# Patient Record
Sex: Male | Born: 1980 | Race: Black or African American | Hispanic: No | Smoking: Former smoker
Health system: Southern US, Community
[De-identification: ages and names within clinical notes are randomized; demographics above are authoritative.]

## PROBLEM LIST (undated history)

## (undated) DIAGNOSIS — I471 Supraventricular tachycardia, unspecified: Secondary | ICD-10-CM

## (undated) DIAGNOSIS — I4891 Unspecified atrial fibrillation: Secondary | ICD-10-CM

## (undated) DIAGNOSIS — D72819 Decreased white blood cell count, unspecified: Secondary | ICD-10-CM

## (undated) DIAGNOSIS — R519 Headache, unspecified: Secondary | ICD-10-CM

## (undated) DIAGNOSIS — Z7251 High risk heterosexual behavior: Secondary | ICD-10-CM

## (undated) DIAGNOSIS — F419 Anxiety disorder, unspecified: Secondary | ICD-10-CM

## (undated) DIAGNOSIS — R112 Nausea with vomiting, unspecified: Secondary | ICD-10-CM

## (undated) DIAGNOSIS — K449 Diaphragmatic hernia without obstruction or gangrene: Secondary | ICD-10-CM

## (undated) DIAGNOSIS — F329 Major depressive disorder, single episode, unspecified: Secondary | ICD-10-CM

## (undated) DIAGNOSIS — F410 Panic disorder [episodic paroxysmal anxiety] without agoraphobia: Secondary | ICD-10-CM

## (undated) DIAGNOSIS — K219 Gastro-esophageal reflux disease without esophagitis: Secondary | ICD-10-CM

## (undated) DIAGNOSIS — F32A Depression, unspecified: Secondary | ICD-10-CM

## (undated) DIAGNOSIS — D432 Neoplasm of uncertain behavior of brain, unspecified: Secondary | ICD-10-CM

## (undated) DIAGNOSIS — R5383 Other fatigue: Secondary | ICD-10-CM

## (undated) DIAGNOSIS — R5381 Other malaise: Secondary | ICD-10-CM

## (undated) DIAGNOSIS — R109 Unspecified abdominal pain: Secondary | ICD-10-CM

## (undated) DIAGNOSIS — Z9889 Other specified postprocedural states: Secondary | ICD-10-CM

## (undated) HISTORY — DX: Other fatigue: R53.83

## (undated) HISTORY — DX: Unspecified abdominal pain: R10.9

## (undated) HISTORY — DX: Other specified postprocedural states: R11.2

## (undated) HISTORY — DX: Nausea with vomiting, unspecified: Z98.890

## (undated) HISTORY — DX: Decreased white blood cell count, unspecified: D72.819

## (undated) HISTORY — DX: Other malaise: R53.81

## (undated) HISTORY — DX: High risk heterosexual behavior: Z72.51

## (undated) HISTORY — DX: Supraventricular tachycardia, unspecified: I47.10

## (undated) HISTORY — DX: Anxiety disorder, unspecified: F41.9

## (undated) HISTORY — DX: Neoplasm of uncertain behavior of brain, unspecified: D43.2

## (undated) HISTORY — DX: Depression, unspecified: F32.A

## (undated) HISTORY — DX: Major depressive disorder, single episode, unspecified: F32.9

## (undated) HISTORY — DX: Panic disorder (episodic paroxysmal anxiety): F41.0

## (undated) HISTORY — DX: Headache, unspecified: R51.9

## (undated) HISTORY — DX: Supraventricular tachycardia: I47.1

---

## 2003-07-23 ENCOUNTER — Emergency Department (HOSPITAL_COMMUNITY): Admission: EM | Admit: 2003-07-23 | Discharge: 2003-07-23 | Payer: Self-pay | Admitting: Emergency Medicine

## 2005-05-10 ENCOUNTER — Emergency Department (HOSPITAL_COMMUNITY): Admission: EM | Admit: 2005-05-10 | Discharge: 2005-05-10 | Payer: Self-pay | Admitting: Emergency Medicine

## 2005-05-10 ENCOUNTER — Ambulatory Visit: Payer: Self-pay | Admitting: Internal Medicine

## 2005-07-12 ENCOUNTER — Emergency Department (HOSPITAL_COMMUNITY): Admission: EM | Admit: 2005-07-12 | Discharge: 2005-07-12 | Payer: Self-pay | Admitting: Family Medicine

## 2006-01-19 ENCOUNTER — Ambulatory Visit: Payer: Self-pay | Admitting: Internal Medicine

## 2006-01-26 ENCOUNTER — Ambulatory Visit: Payer: Self-pay | Admitting: Gastroenterology

## 2006-07-16 ENCOUNTER — Emergency Department (HOSPITAL_COMMUNITY): Admission: EM | Admit: 2006-07-16 | Discharge: 2006-07-16 | Payer: Self-pay | Admitting: Emergency Medicine

## 2006-07-21 ENCOUNTER — Emergency Department (HOSPITAL_COMMUNITY): Admission: EM | Admit: 2006-07-21 | Discharge: 2006-07-21 | Payer: Self-pay | Admitting: Emergency Medicine

## 2006-08-07 ENCOUNTER — Emergency Department (HOSPITAL_COMMUNITY): Admission: EM | Admit: 2006-08-07 | Discharge: 2006-08-07 | Payer: Self-pay | Admitting: *Deleted

## 2008-03-11 ENCOUNTER — Ambulatory Visit (HOSPITAL_COMMUNITY): Admission: RE | Admit: 2008-03-11 | Discharge: 2008-03-11 | Payer: Self-pay | Admitting: Emergency Medicine

## 2008-03-11 ENCOUNTER — Emergency Department (HOSPITAL_COMMUNITY): Admission: EM | Admit: 2008-03-11 | Discharge: 2008-03-11 | Payer: Self-pay | Admitting: Emergency Medicine

## 2009-07-26 ENCOUNTER — Emergency Department (HOSPITAL_COMMUNITY): Admission: EM | Admit: 2009-07-26 | Discharge: 2009-07-26 | Payer: Self-pay | Admitting: Emergency Medicine

## 2009-08-07 ENCOUNTER — Emergency Department (HOSPITAL_COMMUNITY): Admission: EM | Admit: 2009-08-07 | Discharge: 2009-08-07 | Payer: Self-pay | Admitting: Emergency Medicine

## 2010-12-25 ENCOUNTER — Encounter: Payer: Self-pay | Admitting: Internal Medicine

## 2011-04-22 NOTE — Assessment & Plan Note (Signed)
[facility]                                   ON-CALL NOTE   Anthony Henderson, Anthony Henderson                       MRN:          [id number]  DATE:08/07/2006                            DOB:          1981-05-24    OUTPATIENT ON CALL NOTE:  Date of interaction August 07, 2006 at 8:23 a.m.  Phone number is [phone] and the caller was Anthony Henderson who is the mother,  although I actually spoke to Anthony Henderson as well.  Patient has nausea,  vomiting, diarrhea.  Has had that since late last night.  Now having green  fluid minimally from both vomiting and diarrhea.  Feels bad, has mild  fevers.  Ate what he thinks was an old pie last night.  Took Phenergan a  little while ago without affect.   ASSESSMENT:  Possible dehydration with probably gastroenteritis.   PLAN:  Feel he probably ought to be seen in the emergency room to be  evaluated and may need intravenous fluids and certainly will need at least  internal medicine if not intravenous medication for nausea.   PRIMARY CARE PHYSICIAN:  Dr.  Alwyn Henderson.  Home office is [place].                                   Arta Silence, MD   RNS/MedQ  DD:  08/07/2006  DT:  08/07/2006  Job #:  [id number]   cc:   Anthony Henderson. Anthony Ren, MD,FACP,FCCP

## 2011-05-10 ENCOUNTER — Inpatient Hospital Stay (INDEPENDENT_AMBULATORY_CARE_PROVIDER_SITE_OTHER)
Admission: RE | Admit: 2011-05-10 | Discharge: 2011-05-10 | Disposition: A | Discharge status: Home / Self Care | Payer: Self-pay | Source: Ambulatory Visit | Attending: Family Medicine | Admitting: Family Medicine

## 2011-05-10 DIAGNOSIS — J029 Acute pharyngitis, unspecified: Secondary | ICD-10-CM

## 2011-05-10 LAB — POCT RAPID STREP A: Streptococcus, Group A Screen (Direct): NEGATIVE

## 2012-01-09 ENCOUNTER — Emergency Department (HOSPITAL_COMMUNITY)
Admission: EM | Admit: 2012-01-09 | Discharge: 2012-01-09 | Disposition: A | Discharge status: Home / Self Care | Payer: Self-pay | Attending: Emergency Medicine | Admitting: Emergency Medicine

## 2012-01-09 ENCOUNTER — Encounter (HOSPITAL_COMMUNITY): Payer: Self-pay

## 2012-01-09 ENCOUNTER — Other Ambulatory Visit: Payer: Self-pay

## 2012-01-09 ENCOUNTER — Emergency Department (HOSPITAL_COMMUNITY): Payer: Self-pay

## 2012-01-09 DIAGNOSIS — R6883 Chills (without fever): Secondary | ICD-10-CM | POA: Insufficient documentation

## 2012-01-09 DIAGNOSIS — R002 Palpitations: Secondary | ICD-10-CM | POA: Insufficient documentation

## 2012-01-09 DIAGNOSIS — F17200 Nicotine dependence, unspecified, uncomplicated: Secondary | ICD-10-CM | POA: Insufficient documentation

## 2012-01-09 DIAGNOSIS — R0789 Other chest pain: Secondary | ICD-10-CM | POA: Insufficient documentation

## 2012-01-09 DIAGNOSIS — I471 Supraventricular tachycardia: Secondary | ICD-10-CM

## 2012-01-09 DIAGNOSIS — I498 Other specified cardiac arrhythmias: Secondary | ICD-10-CM | POA: Insufficient documentation

## 2012-01-09 LAB — CBC
MCH: 30.5 pg (ref 26.0–34.0)
MCV: 88.5 fL (ref 78.0–100.0)
Platelets: 232 10*3/uL (ref 150–400)
RBC: 4.79 MIL/uL (ref 4.22–5.81)
RDW: 11.7 % (ref 11.5–15.5)

## 2012-01-09 LAB — DIFFERENTIAL
Eosinophils Absolute: 0 10*3/uL (ref 0.0–0.7)
Eosinophils Relative: 1 % (ref 0–5)
Lymphs Abs: 1.1 10*3/uL (ref 0.7–4.0)

## 2012-01-09 LAB — POCT I-STAT, CHEM 8
Creatinine, Ser: 0.9 mg/dL (ref 0.50–1.35)
Hemoglobin: 15 g/dL (ref 13.0–17.0)
Sodium: 139 mEq/L (ref 135–145)
TCO2: 24 mmol/L (ref 0–100)

## 2012-01-09 MED ORDER — LORAZEPAM 1 MG PO TABS: 1.0000 mg | ORAL_TABLET | Freq: Three times a day (TID) | ORAL | Status: AC | PRN

## 2012-01-09 MED ORDER — FENTANYL CITRATE 0.05 MG/ML IJ SOLN
50.0000 ug | Freq: Once | INTRAMUSCULAR | Status: AC
  Administered 2012-01-09: 50 ug via INTRAVENOUS
  Filled 2012-01-09 (×2): qty 2

## 2012-01-09 MED ORDER — ONDANSETRON HCL 4 MG/2ML IJ SOLN
4.0000 mg | Freq: Once | INTRAMUSCULAR | Status: AC
  Administered 2012-01-09: 4 mg via INTRAVENOUS
  Filled 2012-01-09: qty 2

## 2012-01-09 NOTE — ED Notes (Signed)
Pt advised of plan at discharge. No complaints at present except feeling a bit anxious. meds given. Pt given snack and drink at discharge. Ambulatory out of dept.

## 2012-01-09 NOTE — ED Provider Notes (Signed)
History     CSN: [id number]  Arrival date & time 01/09/12  1330   First MD Initiated Contact with Patient 01/09/12 1348      3:11 PM HPI Patient reports he was working on his car when he suddenly became very anxious and his heart began to beat extremely fast. States he tried to smoke cigarettes to relief of his anxiety. Reports he was unable to control his heart rate with relaxation. States having intermittent episodes of palpitations in the past but they would usually resolve after trying to relax. States this  episode was persistent and began to cause chest discomfort. States his chest discomfort resolved and palpitations are decreasing. States he continues to have chills and shakes. Denies shortness of breath. Denies history of known heart disease, sudden death at an early age in family, hypertension, hyperlipidemia, diabetes. Patient is a 31 y.o. male presenting with palpitations. The history is provided by the patient.  Palpitations  This is a new problem. The current episode started 1 to 2 hours ago. The problem occurs constantly. The problem has been gradually improving. The problem is associated with an unknown factor. Associated symptoms include chest pain. Pertinent negatives include no diaphoresis, no fever, no numbness, no abdominal pain, no nausea, no vomiting, no headaches, no back pain, no dizziness, no weakness, no cough and no shortness of breath. He has tried nothing for the symptoms.    No past medical history on file.  No past surgical history on file.  No family history on file.  History  Substance Use Topics  . Smoking status: Current Everyday Smoker  . Smokeless tobacco: Not on file  . Alcohol Use:       Review of Systems  Constitutional: Positive for chills. Negative for fever, diaphoresis and fatigue.  HENT: Negative for sore throat.   Respiratory: Negative for cough and shortness of breath.   Cardiovascular: Positive for chest pain and palpitations.    Gastrointestinal: Negative for nausea, vomiting and abdominal pain.  Musculoskeletal: Negative for back pain.  Neurological: Negative for dizziness, weakness, numbness and headaches.  Psychiatric/Behavioral: Positive for agitation.  All other systems reviewed and are negative.    Allergies  Penicillins  Home Medications  No current outpatient prescriptions on file.  BP 121/89  Pulse 148  Temp(Src) 98.3 F (36.8 C) (Oral)  Resp 20  SpO2 99%  Physical Exam  Constitutional: He is oriented to person, place, and time. He appears well-developed and well-nourished.  HENT:  Head: Normocephalic and atraumatic.  Eyes: Conjunctivae are normal. Pupils are equal, round, and reactive to light.  Neck: Normal range of motion. Neck supple.  Cardiovascular: Regular rhythm and normal heart sounds.  Tachycardia present.  Exam reveals no gallop and no friction rub.   No murmur heard. Pulmonary/Chest: Effort normal and breath sounds normal. He has no wheezes. He has no rales. He exhibits no tenderness.  Abdominal: Soft. Bowel sounds are normal.  Neurological: He is alert and oriented to person, place, and time.  Skin: Skin is warm and dry. No rash noted. No erythema. No pallor.  Psychiatric: His behavior is normal. His mood appears anxious.    ED Course  Procedures  Results for orders placed during the hospital encounter of 01/09/12  CBC      Component Value Range   WBC 3.9 (*) 4.0 - 10.5 (K/uL)   RBC 4.79  4.22 - 5.81 (MIL/uL)   Hemoglobin 14.6  13.0 - 17.0 (g/dL)   HCT 08.6  57.8 -  52.0 (%)   MCV 88.5  78.0 - 100.0 (fL)   MCH 30.5  26.0 - 34.0 (pg)   MCHC 34.4  30.0 - 36.0 (g/dL)   RDW 40.9  81.1 - 91.4 (%)   Platelets 232  150 - 400 (K/uL)  DIFFERENTIAL      Component Value Range   Neutrophils Relative 64  43 - 77 (%)   Neutro Abs 2.5  1.7 - 7.7 (K/uL)   Lymphocytes Relative 29  12 - 46 (%)   Lymphs Abs 1.1  0.7 - 4.0 (K/uL)   Monocytes Relative 6  3 - 12 (%)   Monocytes  Absolute 0.3  0.1 - 1.0 (K/uL)   Eosinophils Relative 1  0 - 5 (%)   Eosinophils Absolute 0.0  0.0 - 0.7 (K/uL)   Basophils Relative 1  0 - 1 (%)   Basophils Absolute 0.0  0.0 - 0.1 (K/uL)  POCT I-STAT, CHEM 8      Component Value Range   Sodium 139  135 - 145 (mEq/L)   Potassium 3.8  3.5 - 5.1 (mEq/L)   Chloride 104  96 - 112 (mEq/L)   BUN 10  6 - 23 (mg/dL)   Creatinine, Ser 7.82  0.50 - 1.35 (mg/dL)   Glucose, Bld 956 (*) 70 - 99 (mg/dL)   Calcium, Ion 2.13  0.86 - 1.32 (mmol/L)   TCO2 24  0 - 100 (mmol/L)   Hemoglobin 15.0  13.0 - 17.0 (g/dL)   HCT 57.8  46.9 - 62.9 (%)   Dg Chest 2 View  01/09/2012  *RADIOLOGY REPORT*  Clinical Data: Chest pain.  Tachycardia.  CHEST - 2 VIEW  Comparison: 03/11/2008  Findings: Heart size is normal.  Mediastinal shadows are normal. Lungs are clear.  The vascularity is normal.  No effusions.  No significant bony finding. Minimal spinal curvature.  IMPRESSION: No active disease  Original Report Authenticated By: Thomasenia Sales, M.D.    ED ECG REPORT   Date: 01/09/2012  EKG Time: 4:19 PM  Rate: 145  Rhythm: supraventricular tachycardia (SVT)  Axis: NML  Intervals:none  ST&T Change: NMl  Narrative Interpretation: SVT    4:19 PM  rechecked rhythm on monitor indicates NML sinus. BP 116/89, PR 70, O2 sats 100%       MDM  Discussed with family and patient that we have evaluated for several abnormalities that could cause symptoms. Differential diagnosis included cardiac disease, pneumothorax, aortic dissection, pneumonia. Advised patient to family that these appear to be ruled out however did recommend close followup since SVTs needs further evaluation.  Labs within normal limits. Vital signs improved no longer tachycardic. no longer symptomatic. Discussed with patient and mother differential diagnoses and what we have ruled out a. Advised close followup with cardiologist this week for further evaluation of SVT. Advised immediate return to the  emergency department for worsening symptoms. Patient and family agree with this and are ready for discharge.   4:29 PM Spoke with Dr. Mayford Knife in passing who quickly reviewed EKG and case for me. Recommends f/u with [facility] and suggested Dr. Ladona Ridgel.  Thomasene Lot, PA-C 01/10/12 [phone]

## 2012-01-09 NOTE — ED Notes (Signed)
Pt complains of 30 min onset of chest pain and heart racing, chills, and shaking.

## 2012-01-12 NOTE — ED Provider Notes (Signed)
Medical screening examination/treatment/procedure(s) were performed by non-physician practitioner and as supervising physician I was immediately available for consultation/collaboration.  Geoffery Lyons, MD 01/12/12 [phone]

## 2012-02-06 ENCOUNTER — Ambulatory Visit: Payer: Self-pay | Admitting: Internal Medicine

## 2013-04-09 ENCOUNTER — Emergency Department (HOSPITAL_COMMUNITY)
Admission: EM | Admit: 2013-04-09 | Discharge: 2013-04-09 | Disposition: A | Discharge status: Home / Self Care | Payer: Medicaid - Out of State | Attending: Emergency Medicine | Admitting: Emergency Medicine

## 2013-04-09 ENCOUNTER — Encounter (HOSPITAL_COMMUNITY): Payer: Self-pay | Admitting: Emergency Medicine

## 2013-04-09 DIAGNOSIS — K029 Dental caries, unspecified: Secondary | ICD-10-CM | POA: Insufficient documentation

## 2013-04-09 DIAGNOSIS — F17200 Nicotine dependence, unspecified, uncomplicated: Secondary | ICD-10-CM | POA: Insufficient documentation

## 2013-04-09 DIAGNOSIS — K0889 Other specified disorders of teeth and supporting structures: Secondary | ICD-10-CM

## 2013-04-09 DIAGNOSIS — K089 Disorder of teeth and supporting structures, unspecified: Secondary | ICD-10-CM | POA: Insufficient documentation

## 2013-04-09 MED ORDER — CLINDAMYCIN HCL 150 MG PO CAPS: 150.0000 mg | ORAL_CAPSULE | Freq: Three times a day (TID) | ORAL | Status: DC

## 2013-04-09 MED ORDER — ONDANSETRON HCL 4 MG PO TABS: 4.0000 mg | ORAL_TABLET | Freq: Three times a day (TID) | ORAL | Status: DC | PRN

## 2013-04-09 MED ORDER — OXYCODONE-ACETAMINOPHEN 5-325 MG PO TABS: ORAL_TABLET | ORAL | Status: DC

## 2013-04-09 NOTE — ED Provider Notes (Signed)
Medical screening examination/treatment/procedure(s) were performed by non-physician practitioner and as supervising physician I was immediately available for consultation/collaboration.  Jones Skene, M.D.     Jones Skene, MD 04/09/13 [phone]

## 2013-04-09 NOTE — ED Notes (Signed)
PA at bedside.

## 2013-04-09 NOTE — ED Notes (Signed)
Pt complaining of pain on right side of mouth. Pt states he has taken pain medication, placed ice and warm packs on cheek, etc. Pt states nothing works. Pt stable. Pt states he is able to eat and drink. Pain 10/10. Pt states his tooth was pulled on the right side a few months ago. Pt states he did not take his antibiotics due to patient not liking taking meds.

## 2013-04-09 NOTE — ED Provider Notes (Signed)
History     CSN: [id number]  Arrival date & time 04/09/13  0557   None     Chief Complaint  Patient presents with  . Dental Pain    Top right side of mouth    (Consider location/radiation/quality/duration/timing/severity/associated sxs/prior treatment) HPI Comments: 32 y.o. Male with no significant PMHx presents today complaining of dental pain to the upper right where he had a tooth pulled about 2 months ago. Pt states it has been bothering him for about 2 weeks. Pain was controlled with ibuprofen, but has gradually worsened. He tried taking half a vicodin prescribed by the dentist when he had the tooth pulled, but that offered only minimal relief.  Pain rated at a 10/10, characterized as throbbing in nature. Patient denies fever, night sweats, chills, difficulty swallowing or opening mouth, SOB, nuchal rigidity or decreased ROM of neck.  Patient does not have a dentist and requests a resource guide at discharge.    Patient is a 32 y.o. male presenting with tooth pain.  Dental PainPrimary symptoms do not include headaches, fever, shortness of breath or sore throat.  Additional symptoms do not include: facial swelling, trouble swallowing and drooling.    History reviewed. No pertinent past medical history.  History reviewed. No pertinent past surgical history.  History reviewed. No pertinent family history.  History  Substance Use Topics  . Smoking status: Current Every Day Smoker  . Smokeless tobacco: Not on file  . Alcohol Use: No      Review of Systems  Constitutional: Negative for fever.  HENT: Positive for dental problem. Negative for sore throat, facial swelling, drooling, trouble swallowing, neck pain, neck stiffness and voice change.   Eyes: Negative for photophobia and visual disturbance.  Respiratory: Negative for shortness of breath.   Cardiovascular: Negative for chest pain.  Gastrointestinal: Negative for nausea and vomiting.  Musculoskeletal: Negative for  back pain.  Skin: Negative for rash.  Neurological: Negative for weakness, light-headedness, numbness and headaches.  Psychiatric/Behavioral: The patient is not nervous/anxious.     Allergies  Penicillins  Home Medications  No current outpatient prescriptions on file.  BP 121/81  Pulse 62  Temp(Src) 97.9 F (36.6 C) (Oral)  Resp 14  Ht 6' (1.829 m)  Wt 145 lb (65.772 kg)  BMI 19.66 kg/m2  SpO2 100%  Physical Exam  Nursing note and vitals reviewed. Constitutional: He is oriented to person, place, and time. He appears well-developed and well-nourished. No distress.  HENT:  Head: Normocephalic and atraumatic. No trismus in the jaw.  Mouth/Throat: Dental caries present. No dental abscesses. No tonsillar abscesses.    Generally poor dentition, dental caries, several missing teeth. Some redness and swelling at gumline at point of tenderness. No pus, no gross abscess, no bleeding.   Eyes: Conjunctivae and EOM are normal.  Neck: Normal range of motion. Neck supple.  No meningeal signs  Cardiovascular: Normal rate, regular rhythm and normal heart sounds.  Exam reveals no gallop and no friction rub.   No murmur heard. Pulmonary/Chest: Effort normal and breath sounds normal. No respiratory distress. He has no wheezes. He has no rales. He exhibits no tenderness.  Abdominal: Soft. Bowel sounds are normal. He exhibits no distension. There is no tenderness. There is no rebound and no guarding.  Musculoskeletal: Normal range of motion. He exhibits no edema and no tenderness.  Neurological: He is alert and oriented to person, place, and time. No cranial nerve deficit.  Skin: Skin is warm and dry. He is not  diaphoretic. No erythema.  Psychiatric: He has a normal mood and affect.    ED Course  Procedures (including critical care time)  Labs Reviewed - No data to display No results found.  New Prescriptions   CLINDAMYCIN (CLEOCIN) 150 MG CAPSULE    Take 1 capsule (150 mg total) by  mouth 3 (three) times daily. May dispense as 150mg  capsules   ONDANSETRON (ZOFRAN) 4 MG TABLET    Take 1 tablet (4 mg total) by mouth every 8 (eight) hours as needed for nausea.   OXYCODONE-ACETAMINOPHEN (PERCOCET) 5-325 MG PER TABLET    Take one or two tablets every 4-6 hours as needed for pain    1. Pain, dental       MDM  Patient with dental pain.  No gross abscess.  Exam unconcerning for Ludwig's angina or spread of infection.  Will treat with clindamycin (pcn allergy) and pain medicine.  Urged patient to follow-up with dentist.  Provided resource material. Discussed options for treatment with pt who understands and is in agreement with discharge plan.    Glade Nurse, [place] 04/09/13 [phone]

## 2013-04-09 NOTE — ED Notes (Signed)
Family at bedside. 

## 2013-04-12 ENCOUNTER — Encounter (HOSPITAL_COMMUNITY): Payer: Self-pay | Admitting: Adult Health

## 2013-04-12 ENCOUNTER — Emergency Department (HOSPITAL_COMMUNITY)
Admission: EM | Admit: 2013-04-12 | Discharge: 2013-04-12 | Disposition: A | Discharge status: Home / Self Care | Payer: Medicaid - Out of State | Attending: Emergency Medicine | Admitting: Emergency Medicine

## 2013-04-12 DIAGNOSIS — R110 Nausea: Secondary | ICD-10-CM | POA: Insufficient documentation

## 2013-04-12 DIAGNOSIS — F17200 Nicotine dependence, unspecified, uncomplicated: Secondary | ICD-10-CM | POA: Insufficient documentation

## 2013-04-12 DIAGNOSIS — K029 Dental caries, unspecified: Secondary | ICD-10-CM | POA: Insufficient documentation

## 2013-04-12 DIAGNOSIS — G8929 Other chronic pain: Secondary | ICD-10-CM | POA: Insufficient documentation

## 2013-04-12 DIAGNOSIS — Z880 Allergy status to penicillin: Secondary | ICD-10-CM | POA: Insufficient documentation

## 2013-04-12 DIAGNOSIS — K089 Disorder of teeth and supporting structures, unspecified: Secondary | ICD-10-CM | POA: Insufficient documentation

## 2013-04-12 DIAGNOSIS — K0889 Other specified disorders of teeth and supporting structures: Secondary | ICD-10-CM

## 2013-04-12 MED ORDER — BUPIVACAINE-EPINEPHRINE PF 0.5-1:200000 % IJ SOLN
1.8000 mL | Freq: Once | INTRAMUSCULAR | Status: AC
  Administered 2013-04-12: 0.75 mg

## 2013-04-12 NOTE — ED Notes (Signed)
Presents with 2-3 weeks of dental pain from a tooth extraction. Requesting numbing shot to get him through the eevning. Oral pain medication is not helping/

## 2013-04-12 NOTE — ED Provider Notes (Signed)
History    This chart was scribed for non-physician practitioner Anthony Henderson working with Anthony Human, MD by Quintella Reichert, ED Scribe. This patient was seen in room TR06C/TR06C and the patient's care was started at 10:52 PM .   CSN: [id number]  Arrival date & time 04/12/13  2022       Chief Complaint  Patient presents with  . Dental Pain     Patient is a 32 y.o. male presenting with tooth pain. The history is provided by the patient. No language interpreter was used.  Dental PainPrimary symptoms do not include fever.    HPI Comments: Anthony Henderson is a 32 y.o. male who presents to the Emergency Department complaining of constant, moderate, gradually-worsening upper right dental pain that began 2-3 weeks ago in the area of a tooth extraction.  Pt states he had a tooth extracted 2 months ago, and that subsequent pain was controlled with ibuprofen for over a month before gradually becoming more severe.  In the past 3 weeks he has tried Advil, Vicodin and Percocet, all of which failed to relieve pain but induced mild nausea.  Pt states pain is not relieved by anything he has tried.  He has an appointment with his oral surgeon on Monday, and is asking for a numbing shot to help him get through the next few days.  He does not want further pain medication.  Pt denies fevers, chills, emesis, or any other associated symptoms.  He is on clindamycin for possible dental infection.   History reviewed. No pertinent past medical history.  History reviewed. No pertinent past surgical history.  History reviewed. No pertinent family history.  History  Substance Use Topics  . Smoking status: Current Every Day Smoker  . Smokeless tobacco: Not on file  . Alcohol Use: No      Review of Systems  Constitutional: Negative for fever and chills.  HENT: Positive for dental problem.   Gastrointestinal: Positive for nausea. Negative for vomiting.  All other systems reviewed and are  negative.    Allergies  Penicillins  Home Medications   Current Outpatient Rx  Name  Route  Sig  Dispense  Refill  . clindamycin (CLEOCIN) 150 MG capsule   Oral   Take 150 mg by mouth 3 (three) times daily. Start date 04/09/13; Duration unknown         . HYDROcodone-acetaminophen (VICODIN) 5-500 MG per tablet   Oral   Take 1 tablet by mouth every 6 (six) hours as needed for pain.         Marland Kitchen ibuprofen (ADVIL,MOTRIN) 200 MG tablet   Oral   Take 200 mg by mouth every 6 (six) hours as needed for pain.         Marland Kitchen ondansetron (ZOFRAN) 4 MG tablet   Oral   Take 4 mg by mouth every 8 (eight) hours as needed for nausea.         Marland Kitchen oxyCODONE-acetaminophen (PERCOCET/ROXICET) 5-325 MG per tablet   Oral   Take 1-2 tablets by mouth every 4 (four) hours as needed for pain.           BP 114/77  Pulse 60  Temp(Src) 98.2 F (36.8 C) (Oral)  Resp 18  SpO2 99%  Physical Exam  Nursing note and vitals reviewed. Constitutional: He is oriented to person, place, and time. He appears well-developed and well-nourished.  HENT:  Head: Normocephalic.  Right Ear: Tympanic membrane, external ear and ear canal normal.  Left  Ear: Tympanic membrane, external ear and ear canal normal.  Nose: Nose normal. Right sinus exhibits no maxillary sinus tenderness and no frontal sinus tenderness. Left sinus exhibits no maxillary sinus tenderness and no frontal sinus tenderness.  Mouth/Throat: Uvula is midline, oropharynx is clear and moist and mucous membranes are normal. No oral lesions. Abnormal dentition. Dental caries present. No edematous or lacerations. No oropharyngeal exudate, posterior oropharyngeal edema, posterior oropharyngeal erythema or tonsillar abscesses.    Tooth # 3 has been extracted, no erythema, induration, or area of fluctuance  Eyes: Conjunctivae are normal. Pupils are equal, round, and reactive to light. Right eye exhibits no discharge. Left eye exhibits no discharge.  Neck:  Normal range of motion. Neck supple.  Cardiovascular: Normal rate, regular rhythm, normal heart sounds and intact distal pulses.   No murmur heard. Pulmonary/Chest: Effort normal and breath sounds normal. No respiratory distress. He has no wheezes.  Abdominal: Soft. Bowel sounds are normal. He exhibits no distension. There is no tenderness.  Lymphadenopathy:    He has no cervical adenopathy.  Neurological: He is alert and oriented to person, place, and time. No cranial nerve deficit.  Skin: Skin is warm and dry. No erythema.  Psychiatric: He has a normal mood and affect.    ED Course  Dental Date/Time: 04/12/2013 11:26 PM Performed by: Dierdre Forth Authorized by: Dierdre Forth Consent: Verbal consent obtained. Risks and benefits: risks, benefits and alternatives were discussed Consent given by: patient and parent Patient understanding: patient states understanding of the procedure being performed Patient consent: the patient's understanding of the procedure matches consent given Procedure consent: procedure consent matches procedure scheduled Relevant documents: relevant documents present and verified Site marked: the operative site was marked Required items: required blood products, implants, devices, and special equipment available Patient identity confirmed: verbally with patient and arm band Time out: Immediately prior to procedure a "time out" was called to verify the correct patient, procedure, equipment, support staff and site/side marked as required. Preparation: Patient was prepped and draped in the usual sterile fashion. Local anesthesia used: yes Anesthesia: local infiltration Local anesthetic: bupivacaine 0.5% with epinephrine Anesthetic total: 0.75 ml Patient sedated: no Patient tolerance: Patient tolerated the procedure well with no immediate complications. Comments: Dental block of Tooth #3 without complication   (including critical care  time)  DIAGNOSTIC STUDIES: Oxygen Saturation is 99% on room air, normal by my interpretation.    COORDINATION OF CARE: 10:56 PM-Discussed treatment plan which includes dental block with pt at bedside and pt agreed to plan.    11:01 PM: Administered dental block.  Pt states that pain is relieved slightly.  Labs Reviewed - No data to display No results found.   1. Pain, dental   2. Chronic dental pain       MDM  DEMARI GALES presents with chronic pain.  Patient with toothache.  No gross abscess.  Exam unconcerning for Ludwig's angina or spread of infection.  Pt is already taking clindamycin for possible infection.  Urged patient to keep his appointment with the oral surgeon. Pt pain relieved with dental block.  I have also discussed reasons to return immediately to the ER.  Patient expresses understanding and agrees with plan.   I personally performed the services described in this documentation, which was scribed in my presence. The recorded information has been reviewed and is accurate.   Anthony Client Kattia Selley, PA-C 04/12/13 2330

## 2013-04-13 NOTE — ED Provider Notes (Signed)
Medical screening examination/treatment/procedure(s) were performed by non-physician practitioner and as supervising physician I was immediately available for consultation/collaboration.   Shantay Sonn III, MD 04/13/13 1259 

## 2013-04-23 ENCOUNTER — Emergency Department (INDEPENDENT_AMBULATORY_CARE_PROVIDER_SITE_OTHER)
Admission: EM | Admit: 2013-04-23 | Discharge: 2013-04-23 | Disposition: A | Discharge status: Home / Self Care | Payer: Medicaid - Out of State | Source: Home / Self Care | Attending: Family Medicine | Admitting: Family Medicine

## 2013-04-23 ENCOUNTER — Encounter (HOSPITAL_COMMUNITY): Payer: Self-pay | Admitting: Emergency Medicine

## 2013-04-23 DIAGNOSIS — F430 Acute stress reaction: Secondary | ICD-10-CM

## 2013-04-23 DIAGNOSIS — R4589 Other symptoms and signs involving emotional state: Secondary | ICD-10-CM

## 2013-04-23 MED ORDER — CLONAZEPAM 1 MG PO TABS: 1.0000 mg | ORAL_TABLET | Freq: Two times a day (BID) | ORAL | Status: DC | PRN

## 2013-04-23 NOTE — ED Provider Notes (Signed)
History     CSN: [id number]  Arrival date & time 04/23/13  1632   First MD Initiated Contact with Patient 04/23/13 1734      No chief complaint on file.   (Consider location/radiation/quality/duration/timing/severity/associated sxs/prior treatment) Patient is a 32 y.o. male presenting with anxiety. The history is provided by the patient and a parent.  Anxiety This is a chronic problem. The current episode started yesterday. The problem has been gradually improving. Associated symptoms include shortness of breath. Associated symptoms comments: S/p dental extraction on fri , worried about infection in his body. Having palp and sob and sweats, h/o panic disorder..    No past medical history on file.  No past surgical history on file.  No family history on file.  History  Substance Use Topics  . Smoking status: Current Every Day Smoker  . Smokeless tobacco: Not on file  . Alcohol Use: No      Review of Systems  Constitutional: Negative.   HENT: Negative.   Respiratory: Positive for shortness of breath.   Cardiovascular: Positive for palpitations.  Gastrointestinal: Negative.   Genitourinary: Negative.     Allergies  Penicillins  Home Medications   Current Outpatient Rx  Name  Route  Sig  Dispense  Refill  . clindamycin (CLEOCIN) 150 MG capsule   Oral   Take 150 mg by mouth 3 (three) times daily. Start date 04/09/13; Duration unknown         . clonazePAM (KLONOPIN) 1 MG tablet   Oral   Take 1 tablet (1 mg total) by mouth 2 (two) times daily as needed for anxiety.   10 tablet   0   . HYDROcodone-acetaminophen (VICODIN) 5-500 MG per tablet   Oral   Take 1 tablet by mouth every 6 (six) hours as needed for pain.         Marland Kitchen ibuprofen (ADVIL,MOTRIN) 200 MG tablet   Oral   Take 200 mg by mouth every 6 (six) hours as needed for pain.         Marland Kitchen ondansetron (ZOFRAN) 4 MG tablet   Oral   Take 4 mg by mouth every 8 (eight) hours as needed for nausea.          Marland Kitchen oxyCODONE-acetaminophen (PERCOCET/ROXICET) 5-325 MG per tablet   Oral   Take 1-2 tablets by mouth every 4 (four) hours as needed for pain.           BP 106/80  Pulse 82  Temp(Src) 98.2 F (36.8 C) (Oral)  Resp 16  SpO2 100%  Physical Exam  Nursing note and vitals reviewed. Constitutional: He is oriented to person, place, and time. He appears well-developed and well-nourished.  HENT:  Head: Normocephalic.  Right Ear: External ear normal.  Left Ear: External ear normal.  Mouth/Throat: Oropharynx is clear and moist.  Neck: Normal range of motion. Neck supple.  Cardiovascular: Normal rate and normal heart sounds.   Pulmonary/Chest: Effort normal and breath sounds normal. No respiratory distress. He has no wheezes.  Neurological: He is alert and oriented to person, place, and time.  Skin: Skin is warm and dry.  Psychiatric: He has a normal mood and affect. His behavior is normal. Judgment and thought content normal.    ED Course  Procedures (including critical care time)  Labs Reviewed - No data to display No results found.   1. Panic attack as reaction to stress       MDM  Linna Hoff, MD 04/23/13 [phone]

## 2013-04-23 NOTE — ED Notes (Signed)
Patient states he had tooth extracted last Friday. Last night he felt anxious like his heart was racing. Today he feels generalized weakness. Also wants to be checked for infection. No symptoms of infection. Just feels weak and tired. Patient is alert and oriented.

## 2013-05-02 ENCOUNTER — Encounter: Payer: Self-pay | Admitting: Family Medicine

## 2013-05-02 ENCOUNTER — Telehealth: Payer: Self-pay | Admitting: Family Medicine

## 2013-05-02 ENCOUNTER — Ambulatory Visit: Payer: Medicaid - Out of State | Attending: Family Medicine | Admitting: Family Medicine

## 2013-05-02 VITALS — BP 115/81 | HR 101 | Temp 98.4°F | Resp 19 | Wt 131.6 lb

## 2013-05-02 DIAGNOSIS — Z7251 High risk heterosexual behavior: Secondary | ICD-10-CM | POA: Insufficient documentation

## 2013-05-02 DIAGNOSIS — Z0000 Encounter for general adult medical examination without abnormal findings: Secondary | ICD-10-CM | POA: Insufficient documentation

## 2013-05-02 DIAGNOSIS — R5381 Other malaise: Secondary | ICD-10-CM | POA: Insufficient documentation

## 2013-05-02 DIAGNOSIS — Z833 Family history of diabetes mellitus: Secondary | ICD-10-CM | POA: Insufficient documentation

## 2013-05-02 DIAGNOSIS — F411 Generalized anxiety disorder: Secondary | ICD-10-CM | POA: Insufficient documentation

## 2013-05-02 LAB — CBC WITH DIFFERENTIAL/PLATELET
Basophils Absolute: 0 10*3/uL (ref 0.0–0.1)
Basophils Relative: 1 % (ref 0–1)
Eosinophils Absolute: 0 10*3/uL (ref 0.0–0.7)
Lymphs Abs: 1.9 10*3/uL (ref 0.7–4.0)
MCH: 29.8 pg (ref 26.0–34.0)
MCHC: 34.8 g/dL (ref 30.0–36.0)
Neutrophils Relative %: 40 % — ABNORMAL LOW (ref 43–77)
Platelets: 284 10*3/uL (ref 150–400)
RBC: 5.1 MIL/uL (ref 4.22–5.81)
RDW: 12.2 % (ref 11.5–15.5)

## 2013-05-02 LAB — HEMOGLOBIN A1C: Mean Plasma Glucose: 123 mg/dL — ABNORMAL HIGH (ref <117)

## 2013-05-02 LAB — COMPREHENSIVE METABOLIC PANEL
ALT: 9 U/L (ref 0–53)
Alkaline Phosphatase: 46 U/L (ref 39–117)
Sodium: 137 mEq/L (ref 135–145)
Total Bilirubin: 0.8 mg/dL (ref 0.3–1.2)
Total Protein: 7.5 g/dL (ref 6.0–8.3)

## 2013-05-02 LAB — RPR

## 2013-05-02 MED ORDER — FLUOXETINE HCL 20 MG PO TABS: 20.0000 mg | ORAL_TABLET | Freq: Every day | ORAL | Status: DC

## 2013-05-02 NOTE — Progress Notes (Signed)
Patient here to establish care Suffers from panic attacks Was seen last week at the urgent care

## 2013-05-02 NOTE — Progress Notes (Signed)
Subjective:     Patient ID: Anthony Henderson, male   DOB: 1981/01/27, 32 y.o.   MRN: [id number]  HPI Pt here to establish care. He has not had a pcp in over 10 years. He got married last October and wants to be healthy for his new family. He had been seen several times in UC and the ED for dental pain but that is resolved.  He has a h/o anxiety and has been taking klonopin for 1 week (total of 4 pills) but read about it and is worried about addiction.   He has a family history of DM2 although he himself has never been checked. He denies polyuria/polydipsia.   He feels fatigue more than he feels he should. He says sometimes the anxiety keeps him up and he is not able to sleep well.   He has had no other pertners besides his wife since they married, but he had several prior to her and never had an std check after that and before her. He has no penile d/c, sore, or other physical cause for concern.    Review of Systems per hpi     Objective:   Physical Exam  Nursing note and vitals reviewed. Constitutional: He appears well-developed and well-nourished.  HENT:  Head: Normocephalic.  Mouth/Throat: Oropharynx is clear and moist.  Cardiovascular: Normal rate, regular rhythm and normal heart sounds.   Pulmonary/Chest: Effort normal and breath sounds normal.  Abdominal: Soft.  Skin: Skin is warm.  Psychiatric: He has a normal mood and affect.       Assessment:     High risk sexual behavior - Plan: RPR, HIV Antibody, Acute Hep Panel & Hep B Surface Ab, GC/chlamydia probe amp, urine  Other malaise and fatigue - Plan: CBC with Differential, Comprehensive metabolic panel, TSH, Vitamin B12, Testosterone  Generalized anxiety disorder - Plan: FLUoxetine (PROZAC) 20 MG tablet  Family history of diabetes mellitus - Plan: Comprehensive metabolic panel, Hemoglobin A1c       Plan:     1. High risk sex beh - std check today, will f/u results, treat as indicated. Urged him to encourage wife to  also get checked.   2 - fatigue - labs as above  3 - GAD - d/c klonopin, start a trial of prozac. rtc 1 week to f/u on this. Discussed possible side effects including SI - will go to ED if severe.   4 - FH DM - a1c today  Pt to rtc 1 week to f/u on GAD with prozac and discuss lab results.

## 2013-05-02 NOTE — Patient Instructions (Signed)
Fluoxetine capsules or tablets (Depression/Mood Disorders) What is this medicine? FLUOXETINE (floo OX e teen) belongs to a class of drugs known as selective serotonin reuptake inhibitors (SSRIs). It helps to treat mood problems such as depression, obsessive compulsive disorder, and panic attacks. It can also treat certain eating disorders. This medicine may be used for other purposes; ask your health care provider or pharmacist if you have questions. What should I tell my health care provider before I take this medicine? They need to know if you have any of these conditions: -bipolar disorder or mania -diabetes -glaucoma -liver disease -psychosis -seizures -suicidal thoughts or history of attempted suicide -an unusual or allergic reaction to fluoxetine, other medicines, foods, dyes, or preservatives -pregnant or trying to get pregnant -breast-feeding How should I use this medicine? Take this medicine by mouth with a glass of water. Follow the directions on the prescription label. You can take this medicine with or without food. Take your medicine at regular intervals. Do not take it more often than directed. Do not stop taking this medicine suddenly except upon the advice of your doctor. Stopping this medicine too quickly may cause serious side effects or your condition may worsen. A special MedGuide will be given to you by the pharmacist with each prescription and refill. Be sure to read this information carefully each time. Talk to your pediatrician regarding the use of this medicine in children. While this drug may be prescribed for children as young as 7 years for selected conditions, precautions do apply. Overdosage: If you think you have taken too much of this medicine contact a poison control center or emergency room at once. NOTE: This medicine is only for you. Do not share this medicine with others. What if I miss a dose? If you miss a dose, skip the missed dose and go back to your  regular dosing schedule. Do not take double or extra doses. What may interact with this medicine? Do not take fluoxetine with any of the following medications: -other medicines containing fluoxetine, like Sarafem or Symbyax -cisapride -linezolid -MAOIs like Carbex, Eldepryl, Marplan, Nardil, and Parnate -methylene blue (injected into a vein) -pimozide -thioridazine This medicine may also interact with the following medications: -alcohol -aspirin and aspirin-like medicines -carbamazepine -certain medicines for depression, anxiety, or psychotic disturbances -certain medicines for migraine headaches like almotriptan, eletriptan, frovatriptan, naratriptan, rizatriptan, sumatriptan, zolmitriptan -digoxin -diuretics -fentanyl -flecainide -furazolidone -isoniazid -lithium -medicines for sleep -medicines that treat or prevent blood clots like warfarin, enoxaparin, and dalteparin -NSAIDs, medicines for pain and inflammation, like ibuprofen or naproxen -phenytoin -procarbazine -propafenone -rasagiline -ritonavir -supplements like St. John's wort, kava kava, valerian -tramadol -tryptophan -vinblastine This list may not describe all possible interactions. Give your health care provider a list of all the medicines, herbs, non-prescription drugs, or dietary supplements you use. Also tell them if you smoke, drink alcohol, or use illegal drugs. Some items may interact with your medicine. What should I watch for while using this medicine? Tell your doctor if your symptoms do not get better or if they get worse. Visit your doctor or health care professional for regular checks on your progress. Because it may take several weeks to see the full effects of this medicine, it is important to continue your treatment as prescribed by your doctor. Patients and their families should watch out for new or worsening thoughts of suicide or depression. Also watch out for sudden changes in feelings such as  feeling anxious, agitated, panicky, irritable, hostile, aggressive, impulsive, severely restless,   overly excited and hyperactive, or not being able to sleep. If this happens, especially at the beginning of treatment or after a change in dose, call your health care professional. Anthony Henderson may get drowsy or dizzy. Do not drive, use machinery, or do anything that needs mental alertness until you know how this medicine affects you. Do not stand or sit up quickly, especially if you are an older patient. This reduces the risk of dizzy or fainting spells. Alcohol may interfere with the effect of this medicine. Avoid alcoholic drinks. Your mouth may get dry. Chewing sugarless gum or sucking hard candy, and drinking plenty of water may help. Contact your doctor if the problem does not go away or is severe. This medicine may affect blood sugar levels. If you have diabetes, check with your doctor or health care professional before you change your diet or the dose of your diabetic medicine. What side effects may I notice from receiving this medicine? Side effects that you should report to your doctor or health care professional as soon as possible: -allergic reactions like skin rash, itching or hives, swelling of the face, lips, or tongue -breathing problems -confusion -fast or irregular heart rate, palpitations -flu-like fever, chills, cough, muscle or joint aches and pains -seizures -suicidal thoughts or other mood changes -tremors -trouble sleeping -unusual bleeding or bruising -unusually tired or weak -vomiting Side effects that usually do not require medical attention (report to your doctor or health care professional if they continue or are bothersome): -blurred vision -change in sex drive or performance -diarrhea -dry mouth -flushing -headache -increased or decreased appetite -nausea -sweating This list may not describe all possible side effects. Call your doctor for medical advice about side  effects. You may report side effects to FDA at 1-800-FDA-1088. Where should I keep my medicine? Keep out of the reach of children. Store at room temperature between 15 and 30 degrees C (59 and 86 degrees F). Throw away any unused medicine after the expiration date. NOTE: This sheet is a summary. It may not cover all possible information. If you have questions about this medicine, talk to your doctor, pharmacist, or health care provider.  2013, [employer]. (04/06/2012 7:31:49 PM)

## 2013-05-02 NOTE — Telephone Encounter (Signed)
05/02/13 Instructed patient on medication concern he had regarding  Fluoxetine. P.Rosezetta Balderston,RN BSN [facility]

## 2013-05-02 NOTE — Telephone Encounter (Signed)
Pt wondering if he can take both anxiety meds.  Tried asking him which meds it was he said he would rather discuss it with clinician.

## 2013-05-03 ENCOUNTER — Telehealth: Payer: Self-pay | Admitting: *Deleted

## 2013-05-03 LAB — ACUTE HEP PANEL AND HEP B SURFACE AB
Hep B C IgM: NEGATIVE
Hepatitis B Surface Ag: NEGATIVE

## 2013-05-03 NOTE — Telephone Encounter (Signed)
05/03/13 Patient informed that lab results are not back. P.Destine Zirkle,RN

## 2013-05-03 NOTE — Telephone Encounter (Signed)
Pt calling about results for blood work.

## 2013-05-04 LAB — GC/CHLAMYDIA PROBE AMP, URINE: Chlamydia, Swab/Urine, PCR: NEGATIVE

## 2013-05-06 ENCOUNTER — Telehealth: Payer: Self-pay | Admitting: *Deleted

## 2013-05-06 NOTE — Progress Notes (Signed)
05/06/13 Patient made aware of labs all came back essentially fine per Dr. Lucretia Roers .STD work up all negative.  A1C indicates prediabetic and B12 is low end normal. Will discus  With MD on Friday which is patient scheduled appointment. P.Ameisha Mcclellan,RN

## 2013-05-06 NOTE — Telephone Encounter (Signed)
05/06/13 Patient informed of lab results all eseentially fine. STD workup all negative. A1C indicates prediabetic  And B12 is low end normal. Patient will discuss with MD on Friday on schedule appointment. P.Rahcel Shutes,RN

## 2013-05-10 ENCOUNTER — Telehealth: Payer: Self-pay | Admitting: *Deleted

## 2013-05-10 ENCOUNTER — Ambulatory Visit: Payer: Medicaid - Out of State | Attending: Family Medicine | Admitting: Internal Medicine

## 2013-05-10 VITALS — BP 102/76 | HR 87 | Temp 98.3°F | Resp 17 | Wt 128.6 lb

## 2013-05-10 DIAGNOSIS — F419 Anxiety disorder, unspecified: Secondary | ICD-10-CM | POA: Insufficient documentation

## 2013-05-10 DIAGNOSIS — F418 Other specified anxiety disorders: Secondary | ICD-10-CM | POA: Insufficient documentation

## 2013-05-10 DIAGNOSIS — F341 Dysthymic disorder: Secondary | ICD-10-CM | POA: Insufficient documentation

## 2013-05-10 DIAGNOSIS — F329 Major depressive disorder, single episode, unspecified: Secondary | ICD-10-CM | POA: Insufficient documentation

## 2013-05-10 MED ORDER — CLONAZEPAM 1 MG PO TABS: 1.0000 mg | ORAL_TABLET | Freq: Two times a day (BID) | ORAL | Status: DC | PRN

## 2013-05-10 NOTE — Progress Notes (Signed)
Patient ID: Anthony Henderson, male   DOB: [birth year], 32 y.o.   MRN: [id number]  CC: Followup visit for anxiety and depression  HPI: 32 year old male with past medical history of anxiety and depression who presented to clinic for followup. Patient reports symptoms are being controlled with clonazepam only and not with fluoxetine. Patient reports taking Clonopin every 2-3 days for shortness of breath or palpitations. At this time patient is feeling comfortable with no shortness of breath and no chest pain. No abdominal pain, nausea or vomiting.  Allergies  Allergen Reactions  . Penicillins Hives   History reviewed. No pertinent past medical history. Current Outpatient Prescriptions on File Prior to Visit  Medication Sig Dispense Refill  . clindamycin (CLEOCIN) 150 MG capsule Take 150 mg by mouth 3 (three) times daily. Start date 04/09/13; Duration unknown      . clonazePAM (KLONOPIN) 1 MG tablet Take 1 tablet (1 mg total) by mouth 2 (two) times daily as needed for anxiety.  10 tablet  0  . FLUoxetine (PROZAC) 20 MG tablet Take 1 tablet (20 mg total) by mouth daily.  10 tablet  0  . HYDROcodone-acetaminophen (VICODIN) 5-500 MG per tablet Take 1 tablet by mouth every 6 (six) hours as needed for pain.      Marland Kitchen ibuprofen (ADVIL,MOTRIN) 200 MG tablet Take 200 mg by mouth every 6 (six) hours as needed for pain.      Marland Kitchen ondansetron (ZOFRAN) 4 MG tablet Take 4 mg by mouth every 8 (eight) hours as needed for nausea.      Marland Kitchen oxyCODONE-acetaminophen (PERCOCET/ROXICET) 5-325 MG per tablet Take 1-2 tablets by mouth every 4 (four) hours as needed for pain.       No current facility-administered medications on file prior to visit.   No family history on file. History   Social History  . Marital Status: Single    Spouse Name: N/A    Number of Children: N/A  . Years of Education: N/A   Occupational History  . Not on file.   Social History Main Topics  . Smoking status: Current Every Day Smoker  .  Smokeless tobacco: Not on file  . Alcohol Use: No  . Drug Use: No  . Sexually Active: Not on file   Other Topics Concern  . Not on file   Social History Narrative  . No narrative on file   Family medical history significant for HTN, HLD  Review of Systems  Constitutional: Negative for fever, chills, diaphoresis, activity change, appetite change and fatigue.  HENT: Negative for ear pain, nosebleeds, congestion, facial swelling, rhinorrhea, neck pain, neck stiffness and ear discharge.   Eyes: Negative for pain, discharge, redness, itching and visual disturbance.  Respiratory: Negative for cough, choking, chest tightness, shortness of breath, wheezing and stridor.   Cardiovascular: Negative for chest pain, palpitations and leg swelling.  Gastrointestinal: Negative for abdominal distention.  Genitourinary: Negative for dysuria, urgency, frequency, hematuria, flank pain, decreased urine volume, difficulty urinating and dyspareunia.  Musculoskeletal: Negative for back pain, joint swelling, arthralgias and gait problem.  Neurological: Negative for dizziness, tremors, seizures, syncope, facial asymmetry, speech difficulty, weakness, light-headedness, numbness and headaches.  Hematological: Negative for adenopathy. Does not bruise/bleed easily.  Psychiatric/Behavioral: Negative for hallucinations, behavioral problems, confusion, dysphoric mood, decreased concentration and agitation.    Objective:   Filed Vitals:   05/10/13 1329  BP: 102/76  Pulse: 87  Temp: 98.3 F (36.8 C)  Resp: 17    Physical Exam  Constitutional: Appears  well-developed and well-nourished. No distress.  HENT: Normocephalic. External right and left ear normal. Oropharynx is clear and moist.  Eyes: Conjunctivae and EOM are normal. PERRLA, no scleral icterus.  Neck: Normal ROM. Neck supple. No JVD. No tracheal deviation. No thyromegaly.  CVS: RRR, S1/S2 +, no murmurs, no gallops, no carotid bruit.  Pulmonary:  Effort and breath sounds normal, no stridor, rhonchi, wheezes, rales.  Abdominal: Soft. BS +,  no distension, tenderness, rebound or guarding.  Musculoskeletal: Normal range of motion. No edema and no tenderness.  Lymphadenopathy: No lymphadenopathy noted, cervical, inguinal. Neuro: Alert. Normal reflexes, muscle tone coordination. No cranial nerve deficit. Skin: Skin is warm and dry. No rash noted. Not diaphoretic. No erythema. No pallor.  Psychiatric: Normal mood and affect. Behavior, judgment, thought content normal.   Lab Results  Component Value Date   WBC 3.9* 05/02/2013   HGB 15.2 05/02/2013   HCT 43.7 05/02/2013   MCV 85.7 05/02/2013   PLT 284 05/02/2013   Lab Results  Component Value Date   CREATININE 1.06 05/02/2013   BUN 9 05/02/2013   NA 137 05/02/2013   K 4.9 05/02/2013   CL 101 05/02/2013   CO2 29 05/02/2013    Lab Results  Component Value Date   HGBA1C 5.9* 05/02/2013   Lipid Panel  No results found for this basename: chol, trig, hdl, cholhdl, vldl, ldlcalc       Assessment and plan:   Patient Active Problem List   Diagnosis Date Noted  . Anxiety and depression 05/10/2013    Priority: Medium - Patient reports taking Klonopin which relieves the symptoms. We will stop fluoxetine as patient reports no symptom relief with this medication

## 2013-05-10 NOTE — Progress Notes (Signed)
Patient states since taking fluoxetine- he feels tired more often And makes him feel like he has a flutter in his chest area

## 2013-05-10 NOTE — Patient Instructions (Signed)
Preventive Care for Adults, Male  A healthy lifestyle and preventive care can promote health and wellness. Preventive health guidelines for men include the following key practices:  · A routine yearly physical is a good way to check with your caregiver about your health and preventative screening. It is a chance to share any concerns and updates on your health, and to receive a thorough exam.  · Visit your dentist for a routine exam and preventative care every 6 months. Brush your teeth twice a day and floss once a day. Good oral hygiene prevents tooth decay and gum disease.  · The frequency of eye exams is based on your age, health, family medical history, use of contact lenses, and other factors. Follow your caregiver's recommendations for frequency of eye exams.  · Eat a healthy diet. Foods like vegetables, fruits, whole grains, low-fat dairy products, and lean protein foods contain the nutrients you need without too many calories. Decrease your intake of foods high in solid fats, added sugars, and salt. Eat the right amount of calories for you. Get information about a proper diet from your caregiver, if necessary.  · Regular physical exercise is one of the most important things you can do for your health. Most adults should get at least 150 minutes of moderate-intensity exercise (any activity that increases your heart rate and causes you to sweat) each week. In addition, most adults need muscle-strengthening exercises on 2 or more days a week.  · Maintain a healthy weight. The body mass index (BMI) is a screening tool to identify possible weight problems. It provides an estimate of body fat based on height and weight. Your caregiver can help determine your BMI, and can help you achieve or maintain a healthy weight. For adults 20 years and older:  · A BMI below 18.5 is considered underweight.  · A BMI of 18.5 to 24.9 is normal.  · A BMI of 25 to 29.9 is considered overweight.  · A BMI of 30 and above is  considered obese.  · Maintain normal blood lipids and cholesterol levels by exercising and minimizing your intake of saturated fat. Eat a balanced diet with plenty of fruit and vegetables. Blood tests for lipids and cholesterol should begin at age 20 and be repeated every 5 years. If your lipid or cholesterol levels are high, you are over 50, or you are a high risk for heart disease, you may need your cholesterol levels checked more frequently. Ongoing high lipid and cholesterol levels should be treated with medicines if diet and exercise are not effective.  · If you smoke, find out from your caregiver how to quit. If you do not use tobacco, do not start.  · If you choose to drink alcohol, do not exceed 2 drinks per day. One drink is considered to be 12 ounces (355 mL) of beer, 5 ounces (148 mL) of wine, or 1.5 ounces (44 mL) of liquor.  · Avoid use of street drugs. Do not share needles with anyone. Ask for help if you need support or instructions about stopping the use of drugs.  · High blood pressure causes heart disease and increases the risk of stroke. Your blood pressure should be checked at least every 1 to 2 years. Ongoing high blood pressure should be treated with medicines, if weight loss and exercise are not effective.  · If you are 45 to 34 years old, ask your caregiver if you should take aspirin to prevent heart disease.  · Diabetes screening involves taking   a blood sample to check your fasting blood sugar level. This should be done once every 3 years, after age 45, if you are within normal weight and without risk factors for diabetes. Testing should be considered at a younger age or be carried out more frequently if you are overweight and have at least 1 risk factor for diabetes.  · Colorectal cancer can be detected and often prevented. Most routine colorectal cancer screening begins at the age of 50 and continues through age 75. However, your caregiver may recommend screening at an earlier age if you  have risk factors for colon cancer. On a yearly basis, your caregiver may provide home test kits to check for hidden blood in the stool. Use of a small camera at the end of a tube, to directly examine the colon (sigmoidoscopy or colonoscopy), can detect the earliest forms of colorectal cancer. Talk to your caregiver about this at age 50, when routine screening begins.  Direct examination of the colon should be repeated every 5 to 10 years through age 75, unless early forms of pre-cancerous polyps or small growths are found.  · Hepatitis C blood testing is recommended for all people born from 1945 through 1965 and any individual with known risks for hepatitis C.  · Practice safe sex. Use condoms and avoid high-risk sexual practices to reduce the spread of sexually transmitted infections (STIs). STIs include gonorrhea, chlamydia, syphilis, trichomonas, herpes, HPV, and human immunodeficiency virus (HIV). Herpes, HIV, and HPV are viral illnesses that have no cure. They can result in disability, cancer, and death.  · A one-time screening for abdominal aortic aneurysm (AAA) and surgical repair of large AAAs by sound wave imaging (ultrasonography) is recommended for ages 65 to 75 years who are current or former smokers.  · Healthy men should no longer receive prostate-specific antigen (PSA) blood tests as part of routine cancer screening. Consult with your caregiver about prostate cancer screening.  · Testicular cancer screening is not recommended for adult males who have no symptoms. Screening includes self-exam, caregiver exam, and other screening tests. Consult with your caregiver about any symptoms you have or any concerns you have about testicular cancer.  · Use sunscreen with skin protection factor (SPF) of 30 or more. Apply sunscreen liberally and repeatedly throughout the day. You should seek shade when your shadow is shorter than you. Protect yourself by wearing long sleeves, pants, a wide-brimmed hat, and  sunglasses year round, whenever you are outdoors.  · Once a month, do a whole body skin exam, using a mirror to look at the skin on your back. Notify your caregiver of new moles, moles that have irregular borders, moles that are larger than a pencil eraser, or moles that have changed in shape or color.  · Stay current with required immunizations.  · Influenza. You need a dose every fall (or winter). The composition of the flu vaccine changes each year, so being vaccinated once is not enough.  · Pneumococcal polysaccharide. You need 1 to 2 doses if you smoke cigarettes or if you have certain chronic medical conditions. You need 1 dose at age 65 (or older) if you have never been vaccinated.  · Tetanus, diphtheria, pertussis (Tdap, Td). Get 1 dose of Tdap vaccine if you are younger than age 65 years, are over 65 and have contact with an infant, are a healthcare worker, or simply want to be protected from whooping cough. After that, you need a Td booster dose every 10 years. Consult your   caregiver if you have not had at least 3 tetanus and diphtheria-containing shots sometime in your life or have a deep or dirty wound.  · HPV. This vaccine is recommended for males 13 through 34 years of age. This vaccine may be given to men 22 through 34 years of age who have not completed the 3 dose series. It is recommended for men through age 26 who have sex with men or whose immune system is weakened because of HIV infection, other illness, or medications. The vaccine is given in 3 doses over 6 months.  · Measles, mumps, rubella (MMR). You need at least 1 dose of MMR if you were born in 1957 or later. You may also need a 2nd dose.  · Meningococcal. If you are age 19 to 21 years and a first-year college student living in a residence hall, or have one of several medical conditions, you need to get vaccinated against meningococcal disease. You may also need additional booster doses.  · Zoster (shingles). If you are age 60 years or  older, you should get this vaccine.  · Varicella (chickenpox). If you have never had chickenpox or you were vaccinated but received only 1 dose, talk to your caregiver to find out if you need this vaccine.  · Hepatitis A. You need this vaccine if you have a specific risk factor for hepatitis A virus infection, or you simply wish to be protected from this disease. The vaccine is usually given as 2 doses, 6 to 18 months apart.  · Hepatitis B. You need this vaccine if you have a specific risk factor for hepatitis B virus infection or you simply wish to be protected from this disease. The vaccine is given in 3 doses, usually over 6 months.  Preventative Service / Frequency  Ages 19 to 39  · Blood pressure check.** / Every 1 to 2 years.  · Lipid and cholesterol check.** / Every 5 years beginning at age 20.  · Hepatitis C blood test.** / For any individual with known risks for hepatitis C.  · Skin self-exam. / Monthly.  · Influenza immunization.** / Every year.  · Pneumococcal polysaccharide immunization.** / 1 to 2 doses if you smoke cigarettes or if you have certain chronic medical conditions.  · Tetanus, diphtheria, pertussis (Tdap,Td) immunization. / A one-time dose of Tdap vaccine. After that, you need a Td booster dose every 10 years.  · HPV immunization. / 3 doses over 6 months, if 26 and younger.  · Measles, mumps, rubella (MMR) immunization. / You need at least 1 dose of MMR if you were born in 1957 or later. You may also need a 2nd dose.  · Meningococcal immunization. / 1 dose if you are age 19 to 21 years and a first-year college student living in a residence hall, or have one of several medical conditions, you need to get vaccinated against meningococcal disease. You may also need additional booster doses.  · Varicella immunization.** / Consult your caregiver.  · Hepatitis A immunization.** / Consult your caregiver. 2 doses, 6 to 18 months apart.  · Hepatitis B immunization.** / Consult your caregiver. 3 doses  usually over 6 months.  Ages 40 to 64  · Blood pressure check.** / Every 1 to 2 years.  · Lipid and cholesterol check.** / Every 5 years beginning at age 20.  · Fecal occult blood test (FOBT) of stool. / Every year beginning at age 50 and continuing until age 75. You may not have   to do this test if you get colonoscopy every 10 years.  · Flexible sigmoidoscopy** or colonoscopy.** / Every 5 years for a flexible sigmoidoscopy or every 10 years for a colonoscopy beginning at age 50 and continuing until age 75.  · Hepatitis C blood test.** / For all people born from 1945 through 1965 and any individual with known risks for hepatitis C.  · Skin self-exam. / Monthly.  · Influenza immunization.** / Every year.  · Pneumococcal polysaccharide immunization.** / 1 to 2 doses if you smoke cigarettes or if you have certain chronic medical conditions.  · Tetanus, diphtheria, pertussis (Tdap/Td) immunization.** / A one-time dose of Tdap vaccine. After that, you need a Td booster dose every 10 years.  · Measles, mumps, rubella (MMR) immunization.  / You need at least 1 dose of MMR if you were born in 1957 or later. You may also need a 2nd dose.  · Varicella immunization.**/ Consult your caregiver.  · Meningococcal immunization.** / Consult your caregiver.  · Hepatitis A immunization.** / Consult your caregiver. 2 doses, 6 to 18 months apart.  · Hepatitis B immunization.** / Consult your caregiver. 3 doses, usually over 6 months.  Ages 65 and over  · Blood pressure check.** / Every 1 to 2 years.  · Lipid and cholesterol check.**/ Every 5 years beginning at age 20.  · Fecal occult blood test (FOBT) of stool. / Every year beginning at age 50 and continuing until age 75. You may not have to do this test if you get colonoscopy every 10 years.  · Flexible sigmoidoscopy** or colonoscopy.** / Every 5 years for a flexible sigmoidoscopy or every 10 years for a colonoscopy beginning at age 50 and continuing until age 75.  · Hepatitis C blood  test.** / For all people born from 1945 through 1965 and any individual with known risks for hepatitis C.  · Abdominal aortic aneurysm (AAA) screening.** / A one-time screening for ages 65 to 75 years who are current or former smokers.  · Skin self-exam. / Monthly.  · Influenza immunization.** / Every year.  · Pneumococcal polysaccharide immunization.** / 1 dose at age 65 (or older) if you have never been vaccinated.  · Tetanus, diphtheria, pertussis (Tdap, Td) immunization. / A one-time dose of Tdap vaccine if you are over 65 and have contact with an infant, are a healthcare worker, or simply want to be protected from whooping cough. After that, you need a Td booster dose every 10 years.  · Varicella immunization. ** / Consult your caregiver.  · Meningococcal immunization.** / Consult your caregiver.  · Hepatitis A immunization. ** / Consult your caregiver. 2 doses, 6 to 18 months apart.  · Hepatitis B immunization.** / Check with your caregiver. 3 doses, usually over 6 months.  **Family history and personal history of risk and conditions may change your caregiver's recommendations.  Document Released: 01/17/2002 Document Revised: 02/13/2012 Document Reviewed: 04/18/2011  [employer]® Patient Information ©2014 [employer].

## 2013-06-10 ENCOUNTER — Ambulatory Visit: Payer: Medicaid - Out of State

## 2013-07-01 ENCOUNTER — Emergency Department (HOSPITAL_COMMUNITY)
Admission: EM | Admit: 2013-07-01 | Discharge: 2013-07-01 | Disposition: A | Discharge status: Home / Self Care | Payer: Medicaid - Out of State | Attending: Emergency Medicine | Admitting: Emergency Medicine

## 2013-07-01 ENCOUNTER — Encounter (HOSPITAL_COMMUNITY): Payer: Self-pay | Admitting: Emergency Medicine

## 2013-07-01 DIAGNOSIS — IMO0002 Reserved for concepts with insufficient information to code with codable children: Secondary | ICD-10-CM

## 2013-07-01 DIAGNOSIS — Y929 Unspecified place or not applicable: Secondary | ICD-10-CM | POA: Insufficient documentation

## 2013-07-01 DIAGNOSIS — Z880 Allergy status to penicillin: Secondary | ICD-10-CM | POA: Insufficient documentation

## 2013-07-01 DIAGNOSIS — F17200 Nicotine dependence, unspecified, uncomplicated: Secondary | ICD-10-CM | POA: Insufficient documentation

## 2013-07-01 DIAGNOSIS — Z79899 Other long term (current) drug therapy: Secondary | ICD-10-CM | POA: Insufficient documentation

## 2013-07-01 DIAGNOSIS — Z23 Encounter for immunization: Secondary | ICD-10-CM | POA: Insufficient documentation

## 2013-07-01 DIAGNOSIS — Y939 Activity, unspecified: Secondary | ICD-10-CM | POA: Insufficient documentation

## 2013-07-01 DIAGNOSIS — W269XXA Contact with unspecified sharp object(s), initial encounter: Secondary | ICD-10-CM | POA: Insufficient documentation

## 2013-07-01 DIAGNOSIS — S91009A Unspecified open wound, unspecified ankle, initial encounter: Secondary | ICD-10-CM | POA: Insufficient documentation

## 2013-07-01 DIAGNOSIS — S81009A Unspecified open wound, unspecified knee, initial encounter: Secondary | ICD-10-CM | POA: Insufficient documentation

## 2013-07-01 MED ORDER — TETANUS-DIPHTH-ACELL PERTUSSIS 5-2.5-18.5 LF-MCG/0.5 IM SUSP
0.5000 mL | Freq: Once | INTRAMUSCULAR | Status: AC
  Administered 2013-07-01: 0.5 mL via INTRAMUSCULAR
  Filled 2013-07-01: qty 0.5

## 2013-07-01 NOTE — ED Provider Notes (Signed)
CSN: [id number]     Arrival date & time 07/01/13  1711 History    This chart was scribed for Anthony Henderson, non-physician practitioner working with Laray Anger, DO by Leone Payor, ED Scribe. This patient was seen in room WTR6/WTR6 and the patient's care was started at 1711.   First MD Initiated Contact with Patient 07/01/13 1720     Chief Complaint  Patient presents with  . Extremity Laceration    left leg    The history is provided by the patient. No language interpreter was used.    HPI Comments: Anthony Henderson is a 32 y.o. male who presents to the Emergency Department complaining of a laceration to the left leg that occurred last night. Pt states he scraped his leg on an object around 8PM coming out of a crawl space. He is unsure if it was cut on wood, metal, plastic. Pt has applied alcohol, peroxide, and neosporin to the wound. Last tetanus is unknown.    History reviewed. No pertinent past medical history. History reviewed. No pertinent past surgical history. No family history on file. History  Substance Use Topics  . Smoking status: Current Every Day Smoker  . Smokeless tobacco: Not on file  . Alcohol Use: No    Review of Systems  Constitutional: Negative for fever.  Skin: Positive for wound.  All other systems reviewed and are negative.    Allergies  Penicillins  Home Medications   Current Outpatient Rx  Name  Route  Sig  Dispense  Refill  . clindamycin (CLEOCIN) 150 MG capsule   Oral   Take 150 mg by mouth 3 (three) times daily. Start date 04/09/13; Duration unknown         . clonazePAM (KLONOPIN) 1 MG tablet   Oral   Take 1 tablet (1 mg total) by mouth 2 (two) times daily as needed for anxiety.   45 tablet   0   . FLUoxetine (PROZAC) 20 MG tablet   Oral   Take 1 tablet (20 mg total) by mouth daily.   10 tablet   0   . HYDROcodone-acetaminophen (VICODIN) 5-500 MG per tablet   Oral   Take 1 tablet by mouth every 6 (six) hours as needed  for pain.         Marland Kitchen ibuprofen (ADVIL,MOTRIN) 200 MG tablet   Oral   Take 200 mg by mouth every 6 (six) hours as needed for pain.         Marland Kitchen ondansetron (ZOFRAN) 4 MG tablet   Oral   Take 4 mg by mouth every 8 (eight) hours as needed for nausea.         Marland Kitchen oxyCODONE-acetaminophen (PERCOCET/ROXICET) 5-325 MG per tablet   Oral   Take 1-2 tablets by mouth every 4 (four) hours as needed for pain.          BP 118/83  Pulse 95  Temp(Src) 98.5 F (36.9 C) (Oral)  Resp 18  SpO2 100% Physical Exam  Nursing note and vitals reviewed. Constitutional: He is oriented to person, place, and time. He appears well-developed and well-nourished.  HENT:  Head: Normocephalic and atraumatic.  Eyes: Conjunctivae and EOM are normal. Pupils are equal, round, and reactive to light.  Neck: Normal range of motion. Neck supple.  Cardiovascular: Normal rate, regular rhythm and normal heart sounds.   Pulmonary/Chest: Effort normal and breath sounds normal.  Abdominal: Soft. Bowel sounds are normal.  Musculoskeletal: Normal range of motion.  Neurological: He  is alert and oriented to person, place, and time.  Skin: Skin is warm and dry.     1cm laceration to the left lower extremity as diagrammed. Bleeding is controlled. No foreign bodies are seen.   Psychiatric: He has a normal mood and affect.    ED Course   Procedures (including critical care time)  DIAGNOSTIC STUDIES: Oxygen Saturation is 100% on RA, normal by my interpretation.    COORDINATION OF CARE: 5:31 PM Discussed treatment plan with pt at bedside and pt agreed to plan.   Labs Reviewed - No data to display No results found. 1. Laceration     MDM  Patient with small laceration to the shin, this occurred yesterday. We'll not repair based on the length of time since injury. Will update tetanus. No evidence of infection. Patient is stable and ready for discharge.  I personally performed the services described in this  documentation, which was scribed in my presence. The recorded information has been reviewed and is accurate.    Anthony Horseman, PA-C 07/02/13 0017

## 2013-07-01 NOTE — ED Notes (Signed)
Pt states his scrap leg on object last night around 2000. Small laceration to leg, no pain

## 2013-07-03 NOTE — ED Provider Notes (Signed)
Medical screening examination/treatment/procedure(s) were performed by non-physician practitioner and as supervising physician I was immediately available for consultation/collaboration.   Jhonatan Lomeli M Jelan Batterton, DO 07/03/13 1633 

## 2013-10-10 ENCOUNTER — Other Ambulatory Visit: Payer: Self-pay

## 2013-12-23 ENCOUNTER — Emergency Department (HOSPITAL_COMMUNITY)
Admission: EM | Admit: 2013-12-23 | Discharge: 2013-12-23 | Disposition: A | Discharge status: Home / Self Care | Payer: Medicaid - Out of State | Attending: Emergency Medicine | Admitting: Emergency Medicine

## 2013-12-23 ENCOUNTER — Encounter (HOSPITAL_COMMUNITY): Payer: Self-pay | Admitting: Emergency Medicine

## 2013-12-23 ENCOUNTER — Emergency Department (HOSPITAL_COMMUNITY): Payer: Medicaid - Out of State

## 2013-12-23 DIAGNOSIS — Y929 Unspecified place or not applicable: Secondary | ICD-10-CM | POA: Insufficient documentation

## 2013-12-23 DIAGNOSIS — Z880 Allergy status to penicillin: Secondary | ICD-10-CM | POA: Insufficient documentation

## 2013-12-23 DIAGNOSIS — Z79899 Other long term (current) drug therapy: Secondary | ICD-10-CM | POA: Insufficient documentation

## 2013-12-23 DIAGNOSIS — S29012A Strain of muscle and tendon of back wall of thorax, initial encounter: Secondary | ICD-10-CM

## 2013-12-23 DIAGNOSIS — M79609 Pain in unspecified limb: Secondary | ICD-10-CM | POA: Insufficient documentation

## 2013-12-23 DIAGNOSIS — S239XXA Sprain of unspecified parts of thorax, initial encounter: Secondary | ICD-10-CM | POA: Insufficient documentation

## 2013-12-23 DIAGNOSIS — M62838 Other muscle spasm: Secondary | ICD-10-CM | POA: Insufficient documentation

## 2013-12-23 DIAGNOSIS — Y990 Civilian activity done for income or pay: Secondary | ICD-10-CM | POA: Insufficient documentation

## 2013-12-23 DIAGNOSIS — X500XXA Overexertion from strenuous movement or load, initial encounter: Secondary | ICD-10-CM | POA: Insufficient documentation

## 2013-12-23 DIAGNOSIS — F17200 Nicotine dependence, unspecified, uncomplicated: Secondary | ICD-10-CM | POA: Insufficient documentation

## 2013-12-23 LAB — COMPREHENSIVE METABOLIC PANEL
ALT: 13 U/L (ref 0–53)
AST: 23 U/L (ref 0–37)
Albumin: 4 g/dL (ref 3.5–5.2)
Alkaline Phosphatase: 47 U/L (ref 39–117)
BILIRUBIN TOTAL: 0.4 mg/dL (ref 0.3–1.2)
BUN: 12 mg/dL (ref 6–23)
CHLORIDE: 100 meq/L (ref 96–112)
CO2: 27 meq/L (ref 19–32)
Calcium: 9.3 mg/dL (ref 8.4–10.5)
Creatinine, Ser: 0.9 mg/dL (ref 0.50–1.35)
GFR calc Af Amer: >90 mL/min (ref >90)
GLUCOSE: 99 mg/dL (ref 70–99)
Potassium: 4.6 mEq/L (ref 3.7–5.3)
SODIUM: 138 meq/L (ref 137–147)
Total Protein: 7.3 g/dL (ref 6.0–8.3)

## 2013-12-23 LAB — CBC
HCT: 42.8 % (ref 39.0–52.0)
HEMOGLOBIN: 14.7 g/dL (ref 13.0–17.0)
MCH: 31.1 pg (ref 26.0–34.0)
MCHC: 34.3 g/dL (ref 30.0–36.0)
MCV: 90.7 fL (ref 78.0–100.0)
Platelets: 214 10*3/uL (ref 150–400)
RBC: 4.72 MIL/uL (ref 4.22–5.81)
RDW: 12 % (ref 11.5–15.5)
WBC: 2.6 10*3/uL — ABNORMAL LOW (ref 4.0–10.5)

## 2013-12-23 LAB — POCT I-STAT TROPONIN I: Troponin i, poc: 0 ng/mL (ref 0.00–0.08)

## 2013-12-23 MED ORDER — NAPROXEN 375 MG PO TABS: 375.0000 mg | ORAL_TABLET | Freq: Two times a day (BID) | ORAL | Status: DC

## 2013-12-23 MED ORDER — CYCLOBENZAPRINE HCL 10 MG PO TABS: 10.0000 mg | ORAL_TABLET | Freq: Two times a day (BID) | ORAL | Status: DC | PRN

## 2013-12-23 MED ORDER — DIAZEPAM 5 MG PO TABS
5.0000 mg | ORAL_TABLET | Freq: Once | ORAL | Status: AC
  Administered 2013-12-23: 5 mg via ORAL
  Filled 2013-12-23: qty 1

## 2013-12-23 NOTE — Discharge Instructions (Signed)
RICE: Routine Care for Injuries The routine care of many injuries includes Rest, Ice, Compression, and Elevation (RICE). HOME CARE INSTRUCTIONS  Rest is needed to allow your body to heal. Routine activities can usually be resumed when comfortable. Injured tendons and bones can take up to 6 weeks to heal. Tendons are the cord-like structures that attach muscle to bone.  Ice following an injury helps keep the swelling down and reduces pain.  Put ice in a plastic bag.  Place a towel between your skin and the bag.  Leave the ice on for 15-20 minutes, 03-04 times a day. Do this while awake, for the first 24 to 48 hours. After that, continue as directed by your caregiver.  Compression helps keep swelling down. It also gives support and helps with discomfort. If an elastic bandage has been applied, it should be removed and reapplied every 3 to 4 hours. It should not be applied tightly, but firmly enough to keep swelling down. Watch fingers or toes for swelling, bluish discoloration, coldness, numbness, or excessive pain. If any of these problems occur, remove the bandage and reapply loosely. Contact your caregiver if these problems continue.  Elevation helps reduce swelling and decreases pain. With extremities, such as the arms, hands, legs, and feet, the injured area should be placed near or above the level of the heart, if possible. SEEK IMMEDIATE MEDICAL CARE IF:  You have persistent pain and swelling.  You develop redness, numbness, or unexpected weakness.  Your symptoms are getting worse rather than improving after several days. These symptoms may indicate that further evaluation or further X-rays are needed. Sometimes, X-rays may not show a small broken bone (fracture) until 1 week or 10 days later. Make a follow-up appointment with your caregiver. Ask when your X-ray results will be ready. Make sure you get your X-ray results. Document Released: 03/05/2001 Document Revised: 02/13/2012  Document Reviewed: 04/22/2011 [facility] Patient Information 2014 [place], [place]. Muscle Strain A muscle strain is an injury that occurs when a muscle is stretched beyond its normal length. Usually a small number of muscle fibers are torn when this happens. Muscle strain is rated in degrees. First-degree strains have the least amount of muscle fiber tearing and pain. Second-degree and third-degree strains have increasingly more tearing and pain.  Usually, recovery from muscle strain takes 1 2 weeks. Complete healing takes 5 6 weeks.  CAUSES  Muscle strain happens when a sudden, violent force placed on a muscle stretches it too far. This may occur with lifting, sports, or a fall.  RISK FACTORS Muscle strain is especially common in athletes.  SIGNS AND SYMPTOMS At the site of the muscle strain, there may be:  Pain.  Bruising.  Swelling.  Difficulty using the muscle due to pain or lack of normal function. DIAGNOSIS  Your health care provider will perform a physical exam and ask about your medical history. TREATMENT  Often, the best treatment for a muscle strain is resting, icing, and applying cold compresses to the injured area.  HOME CARE INSTRUCTIONS   Use the PRICE method of treatment to promote muscle healing during the first 2 3 days after your injury. The PRICE method involves:  Protecting the muscle from being injured again.  Restricting your activity and resting the injured body part.  Icing your injury. To do this, put ice in a plastic bag. Place a towel between your skin and the bag. Then, apply the ice and leave it on from 15 20 minutes each hour. After  the third day, switch to moist heat packs.  Apply compression to the injured area with a splint or elastic bandage. Be careful not to wrap it too tightly. This may interfere with blood circulation or increase swelling.  Elevate the injured body part above the level of your heart as often as you can.  Only take  over-the-counter or prescription medicines for pain, discomfort, or fever as directed by your health care provider.  Warming up prior to exercise helps to prevent future muscle strains. SEEK MEDICAL CARE IF:   You have increasing pain or swelling in the injured area.  You have numbness, tingling, or a significant loss of strength in the injured area. MAKE SURE YOU:   Understand these instructions.  Will watch your condition.  Will get help right away if you are not doing well or get worse. Document Released: 11/21/2005 Document Revised: 09/11/2013 Document Reviewed: 06/20/2013 [facility] Patient Information 2014 [place], [place].

## 2013-12-23 NOTE — ED Notes (Signed)
Front left chest pain hurts to take a deep breath and his back hurts  X 1 week  No n/v/sob

## 2013-12-23 NOTE — ED Provider Notes (Signed)
Medical screening examination/treatment/procedure(s) were performed by non-physician practitioner and as supervising physician I was immediately available for consultation/collaboration.  EKG Interpretation   None         Blanchie Dessert, MD 12/23/13 1645

## 2013-12-23 NOTE — ED Notes (Signed)
Pt reports substernal and left chest pain with inspiration and ROM that comes usually when patient is standing. Goes away on its own for about the last week. Pt reports nothing has relieved the pain including muscle relaxer his wife gave him. Denies recent injury.

## 2013-12-23 NOTE — ED Provider Notes (Signed)
CSN: [id number]     Arrival date & time 12/23/13  1258 History   First MD Initiated Contact with Patient 12/23/13 1416     Chief Complaint  Patient presents with  . Chest Pain   (Consider location/radiation/quality/duration/timing/severity/associated sxs/prior Treatment) HPI  Patient presents to the emergency department with complaints of left-sided back pain and chest pain that worsens with movement and a large breath for one week. He says that he works for [employer] and was carrying a very large ladder when he noticed that night he was having some discomfort. His wife is a Marine scientist and concerned that the pain may be his heart and encouraged him to come to the ER to get evaluated. The symptoms are intermittent and change with position and movement. He has not had any association of nausea, vomiting, diarrhea, diaphoresis, weakness, confusion. He is otherwise healthy.   History reviewed. No pertinent past medical history. History reviewed. No pertinent past surgical history. No family history on file. History  Substance Use Topics  . Smoking status: Current Every Day Smoker  . Smokeless tobacco: Not on file  . Alcohol Use: Yes    Review of Systems The patient denies anorexia, fever, weight loss,, vision loss, decreased hearing, hoarseness,  syncope, dyspnea on exertion, peripheral edema, balance deficits, hemoptysis, abdominal pain, melena, hematochezia, severe indigestion/heartburn, hematuria, incontinence, genital sores, muscle weakness, suspicious skin lesions, transient blindness, difficulty walking, depression, unusual weight change, abnormal bleeding, enlarged lymph nodes, angioedema, and breast masses.  Allergies  Penicillins  Home Medications   Current Outpatient Rx  Name  Route  Sig  Dispense  Refill  . cyclobenzaprine (FLEXERIL) 10 MG tablet   Oral   Take 10 mg by mouth once.         . clonazePAM (KLONOPIN) 1 MG tablet   Oral   Take 1 mg by mouth 2 (two) times  daily as needed for anxiety.         . cyclobenzaprine (FLEXERIL) 10 MG tablet   Oral   Take 1 tablet (10 mg total) by mouth 2 (two) times daily as needed for muscle spasms.   20 tablet   0   . naproxen (NAPROSYN) 375 MG tablet   Oral   Take 1 tablet (375 mg total) by mouth 2 (two) times daily.   20 tablet   0    BP 113/79  Pulse 65  Temp(Src) 99.1 F (37.3 C)  Resp 13  SpO2 100% Physical Exam  Nursing note and vitals reviewed. Constitutional: He appears well-developed and well-nourished. No distress.  HENT:  Head: Normocephalic and atraumatic.  Eyes: Pupils are equal, round, and reactive to light.  Neck: Normal range of motion. Neck supple.  Cardiovascular: Normal rate and regular rhythm.   Pulmonary/Chest: Effort normal.  Abdominal: Soft.  Musculoskeletal:       Left shoulder: He exhibits decreased range of motion (due to pain), tenderness, pain and spasm. He exhibits no bony tenderness, no swelling, no deformity, no laceration, normal pulse and normal strength.  Neurological: He is alert.  Skin: Skin is warm and dry.    ED Course  Procedures (including critical care time) Labs Review Labs Reviewed  CBC - Abnormal; Notable for the following:    WBC 2.6 (*)    All other components within normal limits  COMPREHENSIVE METABOLIC PANEL  POCT I-STAT TROPONIN I   Imaging Review Dg Chest 2 View  12/23/2013   CLINICAL DATA:  Chest pain radiating to left arm.  EXAM: CHEST  2 VIEW  COMPARISON:  01/09/2012  FINDINGS: The heart size and mediastinal contours are within normal limits. Both lungs are clear. The visualized skeletal structures are unremarkable.  IMPRESSION: No active cardiopulmonary disease.   Electronically Signed   By: Earle Gell M.D.   On: 12/23/2013 13:36    EKG Interpretation   None       MDM   1. Muscle strain of left upper back     Date: 12/23/2013  Rate: 86  Rhythm: normal sinus rhythm  QRS Axis: normal  Intervals: normal  ST/T Wave  abnormalities: normal  Conduction Disutrbances:none  Narrative Interpretation:   Old EKG Reviewed: unchanged   Laboratory work up, including EKG., troponin,  and chest xray are very reassuring. He was given a 5 mg tab of valium and his symptoms resolved significantly. Symptoms are not consistent with cardiac symptoms, I feel these symptoms are more muscular.  Will Rx Flexeril and Napryson - RICE protocol recommended.  33 y.o.Doristine Church Yepes's evaluation in the Emergency Department is complete. It has been determined that no acute conditions requiring further emergency intervention are present at this time. The patient/guardian have been advised of the diagnosis and plan. We have discussed signs and symptoms that warrant return to the ED, such as changes or worsening in symptoms.  Vital signs are stable at discharge. Filed Vitals:   12/23/13 1535  BP: 113/79  Pulse: 65  Temp:   Resp: 13    Patient/guardian has voiced understanding and agreed to follow-up with the PCP or specialist.    Linus Mako, PA-C 12/23/13 1625

## 2014-02-03 ENCOUNTER — Encounter (HOSPITAL_COMMUNITY): Payer: Self-pay | Admitting: Emergency Medicine

## 2014-02-03 ENCOUNTER — Emergency Department (HOSPITAL_COMMUNITY): Payer: Medicaid - Out of State

## 2014-02-03 ENCOUNTER — Emergency Department (HOSPITAL_COMMUNITY)
Admission: EM | Admit: 2014-02-03 | Discharge: 2014-02-03 | Disposition: A | Discharge status: Home / Self Care | Payer: Self-pay | Attending: Emergency Medicine | Admitting: Emergency Medicine

## 2014-02-03 ENCOUNTER — Ambulatory Visit: Payer: Self-pay | Attending: Internal Medicine | Admitting: Internal Medicine

## 2014-02-03 ENCOUNTER — Encounter: Payer: Self-pay | Admitting: Internal Medicine

## 2014-02-03 VITALS — BP 125/82 | HR 163 | Temp 98.6°F | Resp 16 | Ht 72.0 in | Wt 136.0 lb

## 2014-02-03 DIAGNOSIS — M25519 Pain in unspecified shoulder: Secondary | ICD-10-CM | POA: Insufficient documentation

## 2014-02-03 DIAGNOSIS — I471 Supraventricular tachycardia, unspecified: Secondary | ICD-10-CM | POA: Insufficient documentation

## 2014-02-03 DIAGNOSIS — Z880 Allergy status to penicillin: Secondary | ICD-10-CM | POA: Insufficient documentation

## 2014-02-03 DIAGNOSIS — F419 Anxiety disorder, unspecified: Secondary | ICD-10-CM

## 2014-02-03 DIAGNOSIS — F329 Major depressive disorder, single episode, unspecified: Secondary | ICD-10-CM

## 2014-02-03 DIAGNOSIS — F17200 Nicotine dependence, unspecified, uncomplicated: Secondary | ICD-10-CM | POA: Insufficient documentation

## 2014-02-03 DIAGNOSIS — I498 Other specified cardiac arrhythmias: Secondary | ICD-10-CM

## 2014-02-03 DIAGNOSIS — Z79899 Other long term (current) drug therapy: Secondary | ICD-10-CM | POA: Insufficient documentation

## 2014-02-03 DIAGNOSIS — Z09 Encounter for follow-up examination after completed treatment for conditions other than malignant neoplasm: Secondary | ICD-10-CM | POA: Insufficient documentation

## 2014-02-03 DIAGNOSIS — M549 Dorsalgia, unspecified: Secondary | ICD-10-CM | POA: Insufficient documentation

## 2014-02-03 DIAGNOSIS — F341 Dysthymic disorder: Secondary | ICD-10-CM

## 2014-02-03 LAB — BASIC METABOLIC PANEL
BUN: 14 mg/dL (ref 6–23)
CO2: 26 mEq/L (ref 19–32)
Calcium: 9.4 mg/dL (ref 8.4–10.5)
Chloride: 103 mEq/L (ref 96–112)
Creatinine, Ser: 0.9 mg/dL (ref 0.50–1.35)
GFR calc Af Amer: >90 mL/min (ref >90)
GFR calc non Af Amer: >90 mL/min (ref >90)
Glucose, Bld: 100 mg/dL — ABNORMAL HIGH (ref 70–99)
Potassium: 4.1 mEq/L (ref 3.7–5.3)
Sodium: 140 mEq/L (ref 137–147)

## 2014-02-03 LAB — CBC
HCT: 40 % (ref 39.0–52.0)
Hemoglobin: 14 g/dL (ref 13.0–17.0)
MCH: 31.2 pg (ref 26.0–34.0)
MCHC: 35 g/dL (ref 30.0–36.0)
MCV: 89.1 fL (ref 78.0–100.0)
Platelets: 235 10*3/uL (ref 150–400)
RBC: 4.49 MIL/uL (ref 4.22–5.81)
RDW: 12 % (ref 11.5–15.5)
WBC: 3.2 10*3/uL — ABNORMAL LOW (ref 4.0–10.5)

## 2014-02-03 MED ORDER — SODIUM CHLORIDE 0.9 % IV BOLUS (SEPSIS)
1000.0000 mL | Freq: Once | INTRAVENOUS | Status: AC
  Administered 2014-02-03: 1000 mL via INTRAVENOUS

## 2014-02-03 NOTE — Discharge Instructions (Signed)
The cardiologist will call you with an appointment.  Will call you tomorrow at that time.  For your appointment, return here as needed.  Your testing here today, was normal

## 2014-02-03 NOTE — Progress Notes (Signed)
Patient ID: Anthony Henderson, male   DOB: January 16, 1981, 33 y.o.   MRN: [id number]   Euclide Granito, is a 33 y.o. male  GXQ:[id number]  XKG:[id number]  DOB - 10-28-1981  Chief Complaint  Patient presents with  . Hospitalization Follow-up        Subjective:   Anthony Henderson is a 33 y.o. male here today for a follow up visit on his shoulder and back pain. However, his heart rate was noted to be rapid with his vital sign check. He states he has been experiencing episodes of rapid heart rate with some shortness of breath and no chest pain. This has been occuring over the past few months intermittently with no aggravating or relieving factors. At present he is not short of breath or experiencing chest pain. He was diagnosed at another clinic with anxiety and was given clonazepam. He denies any alcohol or drug abuse. He states he has not taken any medications today. His review of systems is otherwise negative. Patient has No headache, No chest pain, No abdominal pain - No Nausea, No new weakness tingling or numbness, No Cough - SOB.  No problems updated.  ALLERGIES: Allergies  Allergen Reactions  . Penicillins Hives    PAST MEDICAL HISTORY: History reviewed. No pertinent past medical history.  MEDICATIONS AT HOME: Prior to Admission medications   Medication Sig Start Date End Date Taking? Authorizing Provider  clonazePAM (KLONOPIN) 1 MG tablet Take 1 mg by mouth 2 (two) times daily as needed for anxiety. 05/10/13  Yes Robbie Lis, MD  cyclobenzaprine (FLEXERIL) 10 MG tablet Take 10 mg by mouth 2 (two) times daily as needed for muscle spasms.     Historical Provider, MD     Objective:  The patients heart rate was approximately 140 upon auscultation with a regular rhythm, no S3 or S4 noted. His blood pressure was 125/82. An ECG was obtained and SVT was noted.The patient is not currently diaphoretic and rates his pain as a zero on a scale of 0-10.  Filed Vitals:   02/03/14 1125  BP:  125/82  Pulse: 163  Temp: 98.6 F (37 C)  TempSrc: Oral  Resp: 16  Height: 6' (1.829 m)  Weight: 136 lb (61.689 kg)  SpO2: 100%    Exam General appearance : Awake, alert, not in any distress. Speech Clear. Not toxic looking HEENT: Atraumatic and Normocephalic, pupils equally reactive to light and accomodation Neck: supple, no JVD. No cervical lymphadenopathy.  Chest:Good air entry bilaterally, no added sounds  CVS: S1 S2 regular, no murmurs. Positive for Tachycardia.  Abdomen: Bowel sounds present, Non tender and not distended with no gaurding, rigidity or rebound. Extremities: B/L Lower Ext shows no edema, both legs are warm to touch Neurology: Awake alert, and oriented X 3, CN II-XII intact, Non focal Skin:No Rash Wounds:N/A  Data Review  Assessment & Plan   SVT   EMS was called and the patient was transported to the hospital for further evaluation.  Follow-up When necessary  The patient was given clear instructions to go to ER or return to medical center if symptoms don't improve, worsen or new problems develop. The patient verbalized understanding. The patient was told to call to get lab results if they haven't heard anything in the next week.    Angelica Chessman, MD, [place], [place], [place] [facility] [place], [place]   02/03/2014, 3:06 PM

## 2014-02-03 NOTE — Progress Notes (Signed)
[facility]U Following up on his shoulder and back pain. Pt came in with a bp of 125/82 and a pr of 163. EKG shows Supraventricular tachycardia. Symptoms of dizziness, shoulder and back pain Pt was taken to the ED by the EMT.

## 2014-02-03 NOTE — ED Notes (Addendum)
Pt origionally went to urgent care for his back. Pt was carrying a ladder for work and suffered a back strain/musles injury. Pt was at urgent care when his heart started beating quickly. Pt states that hsi has happened to him before. Pt states that he is also herein the E.D. for his back. Pt wants a note for work and also a referral for PT or chiropractor.

## 2014-02-03 NOTE — ED Provider Notes (Signed)
CSN: [id number]     Arrival date & time 02/03/14  1214 History   First MD Initiated Contact with Patient 02/03/14 1235     Chief Complaint  Patient presents with  . Irregular Heart Beat     (Consider location/radiation/quality/duration/timing/severity/associated sxs/prior Treatment) HPI Patient presents to the emergency department with tachycardia, that occurred just prior to arrival.  The patient, states, that he was being seen at the [facility], that his heart started racing.  The patient, states he's had similar episodes in the past.  Patient denies nausea, vomiting, weakness, numbness, dizziness, fever cough abdominal pain, shortness of breath, or chest pain.  Patient, states, that nothing seems to make his condition, better or worse History reviewed. No pertinent past medical history. History reviewed. No pertinent past surgical history. History reviewed. No pertinent family history. History  Substance Use Topics  . Smoking status: Current Every Day Smoker  . Smokeless tobacco: Not on file  . Alcohol Use: Yes    Review of Systems   All other systems negative except as documented in the HPI. All pertinent positives and negatives as reviewed in the HPI. Allergies  Penicillins  Home Medications   Current Outpatient Rx  Name  Route  Sig  Dispense  Refill  . clonazePAM (KLONOPIN) 1 MG tablet   Oral   Take 1 mg by mouth 2 (two) times daily as needed for anxiety.         . cyclobenzaprine (FLEXERIL) 10 MG tablet   Oral   Take 10 mg by mouth 2 (two) times daily as needed for muscle spasms.           BP 111/77  Pulse 74  Temp(Src) 98.7 F (37.1 C) (Oral)  Resp 19  Ht 6' (1.829 m)  Wt 136 lb (61.689 kg)  BMI 18.44 kg/m2  SpO2 100% Physical Exam  Nursing note and vitals reviewed. Constitutional: He is oriented to person, place, and time. He appears well-developed and well-nourished. No distress.  HENT:  Head: Normocephalic and atraumatic.  Mouth/Throat:  Oropharynx is clear and moist.  Eyes: Pupils are equal, round, and reactive to light.  Neck: Normal range of motion. Neck supple.  Cardiovascular: Normal rate, regular rhythm and normal heart sounds.  Exam reveals no gallop and no friction rub.   No murmur heard. Pulmonary/Chest: Effort normal and breath sounds normal. No respiratory distress.  Abdominal: Soft. Bowel sounds are normal. He exhibits no distension. There is no tenderness.  Neurological: He is alert and oriented to person, place, and time.  Skin: Skin is warm and dry.    ED Course  Procedures (including critical care time) Labs Review Labs Reviewed  CBC - Abnormal; Notable for the following:    WBC 3.2 (*)    All other components within normal limits  BASIC METABOLIC PANEL - Abnormal; Notable for the following:    Glucose, Bld 100 (*)    All other components within normal limits   Imaging Review Dg Chest 2 View  02/03/2014   CLINICAL DATA:  Tachycardia  EXAM: CHEST  2 VIEW  COMPARISON:  December 23, 2013  FINDINGS: There is no edema or consolidation. Heart size and pulmonary vascularity are normal. No adenopathy. No pneumothorax. There is upper thoracic levoscoliosis.  IMPRESSION: No edema or consolidation.   Electronically Signed   By: Lowella Grip M.D.   On: 02/03/2014 14:45     EKG Interpretation   Date/Time:  Monday February 03 2014 12:21:01 EST Ventricular Rate:  90  PR Interval:  136 QRS Duration: 86 QT Interval:  350 QTC Calculation: 428 R Axis:   84 Text Interpretation:  Sinus rhythm Confirmed by BEATON  MD, ROBERT ([id number])  on 02/03/2014 12:39:44 PM      Patient will need follow up cardiology due to paroxysmal SVT.  Patient is advised to return here as needed.  His heart rate has been normal.  Here in the emergency department [facility] shows [facility]T.  He has a normal EKG in the emergency department I spoke with cardiology and they will call him with an appointment.  Brent General,  PA-C 02/04/14 1554

## 2014-02-03 NOTE — ED Notes (Signed)
Per EMS: Pt went to urgent care for palpations pt states that he was feeling his heart beat fast, not the first time, pt was found to be in SVT (rate of 150-160 at urgent care) pt states in the past he has tried carotid massage to decrease his heart rate but this time pt's heart rate decreased on it's own. Pt was given clonazepam 1mg  at urgent care (per home dose of twice a day as needed for anxiety) and he is feeling rested. Pt HR with EMS was 90's, pt BP 124/80. Urgent care also attempted 3 times to insert IV.

## 2014-02-06 ENCOUNTER — Ambulatory Visit: Payer: Self-pay | Attending: Internal Medicine | Admitting: Internal Medicine

## 2014-02-06 ENCOUNTER — Encounter: Payer: Self-pay | Admitting: Internal Medicine

## 2014-02-06 VITALS — BP 124/83 | HR 73 | Temp 98.1°F | Resp 16 | Wt 140.0 lb

## 2014-02-06 DIAGNOSIS — I471 Supraventricular tachycardia, unspecified: Secondary | ICD-10-CM

## 2014-02-06 DIAGNOSIS — Z09 Encounter for follow-up examination after completed treatment for conditions other than malignant neoplasm: Secondary | ICD-10-CM | POA: Insufficient documentation

## 2014-02-06 DIAGNOSIS — M5380 Other specified dorsopathies, site unspecified: Secondary | ICD-10-CM

## 2014-02-06 DIAGNOSIS — F17200 Nicotine dependence, unspecified, uncomplicated: Secondary | ICD-10-CM | POA: Insufficient documentation

## 2014-02-06 DIAGNOSIS — M62830 Muscle spasm of back: Secondary | ICD-10-CM

## 2014-02-06 DIAGNOSIS — Z79899 Other long term (current) drug therapy: Secondary | ICD-10-CM | POA: Insufficient documentation

## 2014-02-06 MED ORDER — CYCLOBENZAPRINE HCL 10 MG PO TABS: 10.0000 mg | ORAL_TABLET | Freq: Two times a day (BID) | ORAL | Status: DC | PRN

## 2014-02-06 NOTE — Progress Notes (Signed)
MRN: [id number] Name: Anthony Henderson  Sex: male Age: 33 y.o. DOB: [birth year]  Allergies: Penicillins  Chief Complaint  Patient presents with  . Follow-up    HPI: Patient is 33 y.o. male who comes today for followup, recently was sent to the ER because of tachycardia, EMR reviewed patient remained in sinus rhythm, and was diagnosed with paroxysmal SVT, patient reported to have symptoms of palpitation for almost 2 years which last for a few minutes, he was told that he will see a cardiologist, has not made any appointment yet, besides that he reported to have pulled muscle in the back was taking Flexeril, he then out of that medication. Patient denies any chest pain or shortness of breath, he does smoke cigarettes everyday, he will try to quit smoking.  History reviewed. No pertinent past medical history.  History reviewed. No pertinent past surgical history.    Medication List       This list is accurate as of: 02/06/14 12:19 PM.  Always use your most recent med list.               clonazePAM 1 MG tablet  Commonly known as:  KLONOPIN  Take 1 mg by mouth 2 (two) times daily as needed for anxiety.     cyclobenzaprine 10 MG tablet  Commonly known as:  FLEXERIL  Take 1 tablet (10 mg total) by mouth 2 (two) times daily as needed for muscle spasms.        Meds ordered this encounter  Medications  . cyclobenzaprine (FLEXERIL) 10 MG tablet    Sig: Take 1 tablet (10 mg total) by mouth 2 (two) times daily as needed for muscle spasms.    Dispense:  30 tablet    Refill:  1    Immunization History  Administered Date(s) Administered  . Tdap 07/01/2013    History reviewed. No pertinent family history.  History  Substance Use Topics  . Smoking status: Current Every Day Smoker  . Smokeless tobacco: Not on file  . Alcohol Use: Yes    Review of Systems   As noted in HPI  Filed Vitals:   02/06/14 1150  BP: 124/83  Pulse: 73  Temp: 98.1 F (36.7 C)  Resp: 16     Physical Exam  Physical Exam  Constitutional: No distress.  Eyes: EOM are normal. Pupils are equal, round, and reactive to light.  Cardiovascular: Normal rate and regular rhythm.   Pulmonary/Chest: Breath sounds normal. No respiratory distress. He has no wheezes. He has no rales.  Musculoskeletal:  Upper back paraspinal tenderness     CBC    Component Value Date/Time   WBC 3.2* 02/03/2014 1352   RBC 4.49 02/03/2014 1352   HGB 14.0 02/03/2014 1352   HCT 40.0 02/03/2014 1352   PLT 235 02/03/2014 1352   MCV 89.1 02/03/2014 1352   LYMPHSABS 1.9 05/02/2013 1219   MONOABS 0.4 05/02/2013 1219   EOSABS 0.0 05/02/2013 1219   BASOSABS 0.0 05/02/2013 1219    CMP     Component Value Date/Time   NA 140 02/03/2014 1352   K 4.1 02/03/2014 1352   CL 103 02/03/2014 1352   CO2 26 02/03/2014 1352   GLUCOSE 100* 02/03/2014 1352   BUN 14 02/03/2014 1352   CREATININE 0.90 02/03/2014 1352   CREATININE 1.06 05/02/2013 1219   CALCIUM 9.4 02/03/2014 1352   PROT 7.3 12/23/2013 1304   ALBUMIN 4.0 12/23/2013 1304   AST 23 12/23/2013 1304  ALT 13 12/23/2013 1304   ALKPHOS 47 12/23/2013 1304   BILITOT 0.4 12/23/2013 1304   GFRNONAA >90 02/03/2014 1352   GFRAA >90 02/03/2014 1352    No results found for this basename: chol, tri, ldl    No components found with this basename: hga1c    Lab Results  Component Value Date/Time   AST 23 12/23/2013  1:04 PM    Assessment and Plan  PSVT (paroxysmal supraventricular tachycardia) - Plan: Ambulatory referral to Cardiology for further evaluation and management. Today his heart rate is 73.  Back muscle spasm - Plan: cyclobenzaprine (FLEXERIL) 10 MG tablet    Return in about 3 months (around 05/09/2014), or if symptoms worsen or fail to improve.  Lorayne Marek, MD

## 2014-02-06 NOTE — Progress Notes (Signed)
Patient is here for follow up from ED Was seen here this past Monday and  Was taken  To the ER for tachycardia

## 2014-02-07 DIAGNOSIS — I471 Supraventricular tachycardia, unspecified: Secondary | ICD-10-CM | POA: Insufficient documentation

## 2014-02-07 NOTE — ED Provider Notes (Signed)
Medical screening examination/treatment/procedure(s) were performed by non-physician practitioner and as supervising physician I was immediately available for consultation/collaboration.   Rosia Syme L Meagan Spease, MD 02/07/14 1453 

## 2014-02-12 ENCOUNTER — Encounter: Payer: Self-pay | Admitting: Cardiology

## 2014-02-12 ENCOUNTER — Ambulatory Visit: Payer: Medicaid - Out of State | Attending: Cardiology | Admitting: Cardiology

## 2014-02-12 ENCOUNTER — Telehealth: Payer: Self-pay | Admitting: Internal Medicine

## 2014-02-12 VITALS — BP 135/87 | HR 73 | Temp 98.9°F | Resp 16 | Ht 72.0 in | Wt 136.0 lb

## 2014-02-12 DIAGNOSIS — I498 Other specified cardiac arrhythmias: Secondary | ICD-10-CM | POA: Insufficient documentation

## 2014-02-12 DIAGNOSIS — I471 Supraventricular tachycardia: Secondary | ICD-10-CM

## 2014-02-12 NOTE — Progress Notes (Signed)
Pt here to f/u with Dr. Verl Blalock asymptomatic SVT's Pt was transferred to ER 3/2 h/r 160's converted while in room  Denies CP or SOB

## 2014-02-12 NOTE — Patient Instructions (Signed)
Pt instructed to keep appt for scheduled Echo 02/21/14 and he will be notified by EP for Ablasion

## 2014-02-12 NOTE — Telephone Encounter (Signed)
New message  Dr Verl Blalock said you could just call patient and get him scheduled . Please call him on his cell phone [phone]

## 2014-02-12 NOTE — Assessment & Plan Note (Signed)
To my review, this is clearly SVT. Historically this is happening about once to twice a week. I will get a TSH, 2-D echocardiogram to rule out any structural heart disease and referred him to electrophysiology at [facility]. They will try to see him by early next week.

## 2014-02-12 NOTE — Progress Notes (Signed)
HPI [name] is a 33 year old black male who comes in today because of SVT. He was here at the clinic on March 2 or getting medication for his history of anxiety attacks. During his clinic visit, he went into SVT at a rate of 146 beats per minute documented by EKG. He broke spontaneously before going to the emergency room. Blood work there was normal as was chest x-ray and his EKG is also normal. He did not have an echocardiogram nor TSH.  He says this is been going on for about 2 years. It happens spontaneously and usually breaks on its own. He is very concerned a day and has quit playing basketball, is worried about working, has even quit smoking. He uses alcohol but not binge drinking. He does not use illicit drugs.  Past Medical History  Diagnosis Date  . SVT (supraventricular tachycardia)     Current Outpatient Prescriptions  Medication Sig Dispense Refill  . clonazePAM (KLONOPIN) 1 MG tablet Take 1 mg by mouth 2 (two) times daily as needed for anxiety.      . cyclobenzaprine (FLEXERIL) 10 MG tablet Take 1 tablet (10 mg total) by mouth 2 (two) times daily as needed for muscle spasms.  30 tablet  1   No current facility-administered medications for this visit.    Allergies  Allergen Reactions  . Penicillins Hives    History reviewed. No pertinent family history.  History   Social History  . Marital Status: Single    Spouse Name: N/A    Number of Children: N/A  . Years of Education: N/A   Occupational History  . Not on file.   Social History Main Topics  . Smoking status: Current Every Day Smoker  . Smokeless tobacco: Not on file  . Alcohol Use: Yes  . Drug Use: No  . Sexual Activity: Not on file   Other Topics Concern  . Not on file   Social History Narrative  . No narrative on file    ROS ALL NEGATIVE EXCEPT THOSE NOTED IN HPI  PE  General Appearance: well developed, well nourished in no acute distress HEENT: symmetrical face, PERRLA, good dentition   Neck: no JVD, thyromegaly, or adenopathy, trachea midline Chest: symmetric without deformity Cardiac: PMI non-displaced, RRR, normal S1, S2, no gallop or murmur Lung: clear to ausculation and percussion Vascular: all pulses full without bruits  Abdominal: nondistended, nontender, good bowel sounds, no HSM, no bruits Extremities: no cyanosis, clubbing or edema, no sign of DVT, no varicosities  Skin: normal color, no rashes Neuro: alert and oriented x 3, non-focal Pysch: normal affect  EKG Not repeated  BMET    Component Value Date/Time   NA 140 02/03/2014 1352   K 4.1 02/03/2014 1352   CL 103 02/03/2014 1352   CO2 26 02/03/2014 1352   GLUCOSE 100* 02/03/2014 1352   BUN 14 02/03/2014 1352   CREATININE 0.90 02/03/2014 1352   CREATININE 1.06 05/02/2013 1219   CALCIUM 9.4 02/03/2014 1352   GFRNONAA >90 02/03/2014 1352   GFRAA >90 02/03/2014 1352    Lipid Panel  No results found for this basename: chol, trig, hdl, cholhdl, vldl, ldlcalc    CBC    Component Value Date/Time   WBC 3.2* 02/03/2014 1352   RBC 4.49 02/03/2014 1352   HGB 14.0 02/03/2014 1352   HCT 40.0 02/03/2014 1352   PLT 235 02/03/2014 1352   MCV 89.1 02/03/2014 1352   MCH 31.2 02/03/2014 1352   MCHC 35.0 02/03/2014  1352   RDW 12.0 02/03/2014 1352   LYMPHSABS 1.9 05/02/2013 1219   MONOABS 0.4 05/02/2013 1219   EOSABS 0.0 05/02/2013 1219   BASOSABS 0.0 05/02/2013 1219

## 2014-02-13 LAB — TSH: TSH: 1.525 u[IU]/mL (ref 0.350–4.500)

## 2014-02-13 NOTE — Telephone Encounter (Signed)
Will see Dr Lovena Le on 02/26/14

## 2014-02-21 ENCOUNTER — Ambulatory Visit (HOSPITAL_COMMUNITY)
Admission: RE | Admit: 2014-02-21 | Discharge: 2014-02-21 | Disposition: A | Discharge status: Home / Self Care | Payer: Medicaid - Out of State | Source: Ambulatory Visit | Attending: Cardiology | Admitting: Cardiology

## 2014-02-21 ENCOUNTER — Encounter: Payer: Self-pay | Admitting: Cardiology

## 2014-02-21 DIAGNOSIS — I498 Other specified cardiac arrhythmias: Secondary | ICD-10-CM | POA: Insufficient documentation

## 2014-02-21 DIAGNOSIS — I471 Supraventricular tachycardia, unspecified: Secondary | ICD-10-CM

## 2014-02-21 NOTE — Progress Notes (Signed)
  Echocardiogram 2D Echocardiogram has been performed.  Basilia Jumbo 02/21/2014, 3:43 PM

## 2014-02-26 ENCOUNTER — Encounter: Payer: Self-pay | Admitting: Internal Medicine

## 2014-02-26 ENCOUNTER — Ambulatory Visit (INDEPENDENT_AMBULATORY_CARE_PROVIDER_SITE_OTHER): Payer: Medicaid - Out of State | Admitting: Internal Medicine

## 2014-02-26 ENCOUNTER — Encounter: Payer: Self-pay | Admitting: *Deleted

## 2014-02-26 VITALS — BP 132/81 | HR 63 | Ht 72.0 in | Wt 141.0 lb

## 2014-02-26 DIAGNOSIS — I498 Other specified cardiac arrhythmias: Secondary | ICD-10-CM

## 2014-02-26 DIAGNOSIS — I471 Supraventricular tachycardia: Secondary | ICD-10-CM

## 2014-02-26 LAB — BASIC METABOLIC PANEL
BUN: 10 mg/dL (ref 6–23)
CALCIUM: 9.7 mg/dL (ref 8.4–10.5)
CO2: 32 mEq/L (ref 19–32)
CREATININE: 0.9 mg/dL (ref 0.4–1.5)
Chloride: 102 mEq/L (ref 96–112)
GFR: 122.46 mL/min (ref >60.00)
Glucose, Bld: 79 mg/dL (ref 70–99)
Potassium: 3.4 mEq/L — ABNORMAL LOW (ref 3.5–5.1)
Sodium: 138 mEq/L (ref 135–145)

## 2014-02-26 LAB — CBC WITH DIFFERENTIAL/PLATELET
BASOS PCT: 0.9 % (ref 0.0–3.0)
Basophils Absolute: 0 10*3/uL (ref 0.0–0.1)
EOS PCT: 0.6 % (ref 0.0–5.0)
Eosinophils Absolute: 0 10*3/uL (ref 0.0–0.7)
HCT: 43.3 % (ref 39.0–52.0)
Hemoglobin: 14.4 g/dL (ref 13.0–17.0)
LYMPHS PCT: 36.4 % (ref 12.0–46.0)
Lymphs Abs: 1.6 10*3/uL (ref 0.7–4.0)
MCHC: 33.1 g/dL (ref 30.0–36.0)
MCV: 92 fl (ref 78.0–100.0)
MONO ABS: 0.4 10*3/uL (ref 0.1–1.0)
Monocytes Relative: 9 % (ref 3.0–12.0)
Neutro Abs: 2.3 10*3/uL (ref 1.4–7.7)
Neutrophils Relative %: 53.1 % (ref 43.0–77.0)
PLATELETS: 248 10*3/uL (ref 150.0–400.0)
RBC: 4.71 Mil/uL (ref 4.22–5.81)
RDW: 12.7 % (ref 11.5–14.6)
WBC: 4.3 10*3/uL — AB (ref 4.5–10.5)

## 2014-02-26 NOTE — Progress Notes (Signed)
      HPI Mr. Villavicencio is referred today by Dr. Verl Blalock for evaluation of SVT. He is a very pleasant 33 yo man with an over 2 year history of recurrent tachypalpitations and documented SVT. He has learned to treat his episodes with vagal maneuvers and valsalva. He has not had syncope or chest pain or sob. He feels palpitations. These episodes start and stop suddenly. The longest episode has been approx. 1 hour.  Allergies  Allergen Reactions  . Penicillins Hives     Current Outpatient Prescriptions  Medication Sig Dispense Refill  . clonazePAM (KLONOPIN) 1 MG tablet Take 1 mg by mouth 2 (two) times daily as needed for anxiety.      . cyclobenzaprine (FLEXERIL) 10 MG tablet Take 1 tablet (10 mg total) by mouth 2 (two) times daily as needed for muscle spasms.  30 tablet  1   No current facility-administered medications for this visit.     Past Medical History  Diagnosis Date  . SVT (supraventricular tachycardia)   . PSVT (paroxysmal supraventricular tachycardia)   . Depression   . High risk sexual behavior   . Anxiety   . Panic attack   . Other malaise and fatigue     ROS:   All systems reviewed and negative except as noted in the HPI.   No past surgical history on file.   Family History  Problem Relation Age of Onset  . Diabetes      DM     History   Social History  . Marital Status: Married    Spouse Name: N/A    Number of Children: N/A  . Years of Education: N/A   Occupational History  . Not on file.   Social History Main Topics  . Smoking status: Current Every Day Smoker  . Smokeless tobacco: Not on file  . Alcohol Use: Yes  . Drug Use: No  . Sexual Activity: Not on file   Other Topics Concern  . Not on file   Social History Narrative  . No narrative on file     BP 132/81  Pulse 63  Ht 6' (1.829 m)  Wt 141 lb (63.957 kg)  BMI 19.12 kg/m2  Physical Exam:  Well appearing young man, NAD HEENT: Unremarkable Neck:  No JVD, no  thyromegally Back:  No CVA tenderness Lungs:  Clear with no wheezes HEART:  Regular rate rhythm, no murmurs, no rubs, no clicks Abd:  soft, positive bowel sounds, no organomegally, no rebound, no guarding Ext:  2 plus pulses, no edema, no cyanosis, no clubbing Skin:  No rashes no nodules Neuro:  CN II through XII intact, motor grossly intact  EKG NSR with no ventricular pre-excitation.  Assess/Plan:

## 2014-02-26 NOTE — Patient Instructions (Signed)

## 2014-02-26 NOTE — Assessment & Plan Note (Signed)
I have discussed the treatment options with the patient and the risk/benefits/goals/expectations of catheter ablation of SVT and he wishes to proceed.

## 2014-03-03 ENCOUNTER — Encounter (HOSPITAL_COMMUNITY): Payer: Self-pay | Admitting: Pharmacy Technician

## 2014-03-06 ENCOUNTER — Telehealth: Payer: Self-pay | Admitting: Internal Medicine

## 2014-03-06 NOTE — Telephone Encounter (Signed)
Patient no longer has insurance and he wants to know if he can still have his procedure done on Monday? Please call and advise.

## 2014-03-06 NOTE — Telephone Encounter (Signed)
I have called patient and he is going to call tomorrow and inquire more about the orange Card he was told he qualifies for.  He is going to proceed with ablation at this time

## 2014-03-10 ENCOUNTER — Encounter (HOSPITAL_COMMUNITY): Payer: Self-pay | Admitting: General Practice

## 2014-03-10 ENCOUNTER — Ambulatory Visit (HOSPITAL_COMMUNITY)
Admission: RE | Admit: 2014-03-10 | Discharge: 2014-03-11 | Disposition: A | Discharge status: Home / Self Care | Payer: Medicaid - Out of State | Source: Ambulatory Visit | Attending: Internal Medicine | Admitting: Internal Medicine

## 2014-03-10 ENCOUNTER — Encounter (HOSPITAL_COMMUNITY)
Admission: RE | Disposition: A | Discharge status: Home / Self Care | Payer: Medicaid - Out of State | Source: Ambulatory Visit | Attending: Internal Medicine

## 2014-03-10 DIAGNOSIS — I498 Other specified cardiac arrhythmias: Secondary | ICD-10-CM | POA: Insufficient documentation

## 2014-03-10 DIAGNOSIS — I471 Supraventricular tachycardia, unspecified: Secondary | ICD-10-CM | POA: Diagnosis present

## 2014-03-10 DIAGNOSIS — I4891 Unspecified atrial fibrillation: Secondary | ICD-10-CM

## 2014-03-10 DIAGNOSIS — F3289 Other specified depressive episodes: Secondary | ICD-10-CM | POA: Insufficient documentation

## 2014-03-10 DIAGNOSIS — Z7251 High risk heterosexual behavior: Secondary | ICD-10-CM | POA: Insufficient documentation

## 2014-03-10 DIAGNOSIS — F410 Panic disorder [episodic paroxysmal anxiety] without agoraphobia: Secondary | ICD-10-CM | POA: Insufficient documentation

## 2014-03-10 DIAGNOSIS — F411 Generalized anxiety disorder: Secondary | ICD-10-CM | POA: Insufficient documentation

## 2014-03-10 DIAGNOSIS — F329 Major depressive disorder, single episode, unspecified: Secondary | ICD-10-CM | POA: Insufficient documentation

## 2014-03-10 HISTORY — PX: SUPRAVENTRICULAR TACHYCARDIA ABLATION: SHX5492

## 2014-03-10 HISTORY — PX: ABLATION: SHX5711

## 2014-03-10 HISTORY — DX: Unspecified atrial fibrillation: I48.91

## 2014-03-10 LAB — POCT ACTIVATED CLOTTING TIME
ACTIVATED CLOTTING TIME: 171 s
ACTIVATED CLOTTING TIME: 193 s
Activated Clotting Time: 171 seconds

## 2014-03-10 LAB — BASIC METABOLIC PANEL
BUN: 12 mg/dL (ref 6–23)
CO2: 27 mEq/L (ref 19–32)
CREATININE: 0.96 mg/dL (ref 0.50–1.35)
Calcium: 9.1 mg/dL (ref 8.4–10.5)
Chloride: 100 mEq/L (ref 96–112)
GFR calc Af Amer: >90 mL/min (ref >90)
Glucose, Bld: 113 mg/dL — ABNORMAL HIGH (ref 70–99)
Potassium: 3.8 mEq/L (ref 3.7–5.3)
Sodium: 138 mEq/L (ref 137–147)

## 2014-03-10 SURGERY — SUPRAVENTRICULAR TACHYCARDIA ABLATION
Anesthesia: LOCAL

## 2014-03-10 MED ORDER — BUPIVACAINE HCL (PF) 0.25 % IJ SOLN
INTRAMUSCULAR | Status: AC
  Filled 2014-03-10: qty 60

## 2014-03-10 MED ORDER — HEPARIN SODIUM (PORCINE) 1000 UNIT/ML IJ SOLN
INTRAMUSCULAR | Status: AC
  Filled 2014-03-10: qty 1

## 2014-03-10 MED ORDER — FENTANYL CITRATE 0.05 MG/ML IJ SOLN
INTRAMUSCULAR | Status: AC
  Filled 2014-03-10: qty 2

## 2014-03-10 MED ORDER — DILTIAZEM HCL 100 MG IV SOLR
10.0000 mg/h | INTRAVENOUS | Status: DC
  Administered 2014-03-10: 10 mg/h via INTRAVENOUS
  Filled 2014-03-10: qty 100

## 2014-03-10 MED ORDER — SODIUM CHLORIDE 0.9 % IJ SOLN: 3.0000 mL | INTRAMUSCULAR | Status: DC | PRN

## 2014-03-10 MED ORDER — MIDAZOLAM HCL 5 MG/5ML IJ SOLN
INTRAMUSCULAR | Status: AC
  Filled 2014-03-10: qty 5

## 2014-03-10 MED ORDER — ONDANSETRON HCL 4 MG/2ML IJ SOLN
4.0000 mg | Freq: Four times a day (QID) | INTRAMUSCULAR | Status: DC | PRN
  Administered 2014-03-10: 4 mg via INTRAVENOUS
  Filled 2014-03-10 (×2): qty 2

## 2014-03-10 MED ORDER — ISOPROTERENOL HCL 0.2 MG/ML IJ SOLN
INTRAMUSCULAR | Status: AC
  Filled 2014-03-10: qty 5

## 2014-03-10 MED ORDER — CLONAZEPAM 1 MG PO TABS
1.0000 mg | ORAL_TABLET | Freq: Every day | ORAL | Status: DC
  Administered 2014-03-10: 1 mg via ORAL
  Filled 2014-03-10 (×2): qty 1

## 2014-03-10 MED ORDER — SODIUM CHLORIDE 0.9 % IV SOLN: 250.0000 mL | INTRAVENOUS | Status: DC | PRN

## 2014-03-10 MED ORDER — SODIUM CHLORIDE 0.9 % IJ SOLN
3.0000 mL | Freq: Two times a day (BID) | INTRAMUSCULAR | Status: DC
  Administered 2014-03-10: 3 mL via INTRAVENOUS

## 2014-03-10 MED ORDER — FLECAINIDE ACETATE 100 MG PO TABS
200.0000 mg | ORAL_TABLET | Freq: Once | ORAL | Status: AC
  Administered 2014-03-10: 200 mg via ORAL
  Filled 2014-03-10: qty 2

## 2014-03-10 NOTE — H&P (View-Only) (Signed)
      HPI Mr. Sitts is referred today by Dr. Wall for evaluation of SVT. He is a very pleasant 33 yo man with an over 2 year history of recurrent tachypalpitations and documented SVT. He has learned to treat his episodes with vagal maneuvers and valsalva. He has not had syncope or chest pain or sob. He feels palpitations. These episodes start and stop suddenly. The longest episode has been approx. 1 hour.  Allergies  Allergen Reactions  . Penicillins Hives     Current Outpatient Prescriptions  Medication Sig Dispense Refill  . clonazePAM (KLONOPIN) 1 MG tablet Take 1 mg by mouth 2 (two) times daily as needed for anxiety.      . cyclobenzaprine (FLEXERIL) 10 MG tablet Take 1 tablet (10 mg total) by mouth 2 (two) times daily as needed for muscle spasms.  30 tablet  1   No current facility-administered medications for this visit.     Past Medical History  Diagnosis Date  . SVT (supraventricular tachycardia)   . PSVT (paroxysmal supraventricular tachycardia)   . Depression   . High risk sexual behavior   . Anxiety   . Panic attack   . Other malaise and fatigue     ROS:   All systems reviewed and negative except as noted in the HPI.   No past surgical history on file.   Family History  Problem Relation Age of Onset  . Diabetes      DM     History   Social History  . Marital Status: Married    Spouse Name: N/A    Number of Children: N/A  . Years of Education: N/A   Occupational History  . Not on file.   Social History Main Topics  . Smoking status: Current Every Day Smoker  . Smokeless tobacco: Not on file  . Alcohol Use: Yes  . Drug Use: No  . Sexual Activity: Not on file   Other Topics Concern  . Not on file   Social History Narrative  . No narrative on file     BP 132/81  Pulse 63  Ht 6' (1.829 m)  Wt 141 lb (63.957 kg)  BMI 19.12 kg/m2  Physical Exam:  Well appearing young man, NAD HEENT: Unremarkable Neck:  No JVD, no  thyromegally Back:  No CVA tenderness Lungs:  Clear with no wheezes HEART:  Regular rate rhythm, no murmurs, no rubs, no clicks Abd:  soft, positive bowel sounds, no organomegally, no rebound, no guarding Ext:  2 plus pulses, no edema, no cyanosis, no clubbing Skin:  No rashes no nodules Neuro:  CN II through XII intact, motor grossly intact  EKG NSR with no ventricular pre-excitation.  Assess/Plan: 

## 2014-03-10 NOTE — Progress Notes (Signed)
Pt had non sustained HR in 180s afib. Pt dry heaving at the time. Pt states that his heart "feels like it is racing" and that he feels "nauseous like I could throw up". VSS. Dr Lovena Le paged. Dorna Bloom, RN

## 2014-03-10 NOTE — CV Procedure (Signed)
EPS/RFA of AVRT (left posteroseptal concealed, decrementally conducting) without immediate complication. [id number].

## 2014-03-10 NOTE — Interval H&P Note (Signed)
History and Physical Interval Note: since prior clinic visit,there has been no change in the history, physical exam, assessment and plan.  03/10/2014 7:19 AM  Anthony Henderson  has presented today for surgery, with the diagnosis of SVT  The various methods of treatment have been discussed with the patient and family. After consideration of risks, benefits and other options for treatment, the patient has consented to  Procedure(s): SUPRAVENTRICULAR TACHYCARDIA ABLATION (N/A) as a surgical intervention .  The patient's history has been reviewed, patient examined, no change in status, stable for surgery.  I have reviewed the patient's chart and labs.  Questions were answered to the patient's satisfaction.     Mikle Bosworth.D.

## 2014-03-11 ENCOUNTER — Other Ambulatory Visit: Payer: Self-pay | Admitting: Physician Assistant

## 2014-03-11 DIAGNOSIS — I471 Supraventricular tachycardia, unspecified: Secondary | ICD-10-CM

## 2014-03-11 MED ORDER — FLECAINIDE ACETATE 100 MG PO TABS: 100.0000 mg | ORAL_TABLET | Freq: Two times a day (BID) | ORAL | Status: DC

## 2014-03-11 NOTE — Discharge Summary (Signed)
CARDIOLOGY DISCHARGE SUMMARY   Patient ID: Anthony Henderson MRN: [id number] DOB/AGE: 03-01-1981 33 y.o.  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 male with a history of SVT. He was referred to Dr. Lovena Le by Dr. Verl Blalock for consideration of ablation. He was scheduled for the procedure and came to the hospital on 03/10/2014.  Procedure results are below. He had successful ablation of AVNRT, no complications. He developed atrial fibrillation as a consequence of the procedure, and was cardioverted, initially without success. He was admitted overnight.  On 04/07, he was seen by Dr. Lovena Le. His sites had no signs of infection and he was having no arrhythmias. He had spontaneously gone back into sinus rhythm. He was evaluated and considered stable for discharge, to follow up as an outpatient.  Labs:   Lab Results  Component Value Date   WBC 4.3* 02/26/2014   HGB 14.4 02/26/2014   HCT 43.3 02/26/2014   MCV 92.0 02/26/2014   PLT 248.0 02/26/2014     Recent Labs Lab 03/10/14 0611  NA 138  K 3.8  CL 100  CO2 27  BUN 12  CREATININE 0.96  CALCIUM 9.1  GLUCOSE 113*   EPS: 03/10/2014 RESULTS: A. Baseline ECG: Baseline ECG demonstrates normal sinus  rhythm with normal axis and intervals.  B. Baseline intervals: The sinus node cycle length was 900  milliseconds. The AH interval was 64 milliseconds, the HV interval was  46 milliseconds, and the QRS duration was 86 milliseconds.  C. Rapid ventricular pacing: Rapid ventricular pacing was carried out  from the right ventricle demonstrating VA dissociation at 600  milliseconds. During isoproterenol  infusion, rapid ventricular pacing  was carried out demonstrating a VA Wenckebach cycle length of 340  milliseconds.  D. Programmed ventricular stimulation: Programmed ventricular  stimulation was carried out from the right ventricle demonstrating VA  dissociation at 600 milliseconds. On isoproterenol, the retrograde AV  node ERP was 400/290. During rapid ventricular pacing, on  isoproterenol, there was what appeared to be midline activation of the  atrium, but prior to induction of the tachycardia with ventricular  pacing, there was block in AV node and conduction through a  posteroseptal accessory pathway. The close proximity of the patient's  accessory pathway to the AV node made mapping of the activation sequence  very difficult.  E. Programmed atrial stimulation: Programmed atrial stimulation was  carried out from the atrium at a base drive cycle length of 600  milliseconds. The S1-S2 interval was stepwise decreased to 340  milliseconds where the AV node ERP was observed. During programmed  atrial stimulation, there were multiple AH jumps and echo beats, but no  sustained tachycardia. Following initiation of isoproterenol, there was  inducible SVT at 500/330.  F. Rapid atrial pacing: Rapid atrial pacing was carried out from the  atrium and stepwise decreased down to 380 milliseconds where AV  Wenckebach was observed. During rapid atrial pacing, there was no  inducible SVT.  G. Arrhythmias observed:  1. AV reentrant tachycardia initiation was with rapid ventricular  pacing and programed ventricular stimulation. The duration was  sustained, termination was spontaneous.  2. Atrial fibrillation initiation was spontaneous and the duration was  sustained. Termination was with  DC cardioversion.  a. Mapping: Mapping of the patient's accessory pathway  demonstrated the left posteroseptal accessory pathway. We  actually mapped early atrial activation during tachycardia inside  the  coronary sinus ostium as well as on the left posteroseptal  space accessed by way of the aortic valve onto the mitral valve  annulus. Extensive RF energy application was applied to both  areas. Because the patient subsequently developed atrial  fibrillation during RF energy application inside the coronary  sinus, we could not assess whether or not that the ablation  procedure was successful at the end of the case because of his  incessant atrial fibrillation, which was not preexcited.  b. RF energy application: A total of 15 RF energy applications  were delivered. Two RF energy applications were delivered to the  right posteroseptal space, 10 RF energy applications were  delivered to the left posteroseptal space applied both onto the  atrial insertion as well as the ventricular insertion of the  mitral valve annulus. Finally, 3 RF energy applications were  delivered inside the coronary sinus ostium.  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 The patient developed atrial fibrillation at  the end of the procedure and the final procedural success could not be  verified. It should be noted that the patient was cardioverted during  the procedure and had immediate return of atrial fibrillation.  EKG: 03/11/2014 SR Vent. rate 75 BPM PR interval 146 ms QRS duration 100 ms QT/QTc 378/422 ms P-R-T axes 69 65 60   FOLLOW UP PLANS AND APPOINTMENTS Allergies  Allergen Reactions  . Penicillins Hives     Medication List         clonazePAM 1 MG tablet  Commonly known as:  KLONOPIN  Take 1 mg by mouth daily as needed for anxiety.     flecainide 100 MG tablet  Commonly known as:  TAMBOCOR  Take 1 tablet (100 mg total) by mouth 2 (two) times daily.        Discharge Orders   Future Appointments Provider Department Dept Phone   03/25/2014 2:00 PM Liliane Shi,  PA-C [place] Office [phone]   [facility] [place] Office [phone]   03/25/2014 3:00 PM Evans Lance, MD [facility] [phone]   Future Orders Complete By Expires   Diet - low sodium heart healthy  As directed    Increase activity slowly  As directed      Follow-up Information   Follow up with [place] On 03/25/2014. (Come at 1:45 pm for a 2:00 treadmill. Wear comfortable clothes and shoes, OK to have a light lunch.)    Contact information:   [place] [phone]       Follow up with Cristopher Peru, MD On 03/25/2014. (at 3:00 pm)    Specialty:  Cardiology   Contact information:   [address] [address] [phone]       BRING ALL MEDICATIONS WITH YOU TO FOLLOW UP APPOINTMENTS  Time spent with patient to include physician time: 36 min Signed: Rosaria Ferries, PA-C 03/11/2014, 12:16 PM Co-Sign MD

## 2014-03-11 NOTE — Progress Notes (Signed)
Patient ID: NISHANT SCHRECENGOST, male   DOB: [birth year], 33 y.o.   MRN: [id number]   Patient Name: Anthony Henderson Date of Encounter: 03/11/2014     Active Problems:   Paroxysmal supraventricular tachycardia    SUBJECTIVE S/p EPS/RFA of a concealed left postero septal AP, complicated by atrial fib. No chest pain or sob  CURRENT MEDS . clonazePAM  1 mg Oral Daily  . sodium chloride  3 mL Intravenous Q12H    OBJECTIVE  Filed Vitals:   03/10/14 1657 03/10/14 1714 03/10/14 2043 03/11/14 0436  BP: 99/75 111/76 112/81 102/70  Pulse: 116 103 80 77  Temp:   98.8 F (37.1 C) 98.3 F (36.8 C)  TempSrc:   Oral Oral  Resp:   20 20  Height:      Weight:    139 lb 15.5 oz (63.488 kg)  SpO2:   100% 100%   No intake or output data in the 24 hours ending 03/11/14 1035 Filed Weights   03/10/14 0555 03/11/14 0436  Weight: 140 lb (63.504 kg) 139 lb 15.5 oz (63.488 kg)    PHYSICAL EXAM  General: Pleasant, NAD. Neuro: Alert and oriented X 3. Moves all extremities spontaneously. Psych: Normal affect. HEENT:  Normal  Neck: Supple without bruits or JVD. Lungs:  Resp regular and unlabored, CTA. Heart: RRR no s3, s4, or murmurs. Abdomen: Soft, non-tender, non-distended, BS + x 4.  Extremities: No clubbing, cyanosis or edema. DP/PT/Radials 2+ and equal bilaterally. NO hematoma.  Accessory Clinical Findings  CBC No results found for this basename: WBC, NEUTROABS, HGB, HCT, MCV, PLT,  in the last 72 hours Basic Metabolic Panel  Recent Labs  03/10/14 0611  NA 138  K 3.8  CL 100  CO2 27  GLUCOSE 113*  BUN 12  CREATININE 0.96  CALCIUM 9.1   Liver Function Tests No results found for this basename: AST, ALT, ALKPHOS, BILITOT, PROT, ALBUMIN,  in the last 72 hours No results found for this basename: LIPASE, AMYLASE,  in the last 72 hours Cardiac Enzymes No results found for this basename: CKTOTAL, CKMB, CKMBINDEX, TROPONINI,  in the last 72 hours BNP No components found with this  basename: POCBNP,  D-Dimer No results found for this basename: DDIMER,  in the last 72 hours Hemoglobin A1C No results found for this basename: HGBA1C,  in the last 72 hours Fasting Lipid Panel No results found for this basename: CHOL, HDL, LDLCALC, TRIG, CHOLHDL, LDLDIRECT,  in the last 72 hours Thyroid Function Tests No results found for this basename: TSH, T4TOTAL, FREET3, T3FREE, THYROIDAB,  in the last 72 hours  TELE nsr   Radiology/Studies  No results found.  ASSESSMENT AND PLAN 1. SVT, s/p ablation 2. Atrial fib occuring as a consequence of ablation Rec: ok for discharge home. I would like him to go home on Flecainide 100 mg twice daily. Stress test with me in 2-3 weeks.  Melaina Howerton,M.D.  03/11/2014 10:35 AM

## 2014-03-11 NOTE — Progress Notes (Signed)
Pt provided with dc instructions and education. Pt verbalized undersatnding. Pt has no questions or needs at this time. IV removed with tip intact. Heart monitor cleaned and returned to front. Meriam Sprague, RN

## 2014-03-11 NOTE — Op Note (Signed)
NAMEKEANU, LESNIAK NO.:  [id number]  MEDICAL RECORD NO.:  [id number]  LOCATION:  [id number]                        FACILITY:  [facility]  PHYSICIAN:  Champ Mungo. Lovena Le, MD    DATE OF BIRTH:  1981/06/27  DATE OF PROCEDURE:  03/10/2014 DATE OF DISCHARGE:                              OPERATIVE REPORT   PROCEDURE PERFORMED:  Electrophysiologic study and RF catheter ablation of a concealed, decrementally conducting posteroseptal accessory pathway.  INTRODUCTION:  The patient is a 33 year old male with a history of longstanding tachypalpitations, which have increased in frequency and severity.  He has failed medical therapy with beta-blockers.  He has had documented SVT terminated with adenosine on multiple occasions at rates of up to 180 beats per minute.  He is now referred for catheter ablation.  DESCRIPTION OF PROCEDURE:  After informed consent was obtained, the patient was taken to the Diagnostic EP Lab in the fasting state.  After usual preparation and draping, intravenous fentanyl and Versed were given for sedation.  A 6-French hexapolar catheter was inserted percutaneously into the right jugular vein and advanced to the coronary sinus.  A 6-French quadripolar catheter was inserted percutaneously into the right femoral vein and advanced to the His bundle.  A 6-French quadripolar catheter was inserted percutaneously into the right femoral vein and advanced to the right ventricle.  After measurement of the basic intervals, rapid ventricular pacing was carried out from the right ventricle at a pacing cycle length of 600 milliseconds demonstrating VA dissociation.  During rapid ventricular pacing, the atrial activation appeared to be earliest in the His bundle area.  Programmed ventricular stimulation was then carried out demonstrating VA dissociation.  Rapid atrial pacing was carried out demonstrating an AV Wenckebach cycle length of 480 milliseconds.  Programmed atrial  stimulation was carried out at a base drive cycle length of 600 milliseconds and the S1-S2 interval stepwise decreased down to 340 milliseconds where the AV node ERP was observed.  During programmed atrial stimulation, there were multiple AH jumps and echo beats, but no inducible SVT.  At this point, isoproterenol was infused at rates from 1-4 mcg per minute.  Rapid ventricular pacing was carried out demonstrating initiation of SVT. Mapping of the SVT demonstrated that the earliest atrial activation was in the coronary sinus 5-6 bipolar electrode, which corresponded to a location approximately 7 o'clock on the mitral valve annulus.  Mapping of the PVCs was placed at the time of His bundle refractoriness, but this clearly did not pre-excite the atrium nor that they post-excite the atrium.  Rapid ventricular pacing during tachycardia did demonstrate a V- A-V conduction sequence.  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  A 7-French quadripolar ablation catheter was then maneuvered into the region of Koch's triangle.  Mapping was carried out.  It was interesting, however, that the [place] time was fairly long for AV node reentrant tachycardia.  This raised a suspicion of an unusually conducting posterior accessory pathway.  This pathway would have had decremental conducting properties.  Two RF energy applications were subsequently delivered to sites 8 through 10  in Koch's triangle.  This demonstrated accelerated junctional rhythm, but then had no affect on the inducibility of the tachycardia.  Additional mapping, however, during tachycardia demonstrated that the earliest atrial activation was clearly in the CS56 before the His A indicative of a posteroseptal pathway involving the left posteroseptal space.  At this point, a 7- [place] quadripolar ablation catheter was inserted percutaneously  through the right femoral artery and advanced under fluoroscopic guidance retrograde across the aortic valve into the left ventricle.  5000 units of heparin was given.  Invasive arterial monitoring was carried out.  At this point, we struggled to map the tachycardia because it was fairly difficult to induce despite isoproterenol, and the activation sequence was very similar to the activation sequence through the AV node.  During tachycardia, however, it was very clear that the earliest atrial activation was in the proximal coronary sinus at a location between 6 and 7 o'clock on the mitral valve annulus.  The ablation catheter was then maneuvered into this area and 10 RF energy applications were applied to both atrial and ventricular insertion of the accessory pathway.  Doing so, did not have any effect on the patient's inducibility, but did slow the patient's tachycardia down with heart rates in tachycardia decreasing down to around 140 or 50 beats per minute from 170 to 80 beats per minute.  At this point, the ablation catheter was removed from the femoral artery and placed back into the right femoral vein and advanced into the coronary sinus where 3 additional RF energy applications were delivered.  During RF energy application and the coronary sinus, the patient developed atrial fibrillation.  We converted the patient once with a DC shock at 200 joules after giving him additional Versed, but after a time, he right back into atrial fibrillation.  There was no antegrade conduction of this patient's pathway and we were stuck with the inability to map the patient's atrial activation in atrial fibrillation and no ability to map ventricle activation as there was no antegrade pathway conduction.  At this point with no way to assess success of the procedure and with the patient having incessant atrial fibrillation, it was deemed most appropriate to discontinue the case and returned the patient  to the holding area for removal of sheaths.  Prior to this, an ACT was obtained.  COMPLICATIONS:  There were no immediate procedure complications.  RESULTS:  A.  Baseline ECG:  Baseline ECG demonstrates normal sinus rhythm with normal axis and intervals. B.  Baseline intervals:  The sinus node cycle length was 900 milliseconds.  The AH interval was 64 milliseconds, the HV interval was 46 milliseconds, and the QRS duration was 86 milliseconds. C.  Rapid ventricular pacing:  Rapid ventricular pacing was carried out from the right ventricle demonstrating VA dissociation at 600 milliseconds.  During isoproterenol infusion, rapid ventricular pacing was carried out demonstrating a VA Wenckebach cycle length of 340 milliseconds. D.  Programmed ventricular stimulation:  Programmed ventricular stimulation was carried out from the right ventricle demonstrating VA dissociation at 600 milliseconds.  On isoproterenol, the retrograde AV node ERP was 400/290.  During rapid ventricular pacing, on isoproterenol, there was what appeared to be midline activation of the atrium, but prior to induction of the tachycardia with ventricular pacing, there was block in AV node and conduction through a posteroseptal accessory pathway.  The close proximity of the patient's accessory pathway to the AV node made mapping of the activation sequence very difficult. E.  Programmed atrial  stimulation:  Programmed atrial stimulation was carried out from the atrium at a base drive cycle length of 600 milliseconds.  The S1-S2 interval was stepwise decreased to 340 milliseconds where the AV node ERP was observed.  During programmed atrial stimulation, there were multiple AH jumps and echo beats, but no sustained tachycardia.  Following initiation of isoproterenol, there was inducible SVT at 500/330. F.  Rapid atrial pacing:  Rapid atrial pacing was carried out from the atrium and stepwise decreased down to 380  milliseconds where AV Wenckebach was observed.  During rapid atrial pacing, there was no inducible SVT. G.  Arrhythmias observed: 1. AV reentrant tachycardia initiation was with rapid ventricular     pacing and programed ventricular stimulation.  The duration was     sustained, termination was spontaneous. 2. Atrial fibrillation initiation was spontaneous and the duration was     sustained.  Termination was with DC cardioversion.     a.     Mapping:  Mapping of the patient's accessory pathway      demonstrated the left posteroseptal accessory pathway.  We      actually mapped early atrial activation during tachycardia inside      the coronary sinus ostium as well as on the left posteroseptal      space accessed by way of the aortic valve onto the mitral valve      annulus.  Extensive RF energy application was applied to both      areas.  Because the patient subsequently developed atrial      fibrillation during RF energy application inside the coronary      sinus, we could not assess whether or not that the ablation      procedure was successful at the end of the case because of his      incessant atrial fibrillation, which was not preexcited.     b.     RF energy application:  A total of 15 RF energy applications      were delivered.  Two RF energy applications were delivered to the      right posteroseptal space, 10 RF energy applications were      delivered to the left posteroseptal space applied both onto the      atrial insertion as well as the ventricular insertion of the      mitral valve annulus.  Finally, 3 RF energy applications were      delivered inside the coronary sinus ostium.  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  The patient developed atrial fibrillation at the end of the procedure and the final procedural success could not be verified.  It  should be noted that the patient was cardioverted during the procedure and had immediate return of atrial fibrillation.     Champ Mungo. Lovena Le, MD     GWT/MEDQ  D:  03/10/2014  T:  03/11/2014  Job:  [id number]  cc:   Thomas C. Wall, MD, [facility]

## 2014-03-17 ENCOUNTER — Telehealth: Payer: Self-pay | Admitting: Internal Medicine

## 2014-03-17 NOTE — Telephone Encounter (Signed)
Fluttering at first when he went home but nothing since Wed. Feels the medication is making you dizzy.  Wants to know if he can take 50mg  twice daily and if he needs to increase.  He just wants to be normal

## 2014-03-17 NOTE — Telephone Encounter (Signed)
New message     Pt want to take flecainide once a day instead of twice of day.  Twice a day makes him feel dizzy, weak and tired.  He has to return to work tomorrow and cannot work like this.  He started taking the medication once a day Saturday and does not feel weak ,dizzy or tired.  Will this be ok?

## 2014-03-18 ENCOUNTER — Encounter (HOSPITAL_COMMUNITY): Payer: Self-pay | Admitting: *Deleted

## 2014-03-18 NOTE — Telephone Encounter (Signed)
Discussed with Dr Vergia Alberts for him to try 50mg  bid and I will follow up with him to see how he is feeling

## 2014-03-21 ENCOUNTER — Telehealth: Payer: Self-pay | Admitting: Internal Medicine

## 2014-03-21 NOTE — Telephone Encounter (Signed)
Spoke with patient and he feels better  Dizziness is better but will keep apt for next week for stress test

## 2014-03-21 NOTE — Telephone Encounter (Signed)
New message     Returning a nurses call from late yesterday

## 2014-03-25 ENCOUNTER — Ambulatory Visit (INDEPENDENT_AMBULATORY_CARE_PROVIDER_SITE_OTHER): Payer: Self-pay | Admitting: Physician Assistant

## 2014-03-25 ENCOUNTER — Encounter: Payer: Self-pay | Admitting: Internal Medicine

## 2014-03-25 DIAGNOSIS — I498 Other specified cardiac arrhythmias: Secondary | ICD-10-CM

## 2014-03-25 DIAGNOSIS — I471 Supraventricular tachycardia: Secondary | ICD-10-CM

## 2014-03-25 NOTE — Progress Notes (Signed)
Exercise Treadmill Test  Pre-Exercise Testing Evaluation Rhythm: normal sinus  Rate: 70 bpm     Test  Exercise Tolerance Test Ordering MD: Cristopher Peru, MD  Interpreting MD: Richardson Dopp, PA-C  Unique Test No: 1  Treadmill:  1  Indication for ETT: SVT/Flecainide  Contraindication to ETT: No   Stress Modality: exercise - treadmill  Cardiac Imaging Performed: non   Protocol: standard Bruce - maximal  Max BP:  163/78  Max MPHR (bpm):  186 85% MPR (bpm):  158  MPHR obtained (bpm):  176 % MPHR obtained:  94  Reached 85% MPHR (min:sec):  12:15 Total Exercise Time (min-sec):  13:00  Workload in METS:  15.2 Borg Scale: 19  Reason ETT Terminated:  patient's desire to stop    ST Segment Analysis At Rest: normal ST segments - no evidence of significant ST depression With Exercise: no evidence of significant ST depression  Other Information Arrhythmia:  2 ventricular couplets at peak exercise Angina during ETT:  absent (0) Quality of ETT:  diagnostic  ETT Interpretation:  normal - no evidence of ischemia by ST analysis  Comments: Excellent exercise capacity. No chest pain. Normal BP response to exercise. No ST-T changes to suggest ischemia.  There were 2 ventricular couplets at peak exercise but no sustained ventricular arrhythmias.  Recommendations: Of note, patient did not take Flecainide today.  Last dose was 10 pm last night.   Signed,  Richardson Dopp, PA-C   03/25/2014 3:00 PM

## 2014-05-02 ENCOUNTER — Telehealth: Payer: Self-pay | Admitting: Internal Medicine

## 2014-05-02 NOTE — Telephone Encounter (Signed)
New problem   Pt has questions about does he need to stay on his flecainid. Please call pt.

## 2014-05-02 NOTE — Telephone Encounter (Addendum)
Tried to call the home number and got a fast busy the cell number is off

## 2014-05-05 NOTE — Telephone Encounter (Addendum)
Spoke with patient. Had to use 336 area code to get thru on the phone. States he feels well without any complaints. Was supposed to take Flecainide for 6 weeks post procedure and states that it has been 9 weeks. Just finished last tab. Wants to know if he can stay off the medication. Advised will ask Dr.Taylor tomorrow and call him back. Will forward message to Dr.Taylor and Janan Halter RN.

## 2014-05-05 NOTE — Telephone Encounter (Signed)
New message     Calling to see if he needs to stay of flecainide.  Pt said no one returned his call from friday

## 2014-05-06 NOTE — Telephone Encounter (Signed)
Discussed with Dr Lovena Le.  Okay to stop Flecainide   I have called the patient.  He is aware, feels great

## 2014-08-27 ENCOUNTER — Other Ambulatory Visit: Payer: Self-pay | Admitting: Emergency Medicine

## 2014-08-27 ENCOUNTER — Encounter: Payer: Self-pay | Admitting: Internal Medicine

## 2014-08-27 ENCOUNTER — Telehealth: Payer: Self-pay | Admitting: Emergency Medicine

## 2014-08-27 ENCOUNTER — Ambulatory Visit: Payer: Self-pay | Attending: Internal Medicine | Admitting: Internal Medicine

## 2014-08-27 ENCOUNTER — Telehealth: Payer: Self-pay | Admitting: Internal Medicine

## 2014-08-27 VITALS — BP 123/89 | HR 70 | Temp 98.0°F | Resp 16 | Wt 137.6 lb

## 2014-08-27 DIAGNOSIS — I498 Other specified cardiac arrhythmias: Secondary | ICD-10-CM | POA: Insufficient documentation

## 2014-08-27 DIAGNOSIS — Z87891 Personal history of nicotine dependence: Secondary | ICD-10-CM | POA: Insufficient documentation

## 2014-08-27 DIAGNOSIS — I4891 Unspecified atrial fibrillation: Secondary | ICD-10-CM | POA: Insufficient documentation

## 2014-08-27 DIAGNOSIS — K219 Gastro-esophageal reflux disease without esophagitis: Secondary | ICD-10-CM | POA: Insufficient documentation

## 2014-08-27 DIAGNOSIS — Z79899 Other long term (current) drug therapy: Secondary | ICD-10-CM | POA: Insufficient documentation

## 2014-08-27 DIAGNOSIS — F411 Generalized anxiety disorder: Secondary | ICD-10-CM | POA: Insufficient documentation

## 2014-08-27 MED ORDER — PANTOPRAZOLE SODIUM 20 MG PO TBEC: 20.0000 mg | DELAYED_RELEASE_TABLET | Freq: Every day | ORAL | Status: DC

## 2014-08-27 MED ORDER — OMEPRAZOLE 40 MG PO CPDR: 40.0000 mg | DELAYED_RELEASE_CAPSULE | Freq: Every day | ORAL | Status: DC

## 2014-08-27 NOTE — Telephone Encounter (Signed)
Pt prescribed Protonix 20 mg tablet due to cost of prescribed Prilosec Medication e-scribed to [facility]

## 2014-08-27 NOTE — Telephone Encounter (Signed)
Pt states Prilosec script is too expensive to purchase and would like to know if there are alternative options for script other than OTC.  For now pt will use OTC Prilosec.

## 2014-08-27 NOTE — Progress Notes (Signed)
MRN: [id number] Name: Anthony Henderson  Sex: male Age: 33 y.o. DOB: 1981-10-28  Allergies: Penicillins  Chief Complaint  Patient presents with  . Gastrophageal Reflux    HPI: Patient is 33 y.o. male who comes today reported to have burning sensation which starts from the epigastric area goes all the way up in his chest which has been going on for the last few weeks as per patient he has tried over-the-counter medication with some improvement in the symptoms, as per patient in the past her he was prescribed some heartburn medication which used to help him, currently denies smoking cigarettes denies drinking alcohol has occasional nausea and vomiting, denies any change in bowel habits as per patient her and he tried Pepto-Bismol his stool color was back but denies any bleeding.  Patient denies any fever chills chest and shortness of breath.  Past Medical History  Diagnosis Date  . SVT (supraventricular tachycardia)     s/p ablation 03-10-14 by Dr Lovena Le  . Depression   . High risk sexual behavior   . Other malaise and fatigue   . Anxiety     "related to SVT"  . Panic attack   . Atrial fibrillation     following RFCA of SVT 03/2014 - on Flecainide    Past Surgical History  Procedure Laterality Date  . Ablation  03/10/2014    RFCA of left posterior septal accessory pathyway by Dr Lovena Le with resultant atrial fibrillation      Medication List       This list is accurate as of: 08/27/14 11:36 AM.  Always use your most recent med list.               clonazePAM 1 MG tablet  Commonly known as:  KLONOPIN  Take 1 mg by mouth daily as needed for anxiety.     flecainide 100 MG tablet  Commonly known as:  TAMBOCOR  Take 1 tablet (100 mg total) by mouth 2 (two) times daily.     omeprazole 40 MG capsule  Commonly known as:  PRILOSEC  Take 1 capsule (40 mg total) by mouth daily.        Meds ordered this encounter  Medications  . omeprazole (PRILOSEC) 40 MG capsule    Sig:  Take 1 capsule (40 mg total) by mouth daily.    Dispense:  30 capsule    Refill:  3    Immunization History  Administered Date(s) Administered  . Tdap 07/01/2013    Family History  Problem Relation Age of Onset  . Diabetes      DM    History  Substance Use Topics  . Smoking status: Former Smoker -- 0.25 packs/day for 10 years    Types: Cigarettes    Quit date: 02/03/2014  . Smokeless tobacco: Never Used  . Alcohol Use: Yes     Comment: 03/10/2014 "might have a few drinks 1-2 times/yr, if that"    Review of Systems   As noted in HPI  Filed Vitals:   08/27/14 1113  BP: 123/89  Pulse: 70  Temp: 98 F (36.7 C)  Resp: 16    Physical Exam  Physical Exam  Constitutional: No distress.  Eyes: EOM are normal. Pupils are equal, round, and reactive to light.  Cardiovascular: Normal rate and regular rhythm.   Pulmonary/Chest: Breath sounds normal. No respiratory distress. He has no wheezes. He has no rales.  Abdominal: Soft. Bowel sounds are normal. There is no tenderness. There  is no rebound and no guarding.  Musculoskeletal: He exhibits no edema.    CBC    Component Value Date/Time   WBC 4.3* 02/26/2014 1544   RBC 4.71 02/26/2014 1544   HGB 14.4 02/26/2014 1544   HCT 43.3 02/26/2014 1544   PLT 248.0 02/26/2014 1544   MCV 92.0 02/26/2014 1544   LYMPHSABS 1.6 02/26/2014 1544   MONOABS 0.4 02/26/2014 1544   EOSABS 0.0 02/26/2014 1544   BASOSABS 0.0 02/26/2014 1544    CMP     Component Value Date/Time   NA 138 03/10/2014 0611   K 3.8 03/10/2014 0611   CL 100 03/10/2014 0611   CO2 27 03/10/2014 0611   GLUCOSE 113* 03/10/2014 0611   BUN 12 03/10/2014 0611   CREATININE 0.96 03/10/2014 0611   CREATININE 1.06 05/02/2013 1219   CALCIUM 9.1 03/10/2014 0611   PROT 7.3 12/23/2013 1304   ALBUMIN 4.0 12/23/2013 1304   AST 23 12/23/2013 1304   ALT 13 12/23/2013 1304   ALKPHOS 47 12/23/2013 1304   BILITOT 0.4 12/23/2013 1304   GFRNONAA >90 03/10/2014 0611   GFRAA >90 03/10/2014 0611    No  results found for this basename: chol, tri, ldl    No components found with this basename: hga1c    Lab Results  Component Value Date/Time   AST 23 12/23/2013  1:04 PM    Assessment and Plan  Gastroesophageal reflux disease, esophagitis presence not specified - Plan: Advised patient for lifestyle modification, started him a omeprazole (PRILOSEC) 40 MG capsule.   Return in about 3 months (around 11/26/2014).  Lorayne Marek, MD

## 2014-08-27 NOTE — Patient Instructions (Signed)

## 2014-08-27 NOTE — Progress Notes (Signed)
Patient complains of having a burning feeling in his upper abd Whenever he eats lately.  Has had some vomiting as well

## 2014-09-17 ENCOUNTER — Encounter (HOSPITAL_COMMUNITY): Payer: Self-pay | Admitting: Emergency Medicine

## 2014-09-17 ENCOUNTER — Emergency Department (HOSPITAL_COMMUNITY)
Admission: EM | Admit: 2014-09-17 | Discharge: 2014-09-17 | Disposition: A | Discharge status: Home / Self Care | Payer: Worker's Compensation | Attending: Emergency Medicine | Admitting: Emergency Medicine

## 2014-09-17 DIAGNOSIS — F410 Panic disorder [episodic paroxysmal anxiety] without agoraphobia: Secondary | ICD-10-CM | POA: Insufficient documentation

## 2014-09-17 DIAGNOSIS — Y9389 Activity, other specified: Secondary | ICD-10-CM | POA: Insufficient documentation

## 2014-09-17 DIAGNOSIS — Z7251 High risk heterosexual behavior: Secondary | ICD-10-CM | POA: Insufficient documentation

## 2014-09-17 DIAGNOSIS — Z79899 Other long term (current) drug therapy: Secondary | ICD-10-CM | POA: Diagnosis not present

## 2014-09-17 DIAGNOSIS — S71152A Open bite, left thigh, initial encounter: Secondary | ICD-10-CM

## 2014-09-17 DIAGNOSIS — Z880 Allergy status to penicillin: Secondary | ICD-10-CM | POA: Diagnosis not present

## 2014-09-17 DIAGNOSIS — S71112A Laceration without foreign body, left thigh, initial encounter: Secondary | ICD-10-CM | POA: Insufficient documentation

## 2014-09-17 DIAGNOSIS — S71102A Unspecified open wound, left thigh, initial encounter: Secondary | ICD-10-CM | POA: Insufficient documentation

## 2014-09-17 DIAGNOSIS — Y9289 Other specified places as the place of occurrence of the external cause: Secondary | ICD-10-CM | POA: Insufficient documentation

## 2014-09-17 DIAGNOSIS — Z87891 Personal history of nicotine dependence: Secondary | ICD-10-CM | POA: Insufficient documentation

## 2014-09-17 DIAGNOSIS — W540XXA Bitten by dog, initial encounter: Secondary | ICD-10-CM | POA: Insufficient documentation

## 2014-09-17 DIAGNOSIS — I4891 Unspecified atrial fibrillation: Secondary | ICD-10-CM | POA: Insufficient documentation

## 2014-09-17 DIAGNOSIS — I471 Supraventricular tachycardia: Secondary | ICD-10-CM | POA: Diagnosis not present

## 2014-09-17 DIAGNOSIS — F329 Major depressive disorder, single episode, unspecified: Secondary | ICD-10-CM | POA: Diagnosis not present

## 2014-09-17 MED ORDER — METRONIDAZOLE 500 MG PO TABS: 500.0000 mg | ORAL_TABLET | Freq: Two times a day (BID) | ORAL | Status: DC

## 2014-09-17 MED ORDER — SULFAMETHOXAZOLE-TRIMETHOPRIM 800-160 MG PO TABS: 1.0000 | ORAL_TABLET | Freq: Two times a day (BID) | ORAL | Status: AC

## 2014-09-17 NOTE — ED Provider Notes (Signed)
CSN: [id number]     Arrival date & time 09/17/14  1241 History  This chart was scribed for a non-physician practitioner, Noland Fordyce, PA-C, working with Dot Lanes, MD by Cathie Hoops, ED Scribe. The patient was seen in WTR5/WTR5. The patient's care was started at 12:52 PM.    Chief Complaint  Patient presents with  . Animal Bite    The history is provided by the patient. No language interpreter was used.   HPI Comments: Anthony Henderson is a 33 y.o. male who presents to the Emergency Department complaining of acute, moderate dog bite to the upper left leg onset immediately prior to arrival. Pt has associated pain at the wound site. Pt's tetanus is UTD. Pt works for [employer] and was out on a call when a customers' dog bit him. The dog's owner stated the dog was vaccinated but was unable to show the pt proof of vaccination. Pt's employer states the dog has the rabies vaccine. Bleeding is well-controlled at this time. Pt denies any other symptoms at this time.   Pt is allergic to Penicillin and it produces hives.   Past Medical History  Diagnosis Date  . SVT (supraventricular tachycardia)     s/p ablation 03-10-14 by Dr Lovena Le  . Depression   . High risk sexual behavior   . Other malaise and fatigue   . Anxiety     "related to SVT"  . Panic attack   . Atrial fibrillation     following RFCA of SVT 03/2014 - on Flecainide   Past Surgical History  Procedure Laterality Date  . Ablation  03/10/2014    RFCA of left posterior septal accessory pathyway by Dr Lovena Le with resultant atrial fibrillation   Family History  Problem Relation Age of Onset  . Diabetes      DM   History  Substance Use Topics  . Smoking status: Former Smoker -- 0.25 packs/day for 10 years    Types: Cigarettes    Quit date: 02/03/2014  . Smokeless tobacco: Never Used  . Alcohol Use: Yes     Comment: 03/10/2014 "might have a few drinks 1-2 times/yr, if that"    Review of Systems  Constitutional:  Negative for fever and chills.  Musculoskeletal: Positive for arthralgias.  Skin: Positive for color change and wound.   Allergies  Penicillins  Home Medications   Prior to Admission medications   Medication Sig Start Date End Date Taking? Authorizing Provider  clonazePAM (KLONOPIN) 1 MG tablet Take 1 mg by mouth daily as needed for anxiety.    Historical Provider, MD  flecainide (TAMBOCOR) 100 MG tablet Take 1 tablet (100 mg total) by mouth 2 (two) times daily. 03/11/14   Rhonda G Barrett, PA-C  metroNIDAZOLE (FLAGYL) 500 MG tablet Take 1 tablet (500 mg total) by mouth 2 (two) times daily. One po bid x 7 days 09/17/14   Noland Fordyce, PA-C  pantoprazole (PROTONIX) 20 MG tablet Take 1 tablet (20 mg total) by mouth daily. 08/27/14   Lorayne Marek, MD  sulfamethoxazole-trimethoprim (BACTRIM DS,SEPTRA DS) 800-160 MG per tablet Take 1 tablet by mouth 2 (two) times daily. 09/17/14 09/24/14  Noland Fordyce, PA-C   Wt 141 lb (63.957 kg) Physical Exam  Nursing note and vitals reviewed. Constitutional: He is oriented to person, place, and time. He appears well-developed and well-nourished.  HENT:  Head: Normocephalic and atraumatic.  Eyes: EOM are normal.  Neck: Normal range of motion.  Cardiovascular: Normal rate.  Pulmonary/Chest: Effort normal.  Musculoskeletal: Normal range of motion.  FROM left knee and left hip.  Neurological: He is alert and oriented to person, place, and time.  Skin: Skin is warm and dry. Laceration noted. There is erythema.  Left thigh anterior aspect 2 cm crescent shaped laceration and 1 cm jagged laceration. No active bleeding. Mild erythema and tenderness. No foreign bodies.   Psychiatric: He has a normal mood and affect. His behavior is normal.    ED Course  Procedures   The wound is cleansed, debrided of foreign material as much as possible, and dressed. The patient is alerted to watch for any signs of infection (redness, pus, pain, increased swelling or  fever) and call if such occurs. Home wound care instructions are provided. Tetanus vaccination status reviewed: UTD, given in 2014.   DIAGNOSTIC STUDIES: Oxygen Saturation is 100% on RA, normal, by my interpretation.    COORDINATION OF CARE: 1:00 PM- Patient informed of current plan for treatment and evaluation and agrees with plan at this time.  MDM   Final diagnoses:  Dog bite of thigh, left, initial encounter   Pt presenting to ED with dog bit of left anterior thigh.  Bleeding controlled. Wound cleaned and bandaged. TDap UTD.  Dog has been vaccinated for rabies. Animal control contacted to evaluate dog. Home care instructions provided. Rx: bactrim and flagyl as pt is allergic to PCN and cannot have Augmentin.  Advised to f/u with PCP for wound recheck in 3-4 days. Return precautions provided. Pt verbalized understanding and agreement with tx plan.       I personally performed the services described in this documentation, which was scribed in my presence. The recorded information has been reviewed and is accurate.    Noland Fordyce, PA-C 09/17/14 [phone]

## 2014-09-17 NOTE — ED Notes (Signed)
Animal control contacted and information about case faxed. Pt was advised of this and explained to him to returnb to ED to get rabies shot if animal control concludes that dog was not vaccinated. Pt voiced understanding.

## 2014-09-17 NOTE — ED Notes (Signed)
Pt presents to ed with c/o dog bite to left upper leg. Pt sts he was at work ([employer]) and costumers dog attacked him. Per Pt dog's owner stated dog has been vaccinated.

## 2014-09-25 NOTE — ED Provider Notes (Signed)
Medical screening examination/treatment/procedure(s) were performed by non-physician practitioner and as supervising physician I was immediately available for consultation/collaboration.   Dot Lanes, MD 09/25/14 2118

## 2014-10-09 ENCOUNTER — Emergency Department (HOSPITAL_COMMUNITY): Payer: Self-pay

## 2014-10-09 ENCOUNTER — Other Ambulatory Visit: Payer: Self-pay

## 2014-10-09 ENCOUNTER — Telehealth: Payer: Self-pay | Admitting: Internal Medicine

## 2014-10-09 ENCOUNTER — Encounter (HOSPITAL_COMMUNITY): Payer: Self-pay

## 2014-10-09 ENCOUNTER — Emergency Department (HOSPITAL_COMMUNITY)
Admission: EM | Admit: 2014-10-09 | Discharge: 2014-10-09 | Disposition: A | Discharge status: Home / Self Care | Payer: Self-pay | Attending: Emergency Medicine | Admitting: Emergency Medicine

## 2014-10-09 DIAGNOSIS — F419 Anxiety disorder, unspecified: Secondary | ICD-10-CM | POA: Insufficient documentation

## 2014-10-09 DIAGNOSIS — R079 Chest pain, unspecified: Secondary | ICD-10-CM | POA: Insufficient documentation

## 2014-10-09 DIAGNOSIS — I4891 Unspecified atrial fibrillation: Secondary | ICD-10-CM | POA: Insufficient documentation

## 2014-10-09 DIAGNOSIS — Z792 Long term (current) use of antibiotics: Secondary | ICD-10-CM | POA: Insufficient documentation

## 2014-10-09 DIAGNOSIS — Z79899 Other long term (current) drug therapy: Secondary | ICD-10-CM | POA: Insufficient documentation

## 2014-10-09 DIAGNOSIS — Z87891 Personal history of nicotine dependence: Secondary | ICD-10-CM | POA: Insufficient documentation

## 2014-10-09 DIAGNOSIS — Z880 Allergy status to penicillin: Secondary | ICD-10-CM | POA: Insufficient documentation

## 2014-10-09 LAB — BASIC METABOLIC PANEL
Anion gap: 13 (ref 5–15)
BUN: 10 mg/dL (ref 6–23)
CO2: 27 mEq/L (ref 19–32)
Calcium: 9.5 mg/dL (ref 8.4–10.5)
Chloride: 98 mEq/L (ref 96–112)
Creatinine, Ser: 0.92 mg/dL (ref 0.50–1.35)
GFR calc Af Amer: >90 mL/min (ref >90)
GFR calc non Af Amer: >90 mL/min (ref >90)
GLUCOSE: 121 mg/dL — AB (ref 70–99)
POTASSIUM: 4.3 meq/L (ref 3.7–5.3)
SODIUM: 138 meq/L (ref 137–147)

## 2014-10-09 LAB — CBC WITH DIFFERENTIAL/PLATELET
Basophils Absolute: 0 10*3/uL (ref 0.0–0.1)
Basophils Relative: 0 % (ref 0–1)
Eosinophils Absolute: 0 10*3/uL (ref 0.0–0.7)
Eosinophils Relative: 0 % (ref 0–5)
HCT: 42 % (ref 39.0–52.0)
Hemoglobin: 14.2 g/dL (ref 13.0–17.0)
LYMPHS ABS: 1.6 10*3/uL (ref 0.7–4.0)
Lymphocytes Relative: 41 % (ref 12–46)
MCH: 29.6 pg (ref 26.0–34.0)
MCHC: 33.8 g/dL (ref 30.0–36.0)
MCV: 87.5 fL (ref 78.0–100.0)
MONOS PCT: 9 % (ref 3–12)
Monocytes Absolute: 0.4 10*3/uL (ref 0.1–1.0)
NEUTROS PCT: 50 % (ref 43–77)
Neutro Abs: 1.9 10*3/uL (ref 1.7–7.7)
PLATELETS: 266 10*3/uL (ref 150–400)
RBC: 4.8 MIL/uL (ref 4.22–5.81)
RDW: 11.9 % (ref 11.5–15.5)
WBC: 3.8 10*3/uL — ABNORMAL LOW (ref 4.0–10.5)

## 2014-10-09 LAB — I-STAT TROPONIN, ED: Troponin i, poc: 0 ng/mL (ref 0.00–0.08)

## 2014-10-09 LAB — D-DIMER, QUANTITATIVE: D-Dimer, Quant: <0.27 ug/mL-FEU (ref 0.00–0.48)

## 2014-10-09 MED ORDER — ACETAMINOPHEN 325 MG PO TABS
650.0000 mg | ORAL_TABLET | Freq: Once | ORAL | Status: AC
  Administered 2014-10-09: 650 mg via ORAL
  Filled 2014-10-09: qty 2

## 2014-10-09 NOTE — ED Provider Notes (Signed)
CSN: [id number]     Arrival date & time 10/09/14  1454 History   First MD Initiated Contact with Patient 10/09/14 1521     No chief complaint on file.    (Consider location/radiation/quality/duration/timing/severity/associated sxs/prior Treatment) HPI  Anthony Henderson is a 33 y.o. male Who states that he is having intermittent right-sided chest pain, sharp in nature, lasting just a few minutes, occurring without known cause, and resolving spontaneously for several days.no similar problem, in the past.  He has had a history of cardiac ablation, for SVT, this year.  He subsequently had a chronic stress test, which was normal.  He denies palpitations at this time.  There's been no dizziness, headache, back pain, weakness or dizziness.  He denies shortness of breath or cough.  He has had nausea without vomiting and denies diarrhea.  There are no other known modifying factors.   Past Medical History  Diagnosis Date  . SVT (supraventricular tachycardia)     s/p ablation 03-10-14 by Dr Lovena Le  . Depression   . High risk sexual behavior   . Other malaise and fatigue   . Anxiety     "related to SVT"  . Panic attack   . Atrial fibrillation     following RFCA of SVT 03/2014 - on Flecainide   Past Surgical History  Procedure Laterality Date  . Ablation  03/10/2014    RFCA of left posterior septal accessory pathyway by Dr Lovena Le with resultant atrial fibrillation   Family History  Problem Relation Age of Onset  . Diabetes      DM   History  Substance Use Topics  . Smoking status: Former Smoker -- 0.25 packs/day for 10 years    Types: Cigarettes    Quit date: 02/03/2014  . Smokeless tobacco: Never Used  . Alcohol Use: Yes     Comment: 03/10/2014 "might have a few drinks 1-2 times/yr, if that"    Review of Systems  All other systems reviewed and are negative.     Allergies  Penicillins  Home Medications   Prior to Admission medications   Medication Sig Start Date End Date Taking?  Authorizing Provider  pantoprazole (PROTONIX) 20 MG tablet Take 1 tablet (20 mg total) by mouth daily. 08/27/14  Yes Deepak Advani, MD  Pseudoeph-Doxylamine-DM-APAP (NYQUIL PO) Take 30 mLs by mouth daily as needed (for cough and congestion).   Yes Historical Provider, MD  clonazePAM (KLONOPIN) 1 MG tablet Take 1 mg by mouth daily as needed for anxiety.    Historical Provider, MD  flecainide (TAMBOCOR) 100 MG tablet Take 1 tablet (100 mg total) by mouth 2 (two) times daily. 03/11/14   Rhonda G Barrett, PA-C  metroNIDAZOLE (FLAGYL) 500 MG tablet Take 1 tablet (500 mg total) by mouth 2 (two) times daily. One po bid x 7 days 09/17/14   Noland Fordyce, PA-C   BP 103/61 mmHg  Pulse 65  Temp(Src) 98.2 F (36.8 C) (Oral)  Resp 16  Ht 6' (1.829 m)  Wt 140 lb (63.504 kg)  BMI 18.98 kg/m2  SpO2 100% Physical Exam  Constitutional: He is oriented to person, place, and time. He appears well-developed and well-nourished.  HENT:  Head: Normocephalic and atraumatic.  Right Ear: External ear normal.  Left Ear: External ear normal.  Eyes: Conjunctivae and EOM are normal. Pupils are equal, round, and reactive to light.  Neck: Normal range of motion and phonation normal. Neck supple.  Cardiovascular: Normal rate, regular rhythm and normal heart sounds.  Pulmonary/Chest: Effort normal and breath sounds normal. No respiratory distress. He has no wheezes. He exhibits no tenderness and no bony tenderness.  Abdominal: Soft. There is no tenderness.  Musculoskeletal: Normal range of motion.  Neurological: He is alert and oriented to person, place, and time. No cranial nerve deficit or sensory deficit. He exhibits normal muscle tone. Coordination normal.  Skin: Skin is warm, dry and intact.  Psychiatric: He has a normal mood and affect. His behavior is normal. Judgment and thought content normal.  Nursing note and vitals reviewed.   ED Course  Procedures (including critical care time)  Medications   acetaminophen (TYLENOL) tablet 650 mg (650 mg Oral Given 10/09/14 1748)    Patient Vitals for the past 24 hrs:  BP Temp Temp src Pulse Resp SpO2 Height Weight  10/09/14 1751 - 98.2 F (36.8 C) Oral - - - - -  10/09/14 1730 103/61 mmHg - - 65 16 100 % - -  10/09/14 1715 115/73 mmHg - - 73 14 100 % - -  10/09/14 1700 120/77 mmHg - - 64 14 100 % - -  10/09/14 1615 113/75 mmHg - - 69 14 100 % - -  10/09/14 1545 118/81 mmHg - - 68 16 100 % - -  10/09/14 1544 - - - - - 100 % - -  10/09/14 1534 135/92 mmHg - - 74 - 100 % - -  10/09/14 1500 123/83 mmHg 98.1 F (36.7 C) Oral 85 18 100 % 6' (1.829 m) 140 lb (63.504 kg)    At D/C Reevaluation with update and discussion. After initial assessment and treatment, an updated evaluation reveals he is comfortable. [place] Review Labs Reviewed  CBC WITH DIFFERENTIAL - Abnormal; Notable for the following:    WBC 3.8 (*)    All other components within normal limits  BASIC METABOLIC PANEL - Abnormal; Notable for the following:    Glucose, Bld 121 (*)    All other components within normal limits  D-DIMER, QUANTITATIVE  I-STAT TROPOININ, ED    Imaging Review Dg Chest 2 View  10/09/2014   CLINICAL DATA:  Right-sided chest pain. Dizziness. Nausea. Vomiting. Cough.  EXAM: CHEST  2 VIEW  COMPARISON:  02/03/2014  FINDINGS: The heart size and mediastinal contours are within normal limits. Both lungs are clear. The visualized skeletal structures are unremarkable.  IMPRESSION: Normal exam, unchanged.   Electronically Signed   By: Rozetta Nunnery M.D.   On: 10/09/2014 15:29     EKG Interpretation None         Date: 11/5/115- MUSE Hyperlink is inactive  Rate: 84  Rhythm: normal sinus rhythm  QRS Axis: normal  PR and QT Intervals: normal  ST/T Wave abnormalities: normal  PR and QRS Conduction Disutrbances:none  Narrative Interpretation:   Old EKG Reviewed: unchanged   MDM   Final diagnoses:  Nonspecific chest pain     Nonspecific chest pain, recent evaluation with stress test, and no coronary disease.  The pain is nonspecific and unlikely to represent an acute cardiac or pulmonary abnormality.   Nursing Notes Reviewed/ Care Coordinated Applicable Imaging Reviewed Interpretation of Laboratory Data incorporated into ED treatment  The patient appears reasonably screened and/or stabilized for discharge and I doubt any other medical condition or other [facility] requiring further screening, evaluation, or treatment in the ED at this time prior to discharge.  Plan: Home Medications- OTC analgesia; Home Treatments- rest; return here if the recommended treatment, does not improve  the symptoms; Recommended follow up- PCP prn    Richarda Blade, MD 10/09/14 2330

## 2014-10-09 NOTE — Telephone Encounter (Signed)
Operator states call from Wallace Going, I thought she said "[place]" and looked in epic and couldn't find him listed as a patient in [facility]. i asked him if he'd ever been here, and he said no. I then advised him to call his PCP.was seen in the hospital in April by Dr. Lovena Le.  States he is having intermittant, sharp chest pain. Then spoke with Clarene Critchley and found out he actually had a ETT in 03/2014 with Richardson Dopp. i tried calling him back, but had to leave voicemail message.

## 2014-10-09 NOTE — Telephone Encounter (Signed)
Routed to Dr Rayann Heman and Claiborne Billings.

## 2014-10-09 NOTE — ED Notes (Signed)
Pt presents with 2-3 day h/o R sided chest pain.  Pt reports pain is intermittent, resolves completely but returned today and has been constant.  +nausea and dizziness; denies any shortness of breath; reports ablation for SVT in April with stress test x 3 months later.

## 2014-10-09 NOTE — Discharge Instructions (Signed)
Use Tylenol, every 4 hours as needed for pain. Try using a heating pad on the sore area 2 or 3 times a day.    Chest Pain (Nonspecific) It is often hard to give a specific diagnosis for the cause of chest pain. There is always a chance that your pain could be related to something serious, such as a heart attack or a blood clot in the lungs. You need to follow up with your health care provider for further evaluation. CAUSES   Heartburn.  Pneumonia or bronchitis.  Anxiety or stress.  Inflammation around your heart (pericarditis) or lung (pleuritis or pleurisy).  A blood clot in the lung.  A collapsed lung (pneumothorax). It can develop suddenly on its own (spontaneous pneumothorax) or from trauma to the chest.  Shingles infection (herpes zoster virus). The chest wall is composed of bones, muscles, and cartilage. Any of these can be the source of the pain.  The bones can be bruised by injury.  The muscles or cartilage can be strained by coughing or overwork.  The cartilage can be affected by inflammation and become sore (costochondritis). DIAGNOSIS  Lab tests or other studies may be needed to find the cause of your pain. Your health care provider may have you take a test called an ambulatory electrocardiogram (ECG). An ECG records your heartbeat patterns over a 24-hour period. You may also have other tests, such as:  Transthoracic echocardiogram (TTE). During echocardiography, sound waves are used to evaluate how blood flows through your heart.  Transesophageal echocardiogram (TEE).  Cardiac monitoring. This allows your health care provider to monitor your heart rate and rhythm in real time.  Holter monitor. This is a portable device that records your heartbeat and can help diagnose heart arrhythmias. It allows your health care provider to track your heart activity for several days, if needed.  Stress tests by exercise or by giving medicine that makes the heart beat  faster. TREATMENT   Treatment depends on what may be causing your chest pain. Treatment may include:  Acid blockers for heartburn.  Anti-inflammatory medicine.  Pain medicine for inflammatory conditions.  Antibiotics if an infection is present.  You may be advised to change lifestyle habits. This includes stopping smoking and avoiding alcohol, caffeine, and chocolate.  You may be advised to keep your head raised (elevated) when sleeping. This reduces the chance of acid going backward from your stomach into your esophagus. Most of the time, nonspecific chest pain will improve within 2-3 days with rest and mild pain medicine.  HOME CARE INSTRUCTIONS   If antibiotics were prescribed, take them as directed. Finish them even if you start to feel better.  For the next few days, avoid physical activities that bring on chest pain. Continue physical activities as directed.  Do not use any tobacco products, including cigarettes, chewing tobacco, or electronic cigarettes.  Avoid drinking alcohol.  Only take medicine as directed by your health care provider.  Follow your health care provider's suggestions for further testing if your chest pain does not go away.  Keep any follow-up appointments you made. If you do not go to an appointment, you could develop lasting (chronic) problems with pain. If there is any problem keeping an appointment, call to reschedule. SEEK MEDICAL CARE IF:   Your chest pain does not go away, even after treatment.  You have a rash with blisters on your chest.  You have a fever. SEEK IMMEDIATE MEDICAL CARE IF:   You have increased chest pain  or pain that spreads to your arm, neck, jaw, back, or abdomen.  You have shortness of breath.  You have an increasing cough, or you cough up blood.  You have severe back or abdominal pain.  You feel nauseous or vomit.  You have severe weakness.  You faint.  You have chills. This is an emergency. Do not wait to  see if the pain will go away. Get medical help at once. Call your local emergency services (911 in [place] Do not drive yourself to the hospital. MAKE SURE YOU:   Understand these instructions.  Will watch your condition.  Will get help right away if you are not doing well or get worse. Document Released: 08/31/2005 Document Revised: 11/26/2013 Document Reviewed: 06/26/2008 [facility] Patient Information 2015 [place], [place]. This information is not intended to replace advice given to you by your health care provider. Make sure you discuss any questions you have with your health care provider.

## 2014-10-09 NOTE — Telephone Encounter (Signed)
New Message        Pt calling stating he is having sharp pains in chest.

## 2014-10-09 NOTE — Telephone Encounter (Signed)
Left him a message that I do not know what the sensation is that he is describing but that I would follow up with his PCP first and if they feel he needs to be seen by [facility] to call the office and will have him seen

## 2014-10-09 NOTE — Telephone Encounter (Signed)
Spoke with patient again. He describes feeling an odd sensation, like a twitching on right side of chest. Denies chest pain. No sob or dyspnea, but is concerned with this feeling. Will route to Dr. Lovena Le and Claiborne Billings, [place]

## 2014-10-09 NOTE — Telephone Encounter (Signed)
F/u   Pt returning a call from triage nurse Vaughan Basta.

## 2014-10-14 ENCOUNTER — Encounter: Payer: Self-pay | Admitting: Internal Medicine

## 2014-10-14 ENCOUNTER — Ambulatory Visit: Payer: Self-pay | Attending: Internal Medicine | Admitting: Internal Medicine

## 2014-10-14 VITALS — BP 118/82 | HR 91 | Temp 98.0°F | Resp 16 | Wt 137.4 lb

## 2014-10-14 DIAGNOSIS — I4891 Unspecified atrial fibrillation: Secondary | ICD-10-CM | POA: Insufficient documentation

## 2014-10-14 DIAGNOSIS — Z79899 Other long term (current) drug therapy: Secondary | ICD-10-CM | POA: Insufficient documentation

## 2014-10-14 DIAGNOSIS — R1013 Epigastric pain: Secondary | ICD-10-CM | POA: Insufficient documentation

## 2014-10-14 DIAGNOSIS — Z87891 Personal history of nicotine dependence: Secondary | ICD-10-CM | POA: Insufficient documentation

## 2014-10-14 DIAGNOSIS — F410 Panic disorder [episodic paroxysmal anxiety] without agoraphobia: Secondary | ICD-10-CM | POA: Insufficient documentation

## 2014-10-14 DIAGNOSIS — I471 Supraventricular tachycardia: Secondary | ICD-10-CM | POA: Insufficient documentation

## 2014-10-14 DIAGNOSIS — Z7251 High risk heterosexual behavior: Secondary | ICD-10-CM | POA: Insufficient documentation

## 2014-10-14 DIAGNOSIS — K219 Gastro-esophageal reflux disease without esophagitis: Secondary | ICD-10-CM | POA: Insufficient documentation

## 2014-10-14 DIAGNOSIS — R110 Nausea: Secondary | ICD-10-CM | POA: Insufficient documentation

## 2014-10-14 DIAGNOSIS — F064 Anxiety disorder due to known physiological condition: Secondary | ICD-10-CM | POA: Insufficient documentation

## 2014-10-14 MED ORDER — PANTOPRAZOLE SODIUM 40 MG PO TBEC: 40.0000 mg | DELAYED_RELEASE_TABLET | Freq: Every day | ORAL | Status: DC

## 2014-10-14 MED ORDER — ALUMINUM-MAGNESIUM-SIMETHICONE 200-200-20 MG/5ML PO SUSP: 30.0000 mL | Freq: Three times a day (TID) | ORAL | Status: DC

## 2014-10-14 MED ORDER — METOCLOPRAMIDE HCL 10 MG PO TABS: 10.0000 mg | ORAL_TABLET | Freq: Three times a day (TID) | ORAL | Status: DC | PRN

## 2014-10-14 NOTE — Progress Notes (Signed)
MRN: [id number] Name: DICKSON KOSTELNIK  Sex: male Age: 33 y.o. DOB: [birth year]  Allergies: Penicillins  Chief Complaint  Patient presents with  . upset stomach    HPI: Patient is 33 y.o. male who complaints of reflux symptoms dyspepsia bloating and nausea, patient has been taking Protonix 20 mg daily, denies any change in bowel habits, also history of chest pain recently went to the emergency room, denies any chest pain currently. Patient denies any headache dizziness.patient denies any fever chills denies smoking cigarettes.patient is requesting referral to see a gastroenterologist.  Past Medical History  Diagnosis Date  . SVT (supraventricular tachycardia)     s/p ablation 03-10-14 by Dr Lovena Le  . Depression   . High risk sexual behavior   . Other malaise and fatigue   . Anxiety     "related to SVT"  . Panic attack   . Atrial fibrillation     following RFCA of SVT 03/2014 - on Flecainide    Past Surgical History  Procedure Laterality Date  . Ablation  03/10/2014    RFCA of left posterior septal accessory pathyway by Dr Lovena Le with resultant atrial fibrillation      Medication List       This list is accurate as of: 10/14/14  5:17 PM.  Always use your most recent med list.               aluminum-magnesium hydroxide-simethicone 200-200-20 MG/5ML Susp  Commonly known as:  MAALOX  Take 30 mLs by mouth 4 (four) times daily -  before meals and at bedtime.     clonazePAM 1 MG tablet  Commonly known as:  KLONOPIN  Take 1 mg by mouth daily as needed for anxiety.     flecainide 100 MG tablet  Commonly known as:  TAMBOCOR  Take 1 tablet (100 mg total) by mouth 2 (two) times daily.     metoCLOPramide 10 MG tablet  Commonly known as:  REGLAN  Take 1 tablet (10 mg total) by mouth every 8 (eight) hours as needed for nausea or vomiting.     metroNIDAZOLE 500 MG tablet  Commonly known as:  FLAGYL  Take 1 tablet (500 mg total) by mouth 2 (two) times daily. One po bid x  7 days     NYQUIL PO  Take 30 mLs by mouth daily as needed (for cough and congestion).     pantoprazole 40 MG tablet  Commonly known as:  PROTONIX  Take 1 tablet (40 mg total) by mouth daily.        Meds ordered this encounter  Medications  . pantoprazole (PROTONIX) 40 MG tablet    Sig: Take 1 tablet (40 mg total) by mouth daily.    Dispense:  30 tablet    Refill:  2  . metoCLOPramide (REGLAN) 10 MG tablet    Sig: Take 1 tablet (10 mg total) by mouth every 8 (eight) hours as needed for nausea or vomiting.    Dispense:  60 tablet    Refill:  1  . aluminum-magnesium hydroxide-simethicone (MAALOX) 195-093-26 MG/5ML SUSP    Sig: Take 30 mLs by mouth 4 (four) times daily -  before meals and at bedtime.    Dispense:  1120 mL    Refill:  0    Immunization History  Administered Date(s) Administered  . Tdap 07/01/2013    Family History  Problem Relation Age of Onset  . Diabetes      DM  History  Substance Use Topics  . Smoking status: Former Smoker -- 0.25 packs/day for 10 years    Types: Cigarettes    Quit date: 02/03/2014  . Smokeless tobacco: Never Used  . Alcohol Use: Yes     Comment: 03/10/2014 "might have a few drinks 1-2 times/yr, if that"    Review of Systems   As noted in HPI  Filed Vitals:   10/14/14 1704  BP: 118/82  Pulse: 91  Temp: 98 F (36.7 C)  Resp: 16    Physical Exam  Physical Exam  Constitutional: No distress.  Eyes: EOM are normal. Pupils are equal, round, and reactive to light.  Cardiovascular: Normal rate and regular rhythm.   Pulmonary/Chest: Breath sounds normal. No respiratory distress. He has no wheezes. He has no rales.  Abdominal: Soft. He exhibits no distension. There is no tenderness. There is no rebound.    CBC    Component Value Date/Time   WBC 3.8* 10/09/2014 1504   RBC 4.80 10/09/2014 1504   HGB 14.2 10/09/2014 1504   HCT 42.0 10/09/2014 1504   PLT 266 10/09/2014 1504   MCV 87.5 10/09/2014 1504   LYMPHSABS 1.6  10/09/2014 1504   MONOABS 0.4 10/09/2014 1504   EOSABS 0.0 10/09/2014 1504   BASOSABS 0.0 10/09/2014 1504    CMP     Component Value Date/Time   NA 138 10/09/2014 1504   K 4.3 10/09/2014 1504   CL 98 10/09/2014 1504   CO2 27 10/09/2014 1504   GLUCOSE 121* 10/09/2014 1504   BUN 10 10/09/2014 1504   CREATININE 0.92 10/09/2014 1504   CREATININE 1.06 05/02/2013 1219   CALCIUM 9.5 10/09/2014 1504   PROT 7.3 12/23/2013 1304   ALBUMIN 4.0 12/23/2013 1304   AST 23 12/23/2013 1304   ALT 13 12/23/2013 1304   ALKPHOS 47 12/23/2013 1304   BILITOT 0.4 12/23/2013 1304   GFRNONAA >90 10/09/2014 1504   GFRAA >90 10/09/2014 1504    No results found for: CHOL  No components found for: HGA1C  Lab Results  Component Value Date/Time   AST 23 12/23/2013 01:04 PM    Assessment and Plan  Gastroesophageal reflux disease, esophagitis presence not specified - Plan:I have advised patient for lifestyle modification, increased the dose of Protonix pantoprazole (PROTONIX) 40 MG tablet, Ambulatory referral to Gastroenterology  Nausea - Plan: metoCLOPramide (REGLAN) 10 MG tablet when necessary for nausea  Dyspepsia - Plan: aluminum-magnesium hydroxide-simethicone (MAALOX) 500-938-18 MG/5ML SUSP, Ambulatory referral to Gastroenterology    Return in about 3 months (around 01/14/2015), or if symptoms worsen or fail to improve, for gerd.  Lorayne Marek, MD

## 2014-10-14 NOTE — Progress Notes (Signed)
Patient states was seen in the ED last week for chest pains Today complains of upset stomach not able to hold all his meals down States the protonix is not working and would like a referral to the GI doctor

## 2014-10-20 ENCOUNTER — Other Ambulatory Visit (INDEPENDENT_AMBULATORY_CARE_PROVIDER_SITE_OTHER): Payer: Worker's Compensation

## 2014-10-20 ENCOUNTER — Ambulatory Visit (INDEPENDENT_AMBULATORY_CARE_PROVIDER_SITE_OTHER): Payer: Self-pay | Admitting: Gastroenterology

## 2014-10-20 ENCOUNTER — Encounter: Payer: Self-pay | Admitting: Gastroenterology

## 2014-10-20 VITALS — BP 104/78 | HR 84 | Ht 71.5 in | Wt 134.5 lb

## 2014-10-20 DIAGNOSIS — R109 Unspecified abdominal pain: Secondary | ICD-10-CM

## 2014-10-20 DIAGNOSIS — R112 Nausea with vomiting, unspecified: Secondary | ICD-10-CM

## 2014-10-20 LAB — LIPASE: Lipase: 24 U/L (ref 11.0–59.0)

## 2014-10-20 LAB — HEPATIC FUNCTION PANEL
ALT: 13 U/L (ref 0–53)
AST: 21 U/L (ref 0–37)
Albumin: 4.7 g/dL (ref 3.5–5.2)
Alkaline Phosphatase: 47 U/L (ref 39–117)
BILIRUBIN TOTAL: 0.8 mg/dL (ref 0.2–1.2)
Bilirubin, Direct: 0.1 mg/dL (ref 0.0–0.3)
Total Protein: 7.9 g/dL (ref 6.0–8.3)

## 2014-10-20 MED ORDER — ONDANSETRON 4 MG PO TBDP: 4.0000 mg | ORAL_TABLET | Freq: Three times a day (TID) | ORAL | Status: DC | PRN

## 2014-10-20 NOTE — Patient Instructions (Signed)
Your physician has requested that you go to the basement for the following lab work before leaving today: Hepatic Function Panel  Lipase  We have sent the following medications to your pharmacy for you to pick up at your convenience: Zofran 4 mg ____________________________________________________________________________________________________________________________________________________________________________________________________ Anthony Henderson have been scheduled for an abdominal ultrasound at [facility]iology (1st floor of hospital) on 11-03-2014 at 9 am. Please arrive 15 minutes prior to your appointment for registration. Make certain not to have anything to eat or drink after midnight. Should you need to reschedule your appointment, please contact radiology at [phone]. This test typically takes about 30 minutes to perform.  You have been scheduled for a HIDA scan at [facility]iology (1st floor) on 11-03-2014. Please arrive 15 minutes prior to your scheduled appointment at  6:38 am. Make certain not to have anything to eat or drink after midnight . Should this appointment date or time not work well for you, please call radiology scheduling at [phone].  _____________________________________________________________________ hepatobiliary (HIDA) scan is an imaging procedure used to diagnose problems in the liver, gallbladder and bile ducts. In the HIDA scan, a radioactive chemical or tracer is injected into a vein in your arm. The tracer is handled by the liver like bile. Bile is a fluid produced and excreted by your liver that helps your digestive system break down fats in the foods you eat. Bile is stored in your gallbladder and the gallbladder releases the bile when you eat a meal. A special nuclear medicine scanner (gamma camera) tracks the flow of the tracer from your liver into your gallbladder and small intestine.  During your HIDA scan  You'll be asked to change into a hospital  gown before your HIDA scan begins. Your health care team will position you on a table, usually on your back. The radioactive tracer is then injected into a vein in your arm.The tracer travels through your bloodstream to your liver, where it's taken up by the bile-producing cells. The radioactive tracer travels with the bile from your liver into your gallbladder and through your bile ducts to your small intestine.You may feel some pressure while the radioactive tracer is injected into your vein. As you lie on the table, a special gamma camera is positioned over your abdomen taking pictures of the tracer as it moves through your body. The gamma camera takes pictures continually for about an hour. You'll need to keep still during the HIDA scan. This can become uncomfortable, but you may find that you can lessen the discomfort by taking deep breaths and thinking about other things. Tell your health care team if you're uncomfortable. The radiologist will watch on a computer the progress of the radioactive tracer through your body. The HIDA scan may be stopped when the radioactive tracer is seen in the gallbladder and enters your small intestine. This typically takes about an hour. In some cases extra imaging will be performed if original images aren't satisfactory, if morphine is given to help visualize the gallbladder or if the medication CCK is given to look at the contraction of the gallbladder. This test typically takes 2 hours to complete. ________________________________________________________________________

## 2014-10-21 ENCOUNTER — Encounter: Payer: Self-pay | Admitting: Gastroenterology

## 2014-10-21 DIAGNOSIS — R110 Nausea: Secondary | ICD-10-CM | POA: Insufficient documentation

## 2014-10-21 DIAGNOSIS — R109 Unspecified abdominal pain: Secondary | ICD-10-CM | POA: Insufficient documentation

## 2014-10-21 NOTE — Progress Notes (Addendum)
10/21/2014 Anthony Henderson [id number] 12-20-1980   HISTORY OF PRESENT ILLNESS:  This is a 33 year old male who has PMH of SVT s/p ablation in 03/2014.  He presents to our office today as a new patient with complaints of upper abdominal pain and vomiting after eating.  These symptoms have been present for about 6 months.  Is sometimes able to eat big meals without problems but other times he vomits after eating only smaller amounts.  Vomits almost after every other meal about 30-60 minutes after eating.  Says that he gets nauseated "out of the blue" throughout the day.  Denies heartburn/reflux/indigestion.  Pain is in upper abdomen and goes to the right side at times.  Has been on pantoprazole 40 mg daily for about one month without improvement in symptoms.  Was given Reglan to take prn for nausea, but he has only taken it a couple of times.  Denies NSAID use.  Denies weight loss.  CBC and BMP unremarkable recently.   Past Medical History  Diagnosis Date  . SVT (supraventricular tachycardia)     s/p ablation 03-10-14 by Dr Lovena Le  . Depression   . High risk sexual behavior   . Other malaise and fatigue   . Anxiety     "related to SVT"  . Panic attack   . Atrial fibrillation     following RFCA of SVT 03/2014 - on Flecainide   Past Surgical History  Procedure Laterality Date  . Ablation  03/10/2014    RFCA of left posterior septal accessory pathyway by Dr Lovena Le with resultant atrial fibrillation    reports that he quit smoking about 8 months ago. His smoking use included Cigarettes. He has a 2.5 pack-year smoking history. He has never used smokeless tobacco. He reports that he drinks alcohol. He reports that he does not use illicit drugs. family history includes Diabetes in his maternal grandmother. Allergies  Allergen Reactions  . Penicillins Hives      Outpatient Encounter Prescriptions as of 10/20/2014  Medication Sig  . metoCLOPramide (REGLAN) 10 MG tablet Take 1 tablet (10  mg total) by mouth every 8 (eight) hours as needed for nausea or vomiting.  . pantoprazole (PROTONIX) 40 MG tablet Take 1 tablet (40 mg total) by mouth daily.  . ondansetron (ZOFRAN ODT) 4 MG disintegrating tablet Take 1 tablet (4 mg total) by mouth every 8 (eight) hours as needed for nausea or vomiting.  . [DISCONTINUED] aluminum-magnesium hydroxide-simethicone (MAALOX) 235-361-44 MG/5ML SUSP Take 30 mLs by mouth 4 (four) times daily -  before meals and at bedtime.  . [DISCONTINUED] clonazePAM (KLONOPIN) 1 MG tablet Take 1 mg by mouth daily as needed for anxiety.  . [DISCONTINUED] flecainide (TAMBOCOR) 100 MG tablet Take 1 tablet (100 mg total) by mouth 2 (two) times daily.  . [DISCONTINUED] metroNIDAZOLE (FLAGYL) 500 MG tablet Take 1 tablet (500 mg total) by mouth 2 (two) times daily. One po bid x 7 days  . [DISCONTINUED] Pseudoeph-Doxylamine-DM-APAP (NYQUIL PO) Take 30 mLs by mouth daily as needed (for cough and congestion).     REVIEW OF SYSTEMS  : All other systems reviewed and negative except where noted in the History of Present Illness.   PHYSICAL EXAM: BP 104/78 mmHg  Pulse 84  Ht 5' 11.5" (1.816 m)  Wt 134 lb 8 oz (61.009 kg)  BMI 18.50 kg/m2 General: Well developed black male in no acute distress Head: Normocephalic and atraumatic Eyes:  Sclerae anicteric, conjunctiva pink. Ears:  Normal auditory acuity Lungs: Clear throughout to auscultation Heart: Regular rate and rhythm Abdomen: Soft, non-distended.  Normal bowel sounds.  Mild epigastric/RUQ TTP without R/R/G. Musculoskeletal: Symmetrical with no gross deformities  Skin: No lesions on visible extremities Extremities: No edema  Neurological: Alert oriented x 4, grossly non-focal Psychological:  Alert and cooperative. Normal mood and affect  ASSESSMENT AND PLAN: -Nausea with vomiting and upper abdominal pain:  Differential includes gallbladder disease, GERD/ulcer disease but denies pyrosis and reflux sensation/no response  to one month of PPI, and gastroparesis:  Will pursue gallbladder evaluation first.  Will check labs including lipase and hepatic function panel (recent CBC and BMP unremarkable).  Will order ultrasound of the abdomen and if negative then proceed with HIDA scan.  Will try prn Zofran instead of the Reglan that he was given.  Continue pantoprazole 40 mg daily for now.  If this evaluation is negative then will likely need EGD as next step.  Addendum: Reviewed and agree with initial management. Jerene Bears, MD

## 2014-10-29 ENCOUNTER — Emergency Department (HOSPITAL_COMMUNITY)
Admission: EM | Admit: 2014-10-29 | Discharge: 2014-10-29 | Disposition: A | Discharge status: Home / Self Care | Payer: Self-pay | Attending: Emergency Medicine | Admitting: Emergency Medicine

## 2014-10-29 ENCOUNTER — Emergency Department (HOSPITAL_COMMUNITY): Payer: Self-pay

## 2014-10-29 ENCOUNTER — Encounter (HOSPITAL_COMMUNITY): Payer: Self-pay | Admitting: Emergency Medicine

## 2014-10-29 DIAGNOSIS — Z880 Allergy status to penicillin: Secondary | ICD-10-CM | POA: Insufficient documentation

## 2014-10-29 DIAGNOSIS — R042 Hemoptysis: Secondary | ICD-10-CM | POA: Insufficient documentation

## 2014-10-29 DIAGNOSIS — Z8679 Personal history of other diseases of the circulatory system: Secondary | ICD-10-CM | POA: Insufficient documentation

## 2014-10-29 DIAGNOSIS — Z79899 Other long term (current) drug therapy: Secondary | ICD-10-CM | POA: Insufficient documentation

## 2014-10-29 DIAGNOSIS — R1010 Upper abdominal pain, unspecified: Secondary | ICD-10-CM | POA: Insufficient documentation

## 2014-10-29 DIAGNOSIS — Z87891 Personal history of nicotine dependence: Secondary | ICD-10-CM | POA: Insufficient documentation

## 2014-10-29 DIAGNOSIS — Z8659 Personal history of other mental and behavioral disorders: Secondary | ICD-10-CM | POA: Insufficient documentation

## 2014-10-29 LAB — CBC WITH DIFFERENTIAL/PLATELET
BASOS ABS: 0 10*3/uL (ref 0.0–0.1)
Basophils Relative: 0 % (ref 0–1)
Eosinophils Absolute: 0 10*3/uL (ref 0.0–0.7)
Eosinophils Relative: 1 % (ref 0–5)
HCT: 43 % (ref 39.0–52.0)
Hemoglobin: 14.6 g/dL (ref 13.0–17.0)
LYMPHS ABS: 2.1 10*3/uL (ref 0.7–4.0)
LYMPHS PCT: 43 % (ref 12–46)
MCH: 30.4 pg (ref 26.0–34.0)
MCHC: 34 g/dL (ref 30.0–36.0)
MCV: 89.4 fL (ref 78.0–100.0)
Monocytes Absolute: 0.4 10*3/uL (ref 0.1–1.0)
Monocytes Relative: 8 % (ref 3–12)
NEUTROS PCT: 48 % (ref 43–77)
Neutro Abs: 2.2 10*3/uL (ref 1.7–7.7)
Platelets: 266 10*3/uL (ref 150–400)
RBC: 4.81 MIL/uL (ref 4.22–5.81)
RDW: 11.8 % (ref 11.5–15.5)
WBC: 4.7 10*3/uL (ref 4.0–10.5)

## 2014-10-29 LAB — COMPREHENSIVE METABOLIC PANEL
ALK PHOS: 59 U/L (ref 39–117)
ALT: 16 U/L (ref 0–53)
AST: 27 U/L (ref 0–37)
Albumin: 4.6 g/dL (ref 3.5–5.2)
Anion gap: 12 (ref 5–15)
BILIRUBIN TOTAL: 0.4 mg/dL (ref 0.3–1.2)
BUN: 9 mg/dL (ref 6–23)
CALCIUM: 9.8 mg/dL (ref 8.4–10.5)
CHLORIDE: 95 meq/L — AB (ref 96–112)
CO2: 27 mEq/L (ref 19–32)
Creatinine, Ser: 0.97 mg/dL (ref 0.50–1.35)
GFR calc Af Amer: >90 mL/min (ref >90)
GFR calc non Af Amer: >90 mL/min (ref >90)
GLUCOSE: 111 mg/dL — AB (ref 70–99)
POTASSIUM: 4 meq/L (ref 3.7–5.3)
SODIUM: 134 meq/L — AB (ref 137–147)
Total Protein: 7.8 g/dL (ref 6.0–8.3)

## 2014-10-29 LAB — URINALYSIS, ROUTINE W REFLEX MICROSCOPIC
Bilirubin Urine: NEGATIVE
Glucose, UA: NEGATIVE mg/dL
HGB URINE DIPSTICK: NEGATIVE
Ketones, ur: NEGATIVE mg/dL
LEUKOCYTES UA: NEGATIVE
Nitrite: NEGATIVE
PH: 7.5 (ref 5.0–8.0)
Protein, ur: NEGATIVE mg/dL
Specific Gravity, Urine: 1.01 (ref 1.005–1.030)
Urobilinogen, UA: 1 mg/dL (ref 0.0–1.0)

## 2014-10-29 LAB — POC OCCULT BLOOD, ED: FECAL OCCULT BLD: NEGATIVE

## 2014-10-29 LAB — D-DIMER, QUANTITATIVE (NOT AT ARMC)

## 2014-10-29 LAB — LIPASE, BLOOD: Lipase: 22 U/L (ref 11–59)

## 2014-10-29 NOTE — ED Notes (Signed)
Pt states that he has been vomiting x "a few months".  Seeing a GI doc.  States sometimes after he vomits he will cough up a little bit of bloood.  States that there are streaks of blood in his sputum.  Denies large amounts of blood.  It has not happened in 2 days.  Denies weight loss.  Denies night sweats.

## 2014-10-29 NOTE — Discharge Instructions (Signed)
Hemoptysis  Hemoptysis, which means coughing up blood, can be a sign of a minor problem or a serious medical condition. The blood that is coughed up may come from the lungs and airways. Coughed-up blood can also come from bleeding that occurs outside the lungs and airways. Blood can drain into the windpipe during a severe nosebleed or when blood is vomited from the stomach. Because hemoptysis can be a sign of something serious, a medical evaluation is required. For some people with hemoptysis, no definite cause is ever identified.  CAUSES   The most common cause of hemoptysis is bronchitis. Some other common causes include:    A ruptured blood vessel caused by coughing or an infection.    A medical condition that causes damage to the large air passageways (bronchiectasis).    A blood clot in the lungs (pulmonary embolism).    Pneumonia.    Tuberculosis.    Breathing in a small foreign object.    Cancer.  For some people with hemoptysis, no definite cause is ever identified.   HOME CARE INSTRUCTIONS   Only take over-the-counter or prescription medicines as directed by your caregiver. Do not use cough suppressants unless your caregiver approves.   If your caregiver prescribes antibiotic medicines, take them as directed. Finish them even if you start to feel better.   Do not smoke. Also avoid secondhand smoke.   Follow up with your caregiver as directed.  SEEK IMMEDIATE MEDICAL CARE IF:    You cough up bloody mucus for longer than a week.   You have a blood-producing cough that is severe or getting worse.   You have a blood-producing cough thatcomes and goes over time.   You develop problems with your breathing.    You vomit blood.   You develop bloody or black-colored stools.   You have chest pain.    You develop night sweats.   You feel faint or pass out.    You have a fever or persistent symptoms for more than 2-3 days.   You have a fever and your symptoms suddenly get worse.  MAKE  SURE YOU:   Understand these instructions.   Will watch your condition.   Will get help right away if you are not doing well or get worse.  Document Released: 01/30/2002 Document Revised: 11/07/2012 Document Reviewed: 09/07/2012  Exit[employer] Patient Information 2015 [employer]. This information is not intended to replace advice given to you by your health care provider. Make sure you discuss any questions you have with your health care provider.

## 2014-10-29 NOTE — ED Provider Notes (Signed)
CSN: [id number]     Arrival date & time 10/29/14  1824 History   First MD Initiated Contact with Patient 10/29/14 1926     Chief Complaint  Patient presents with  . Hemoptysis  . Nausea  . Emesis     (Consider location/radiation/quality/duration/timing/severity/associated sxs/prior Treatment) HPI   33 year old male with history of A. Fib, SVT status post ablation, and depression who presents for evaluation of hemoptysis. Patient states a few hours ago he spits some sputum with trace of blood in it and it concerns him. He reports blood with small in amount. Denies having the symptoms in the past. He is unsure if he's coughing up vomiting it up but states that he has been having intermittent upper abdominal pain with nausea and vomiting has been ongoing for the past few months. Patient states he has been evaluated recently and now is scheduled to have a upper abdominal ultrasound and a hiatus scan to evaluate for gallbladder problem. At this time he denies having any significant abdominal pain nausea or vomiting. He denies fever, headache, lightheadedness, dizziness, back pain, numbness, weakness. He denies any abnormal weight changes or night sweats. He denies any history of alcohol abuse, or taken NSAIDs on a regular basis. No prior history of PE or DVT, no recent surgery, prolonged bed rest, unilateral leg swelling or calf pain, or active cancer. At this time he doesn't have any significant complaints. Reported he had heart ablation this past April, currently not on any blood thinner medication. He denies any blood in the stools, black tarry stool or any other abnormal bleeding.  Past Medical History  Diagnosis Date  . SVT (supraventricular tachycardia)     s/p ablation 03-10-14 by Dr Lovena Le  . Depression   . High risk sexual behavior   . Other malaise and fatigue   . Anxiety     "related to SVT"  . Panic attack   . Atrial fibrillation     following RFCA of SVT 03/2014 - on Flecainide    Past Surgical History  Procedure Laterality Date  . Ablation  03/10/2014    RFCA of left posterior septal accessory pathyway by Dr Lovena Le with resultant atrial fibrillation   Family History  Problem Relation Age of Onset  . Diabetes Maternal Grandmother    History  Substance Use Topics  . Smoking status: Former Smoker -- 0.25 packs/day for 10 years    Types: Cigarettes    Quit date: 02/03/2014  . Smokeless tobacco: Never Used  . Alcohol Use: Yes     Comment: 03/10/2014 "might have a few drinks 1-2 times/yr, if that"    Review of Systems  All other systems reviewed and are negative.     Allergies  Penicillins  Home Medications   Prior to Admission medications   Medication Sig Start Date End Date Taking? Authorizing Provider  metoCLOPramide (REGLAN) 10 MG tablet Take 1 tablet (10 mg total) by mouth every 8 (eight) hours as needed for nausea or vomiting. 10/14/14   Lorayne Marek, MD  ondansetron (ZOFRAN ODT) 4 MG disintegrating tablet Take 1 tablet (4 mg total) by mouth every 8 (eight) hours as needed for nausea or vomiting. 10/20/14   Laban Emperor. Zehr, PA-C  pantoprazole (PROTONIX) 40 MG tablet Take 1 tablet (40 mg total) by mouth daily. 10/14/14   Lorayne Marek, MD   BP 119/77 mmHg  Pulse 85  Temp(Src) 98.7 F (37.1 C) (Oral)  Resp 18  SpO2 100% Physical Exam  Constitutional: He is  oriented to person, place, and time. He appears well-developed and well-nourished. No distress.  HENT:  Head: Atraumatic.  Mouth/Throat: Oropharynx is clear and moist.  Eyes: Conjunctivae are normal.  Neck: Normal range of motion. Neck supple.  Cardiovascular: Normal rate, regular rhythm and intact distal pulses.  Exam reveals no gallop and no friction rub.   No murmur heard. Pulmonary/Chest: Effort normal and breath sounds normal. No respiratory distress. He has no wheezes. He has no rales. He exhibits no tenderness.  Abdominal: Soft. There is no tenderness.  Musculoskeletal: Normal  range of motion. He exhibits no edema.  Neurological: He is alert and oriented to person, place, and time.  Skin: No rash noted.  Psychiatric: He has a normal mood and affect.    ED Course  Procedures (including critical care time)  8:41 PM Patient is here with a single episode of hemoptysis without any significant blood loss. No heavy coughing or retching to suggest Mallory Weiss tear.  Patient denies any shortness of breath or dyspnea on exertion however giving the complaints of hemoptysis and a prior history of atrial fibrillation a d-dimer was ordered to rule out PE. Otherwise patient is afebrile with stable normal vital signs.  8:43 PM Patient has a negative d-dimer, normal UA, normal H&H, no evidence of anemia, and electrolytes pain also reassuring. His chest x-ray is without any acute abnormalities. Reassurance given, will have patient follow-up with his PCP for further management.  Labs Review Labs Reviewed  COMPREHENSIVE METABOLIC PANEL - Abnormal; Notable for the following:    Sodium 134 (*)    Chloride 95 (*)    Glucose, Bld 111 (*)    All other components within normal limits  CBC WITH DIFFERENTIAL  LIPASE, BLOOD  URINALYSIS, ROUTINE W REFLEX MICROSCOPIC  D-DIMER, QUANTITATIVE  I-STAT CHEM 8, ED  POC OCCULT BLOOD, ED    Imaging Review Dg Chest 2 View  10/29/2014   CLINICAL DATA:  Spitting up blood.  EXAM: CHEST  2 VIEW  COMPARISON:  10/09/2014  FINDINGS: The heart size and mediastinal contours are within normal limits. Both lungs are clear. The visualized skeletal structures are unremarkable.  IMPRESSION: No active cardiopulmonary disease.   Electronically Signed   By: Rolm Baptise M.D.   On: 10/29/2014 20:19     EKG Interpretation None      Date: 10/29/2014  Rate: 69  Rhythm: normal sinus rhythm  QRS Axis: normal  Intervals: normal  ST/T Wave abnormalities: nonspecific ST changes  Conduction Disutrbances:none  Narrative Interpretation: anteroseptal infarct,  old  Old EKG Reviewed: none available    MDM   Final diagnoses:  Hemoptysis    BP 119/77 mmHg  Pulse 85  Temp(Src) 98.7 F (37.1 C) (Oral)  Resp 18  SpO2 100%  I have reviewed nursing notes and vital signs. I personally reviewed the imaging tests through PACS system  I reviewed available ER/hospitalization records thought the EMR     Domenic Moras, PA-C 10/29/14 2109  Malvin Johns, MD 10/29/14 2257

## 2014-11-03 ENCOUNTER — Other Ambulatory Visit: Payer: Self-pay | Admitting: *Deleted

## 2014-11-03 ENCOUNTER — Ambulatory Visit (HOSPITAL_COMMUNITY)
Admission: RE | Admit: 2014-11-03 | Discharge: 2014-11-03 | Disposition: A | Discharge status: Home / Self Care | Payer: Self-pay | Source: Ambulatory Visit | Attending: Gastroenterology | Admitting: Gastroenterology

## 2014-11-03 ENCOUNTER — Encounter (HOSPITAL_COMMUNITY)
Admission: RE | Admit: 2014-11-03 | Discharge: 2014-11-03 | Disposition: A | Discharge status: Home / Self Care | Payer: Self-pay | Source: Ambulatory Visit | Attending: Gastroenterology | Admitting: Gastroenterology

## 2014-11-03 DIAGNOSIS — R112 Nausea with vomiting, unspecified: Secondary | ICD-10-CM

## 2014-11-03 DIAGNOSIS — R109 Unspecified abdominal pain: Secondary | ICD-10-CM

## 2014-11-03 DIAGNOSIS — R1013 Epigastric pain: Secondary | ICD-10-CM

## 2014-11-03 LAB — I-STAT CHEM 8, ED
BUN: 7 mg/dL (ref 6–23)
CHLORIDE: 97 meq/L (ref 96–112)
CREATININE: 1 mg/dL (ref 0.50–1.35)
Calcium, Ion: 1.15 mmol/L (ref 1.12–1.23)
Glucose, Bld: 106 mg/dL — ABNORMAL HIGH (ref 70–99)
HCT: 47 % (ref 39.0–52.0)
Hemoglobin: 16 g/dL (ref 13.0–17.0)
Potassium: 3.7 mEq/L (ref 3.7–5.3)
SODIUM: 138 meq/L (ref 137–147)
TCO2: 27 mmol/L (ref 0–100)

## 2014-11-03 MED ORDER — TECHNETIUM TC 99M MEBROFENIN IV KIT
5.5000 | PACK | Freq: Once | INTRAVENOUS | Status: AC | PRN
  Administered 2014-11-03: 6 via INTRAVENOUS

## 2014-11-03 MED ORDER — SINCALIDE 5 MCG IJ SOLR
0.0200 ug/kg | Freq: Once | INTRAMUSCULAR | Status: AC
  Administered 2014-11-03: 1.2 ug via INTRAVENOUS

## 2014-11-11 ENCOUNTER — Ambulatory Visit (AMBULATORY_SURGERY_CENTER): Payer: Self-pay

## 2014-11-11 VITALS — Ht 71.5 in | Wt 133.8 lb

## 2014-11-11 DIAGNOSIS — R109 Unspecified abdominal pain: Secondary | ICD-10-CM

## 2014-11-11 NOTE — Progress Notes (Signed)
Per pt, no allergies to soy or egg products.Pt not taking any weight loss meds or using  O2 at home. 

## 2014-11-13 ENCOUNTER — Encounter (HOSPITAL_COMMUNITY): Payer: Self-pay | Admitting: Internal Medicine

## 2014-11-17 ENCOUNTER — Encounter: Payer: Self-pay | Admitting: Internal Medicine

## 2014-11-17 ENCOUNTER — Ambulatory Visit (AMBULATORY_SURGERY_CENTER): Payer: Self-pay | Admitting: Internal Medicine

## 2014-11-17 VITALS — BP 117/65 | HR 59 | Temp 97.4°F | Resp 20 | Ht 71.5 in | Wt 133.0 lb

## 2014-11-17 DIAGNOSIS — R109 Unspecified abdominal pain: Secondary | ICD-10-CM

## 2014-11-17 DIAGNOSIS — R112 Nausea with vomiting, unspecified: Secondary | ICD-10-CM

## 2014-11-17 MED ORDER — SODIUM CHLORIDE 0.9 % IV SOLN: 500.0000 mL | INTRAVENOUS | Status: DC

## 2014-11-17 NOTE — Progress Notes (Signed)
A/ox3 pleased with MAC, report to Celia RN 

## 2014-11-17 NOTE — Patient Instructions (Signed)
Discharge instructions given. Handout on hiatal hernia. Biopsies taken. Gastric-emptying scan-do not use reglan for at least 3 days before the test. Office will schedule scan. Resume previous medications. YOU HAD AN ENDOSCOPIC PROCEDURE TODAY AT [facility]: Refer to the procedure report that was given to you for any specific questions about what was found during the examination.  If the procedure report does not answer your questions, please call your gastroenterologist to clarify.  If you requested that your care partner not be given the details of your procedure findings, then the procedure report has been included in a sealed envelope for you to review at your convenience later.  YOU SHOULD EXPECT: Some feelings of bloating in the abdomen. Passage of more gas than usual.  Walking can help get rid of the air that was put into your GI tract during the procedure and reduce the bloating. If you had a lower endoscopy (such as a colonoscopy or flexible sigmoidoscopy) you may notice spotting of blood in your stool or on the toilet paper. If you underwent a bowel prep for your procedure, then you may not have a normal bowel movement for a few days.  DIET: Your first meal following the procedure should be a light meal and then it is ok to progress to your normal diet.  A half-sandwich or bowl of soup is an example of a good first meal.  Heavy or fried foods are harder to digest and may make you feel nauseous or bloated.  Likewise meals heavy in dairy and vegetables can cause extra gas to form and this can also increase the bloating.  Drink plenty of fluids but you should avoid alcoholic beverages for 24 hours.  ACTIVITY: Your care partner should take you home directly after the procedure.  You should plan to take it easy, moving slowly for the rest of the day.  You can resume normal activity the day after the procedure however you should NOT DRIVE or use heavy machinery for 24 hours (because  of the sedation medicines used during the test).    SYMPTOMS TO REPORT IMMEDIATELY: A gastroenterologist can be reached at any hour.  During normal business hours, 8:30 AM to 5:00 PM Monday through Friday, call [phone].  After hours and on weekends, please call the GI answering service at [phone] who will take a message and have the physician on call contact you.   Following upper endoscopy (EGD)  Vomiting of blood or coffee ground material  New chest pain or pain under the shoulder blades  Painful or persistently difficult swallowing  New shortness of breath  Fever of 100F or higher  Black, tarry-looking stools  FOLLOW UP: If any biopsies were taken you will be contacted by phone or by letter within the next 1-3 weeks.  Call your gastroenterologist if you have not heard about the biopsies in 3 weeks.  Our staff will call the home number listed on your records the next business day following your procedure to check on you and address any questions or concerns that you may have at that time regarding the information given to you following your procedure. This is a courtesy call and so if there is no answer at the home number and we have not heard from you through the emergency physician on call, we will assume that you have returned to your regular daily activities without incident.  SIGNATURES/CONFIDENTIALITY: You and/or your care partner have signed paperwork which will be entered into your  electronic medical record.  These signatures attest to the fact that that the information above on your After Visit Summary has been reviewed and is understood.  Full responsibility of the confidentiality of this discharge information lies with you and/or your care-partner.

## 2014-11-17 NOTE — Op Note (Signed)
[place]  [address] [place], [id number]   ENDOSCOPY PROCEDURE REPORT  PATIENT: Anthony Henderson, Anthony Henderson  MR#: [id number] BIRTHDATE: 02-Jul-1981 , 32  yrs. old GENDER: male ENDOSCOPIST: Jerene Bears, MD REFERRED BY:  Lorayne Marek, MD PROCEDURE DATE:  11/17/2014 PROCEDURE:  EGD w/ biopsy for H.pylori ASA CLASS:     Class II INDICATIONS:  nausea and vomiting. MEDICATIONS: Monitored anesthesia care and Propofol 160 mg IV TOPICAL ANESTHETIC: none  DESCRIPTION OF PROCEDURE: After the risks benefits and alternatives of the procedure were thoroughly explained, informed consent was obtained.  The LB [id number] endoscope was introduced through the mouth and advanced to the second portion of the duodenum , Without limitations.  The instrument was slowly withdrawn as the mucosa was fully examined.  ESOPHAGUS: The mucosa of the esophagus appeared normal.  STOMACH: A small hiatal hernia was noted.   The mucosa of the stomach appeared normal.  Cold forcep biopsies were taken at the gastric body, antrum and angularis to evaluate for h.  pylori. Normal pylorus.  DUODENUM: The duodenal mucosa showed no abnormalities in the bulb and 2nd part of the duodenum.  Retroflexed views revealed a hiatal hernia.     The scope was then withdrawn from the patient and the procedure completed.  COMPLICATIONS: There were no immediate complications.  ENDOSCOPIC IMPRESSION: 1.   The mucosa of the esophagus appeared normal 2.   Small hiatal hernia 3.   The mucosa of the stomach appeared normal 4.   The duodenal mucosa showed no abnormalities in the bulb and 2nd part of the duodenum  RECOMMENDATIONS: 1.  Await biopsy results 2.  Follow-up of helicobacter pylori status, treat if indicated 3.  Gastric emptying scan (do not use Reglan (metoclopramide) for at least 3 days before this test).  eSigned:  Jerene Bears, MD 11/17/2014 10:50 AM    CC:The Patient, PCP

## 2014-11-17 NOTE — Progress Notes (Signed)
Called to room to assist during endoscopic procedure.  Patient ID and intended procedure confirmed with present staff. Received instructions for my participation in the procedure from the performing physician.  

## 2014-11-18 ENCOUNTER — Telehealth: Payer: Self-pay

## 2014-11-18 ENCOUNTER — Other Ambulatory Visit: Payer: Self-pay

## 2014-11-18 ENCOUNTER — Telehealth: Payer: Self-pay | Admitting: *Deleted

## 2014-11-18 DIAGNOSIS — R1084 Generalized abdominal pain: Secondary | ICD-10-CM

## 2014-11-18 NOTE — Telephone Encounter (Signed)
Pt scheduled for GES at [facility] 12/02/14@9 :30am, pt to arrive there at 9:15am. Pt to be NPO after midnight, hold reglan for 3 days prior to exam and hold other stomach meds for 24 hours. Pt aware.

## 2014-11-18 NOTE — Telephone Encounter (Signed)
  Follow up Call-  Call back number 11/17/2014  Post procedure Call Back phone  # [phone]  Permission to leave phone message Yes     Patient questions:  Do you have a fever, pain , or abdominal swelling? No. Pain Score  0 *  Have you tolerated food without any problems? Yes.    Have you been able to return to your normal activities? Yes.    Do you have any questions about your discharge instructions: Diet   No. Medications  No. Follow up visit  No.  Do you have questions or concerns about your Care? No.  Actions: * If pain score is 4 or above: No action needed, pain <4.

## 2014-11-19 ENCOUNTER — Telehealth: Payer: Self-pay | Admitting: Internal Medicine

## 2014-11-19 NOTE — Telephone Encounter (Signed)
Pt states that since his EGD on Monday he has an aching feeling between the bottom part of his chest and the upper part of his stomach. States he has not had any nausea and he is taking his protonix. Describes it as a "hollow" feeling that aches. Please advise.

## 2014-11-19 NOTE — Telephone Encounter (Signed)
Spoke with pt and he is aware. Pt states he is at work and does not want to come for the xays. Pt was instructed to call back if he did not have improvement, he verbalized understanding.

## 2014-11-19 NOTE — Telephone Encounter (Signed)
Unclear cause, would check cxr and 2 view abd to rule out complication, though unlikely Biopsies still pending. Can use APAP 100 mg every 6 hours for aching pain, but if this is secondary to procedure it should subside very soon

## 2014-11-24 ENCOUNTER — Encounter: Payer: Self-pay | Admitting: Internal Medicine

## 2014-11-26 ENCOUNTER — Telehealth: Payer: Self-pay | Admitting: Internal Medicine

## 2014-11-26 NOTE — Telephone Encounter (Signed)
Spoke with pt and he is aware of path results.

## 2014-12-02 ENCOUNTER — Ambulatory Visit (HOSPITAL_COMMUNITY)
Admission: RE | Admit: 2014-12-02 | Discharge: 2014-12-02 | Disposition: A | Discharge status: Home / Self Care | Payer: Self-pay | Source: Ambulatory Visit | Attending: Internal Medicine | Admitting: Internal Medicine

## 2014-12-02 DIAGNOSIS — R1084 Generalized abdominal pain: Secondary | ICD-10-CM

## 2014-12-15 ENCOUNTER — Encounter (HOSPITAL_COMMUNITY)
Admission: RE | Admit: 2014-12-15 | Discharge: 2014-12-15 | Disposition: A | Discharge status: Home / Self Care | Payer: BLUE CROSS/BLUE SHIELD | Source: Ambulatory Visit | Attending: Internal Medicine | Admitting: Internal Medicine

## 2014-12-15 DIAGNOSIS — R110 Nausea: Secondary | ICD-10-CM | POA: Diagnosis not present

## 2014-12-15 DIAGNOSIS — R109 Unspecified abdominal pain: Secondary | ICD-10-CM | POA: Insufficient documentation

## 2014-12-15 MED ORDER — TECHNETIUM TC 99M SULFUR COLLOID
2.2000 | Freq: Once | INTRAVENOUS | Status: AC | PRN
Start: 2014-12-15 — End: 2014-12-15
  Administered 2014-12-15: 2.2 via ORAL

## 2015-03-11 ENCOUNTER — Other Ambulatory Visit: Payer: Self-pay

## 2015-03-11 ENCOUNTER — Telehealth: Payer: Self-pay

## 2015-03-11 ENCOUNTER — Telehealth: Payer: Self-pay | Admitting: Internal Medicine

## 2015-03-11 MED ORDER — FLECAINIDE ACETATE 100 MG PO TABS: 100.0000 mg | ORAL_TABLET | Freq: Two times a day (BID) | ORAL | Status: DC

## 2015-03-11 NOTE — Telephone Encounter (Signed)
Pt requesting Flecinide refill--went off about 6 months ago-episode of fast heartbeat today and last month-want to go back on- uses [facility] [place] [place] pt [phone]

## 2015-03-11 NOTE — Telephone Encounter (Signed)
New Message    Patient is calling about his previous call about Flecinide refill. Please give patient a call.

## 2015-03-12 NOTE — Telephone Encounter (Signed)
Appointment made

## 2015-04-07 ENCOUNTER — Ambulatory Visit (INDEPENDENT_AMBULATORY_CARE_PROVIDER_SITE_OTHER): Payer: BLUE CROSS/BLUE SHIELD | Admitting: Internal Medicine

## 2015-04-07 ENCOUNTER — Encounter: Payer: Self-pay | Admitting: Internal Medicine

## 2015-04-07 VITALS — BP 100/76 | HR 80 | Ht 71.5 in | Wt 139.4 lb

## 2015-04-07 DIAGNOSIS — I471 Supraventricular tachycardia: Secondary | ICD-10-CM | POA: Diagnosis not present

## 2015-04-07 NOTE — Progress Notes (Signed)
HPI Anthony Henderson returns today for followup. He is a pleasant 34 yo man with a h/o SVT who underwent cathter ablation over a year ago. He had a very difficult posteroseptal AP. During ablation, his ablation had to be carried out inside the CS and he then developed atrial fib and no additional ablation could be carried out. He has had 2 episode of recurrent palpitations. We prescribed flecainide but he has only taken 50 mg a day if that. He has been anxious about taking this medication. His episodes of palpitations have been terminated with vagal maneuvers. He is not inclined at this point to undergo repeat ablation. Allergies  Allergen Reactions  . Penicillins Hives     Current Outpatient Prescriptions  Medication Sig Dispense Refill  . acetaminophen (TYLENOL) 500 MG tablet Take 500 mg by mouth every 6 (six) hours as needed for moderate pain.    . flecainide (TAMBOCOR) 100 MG tablet Take 1 tablet (100 mg total) by mouth 2 (two) times daily. 180 tablet 0   No current facility-administered medications for this visit.     Past Medical History  Diagnosis Date  . SVT (supraventricular tachycardia)     s/p ablation 03-10-14 by Dr Lovena Le  . Depression   . High risk sexual behavior   . Other malaise and fatigue   . Anxiety     "related to SVT"  . Panic attack   . Atrial fibrillation     following RFCA of SVT 03/2014 - on Flecainide  . Abdominal pain   . Post-operative nausea and vomiting     ROS:   All systems reviewed and negative except as noted in the HPI.   Past Surgical History  Procedure Laterality Date  . Ablation  03/10/2014    RFCA of left posterior septal accessory pathyway by Dr Lovena Le with resultant atrial fibrillation  . Supraventricular tachycardia ablation N/A 03/10/2014    Procedure: SUPRAVENTRICULAR TACHYCARDIA ABLATION;  Surgeon: Evans Lance, MD;  Location: [facility] CATH LAB;  Service: Cardiovascular;  Laterality: N/A;     Family History  Problem Relation Age  of Onset  . Diabetes Maternal Grandmother   . Colon cancer Neg Hx   . Esophageal cancer Neg Hx   . Rectal cancer Neg Hx   . Stomach cancer Neg Hx      History   Social History  . Marital Status: Married    Spouse Name: N/A  . Number of Children: 3  . Years of Education: N/A   Occupational History  . [employer]    Social History Main Topics  . Smoking status: Former Smoker -- 0.25 packs/day for 10 years    Types: Cigarettes    Quit date: 02/03/2014  . Smokeless tobacco: Never Used  . Alcohol Use: No     Comment: 03/10/2014 "might have a few drinks 1-2 times/yr, if that"  . Drug Use: No  . Sexual Activity: Yes   Other Topics Concern  . Not on file   Social History Narrative     BP 100/76 mmHg  Pulse 80  Ht 5' 11.5" (1.816 m)  Wt 139 lb 6.4 oz (63.231 kg)  BMI 19.17 kg/m2  Physical Exam:  Well appearing 34 yo man, NAD HEENT: Unremarkable Neck:  No JVD, no thyromegally Back:  No CVA tenderness Lungs:  Clear with no wheezes HEART:  Regular rate rhythm, no murmurs, no rubs, no clicks Abd:  soft, positive bowel sounds, no organomegally, no  rebound, no guarding Ext:  2 plus pulses, no edema, no cyanosis, no clubbing Skin:  No rashes no nodules Neuro:  CN II through XII intact, motor grossly intact   Assess/Plan:

## 2015-04-07 NOTE — Patient Instructions (Signed)
Medication Instructions:  Your physician recommends that you continue on your current medications as directed. Please refer to the Current Medication list given to you today.   Labwork: None ordered  Testing/Procedures: None ordered  Follow-Up: Your physician wants you to follow-up in: 6 months with Dr Knox Saliva will receive a reminder letter in the mail two months in advance. If you don't receive a letter, please call our office to schedule the follow-up appointment.   Any Other Special Instructions Will Be Listed Below (If Applicable).

## 2015-04-07 NOTE — Assessment & Plan Note (Signed)
His symptoms have recurred and we have discussed the possible treatment options. I have recommended he take flecainide 50 mg twice daily. Will see him back in 6 months, sooner if he has more palpitations.

## 2015-07-12 IMAGING — CR DG CHEST 2V
2 series · 2 of 2 positions shown · non-contrast
Comparison: 10/09/2014

CLINICAL DATA: Spitting up blood.

EXAM:
CHEST  2 VIEW

[w chest pa]
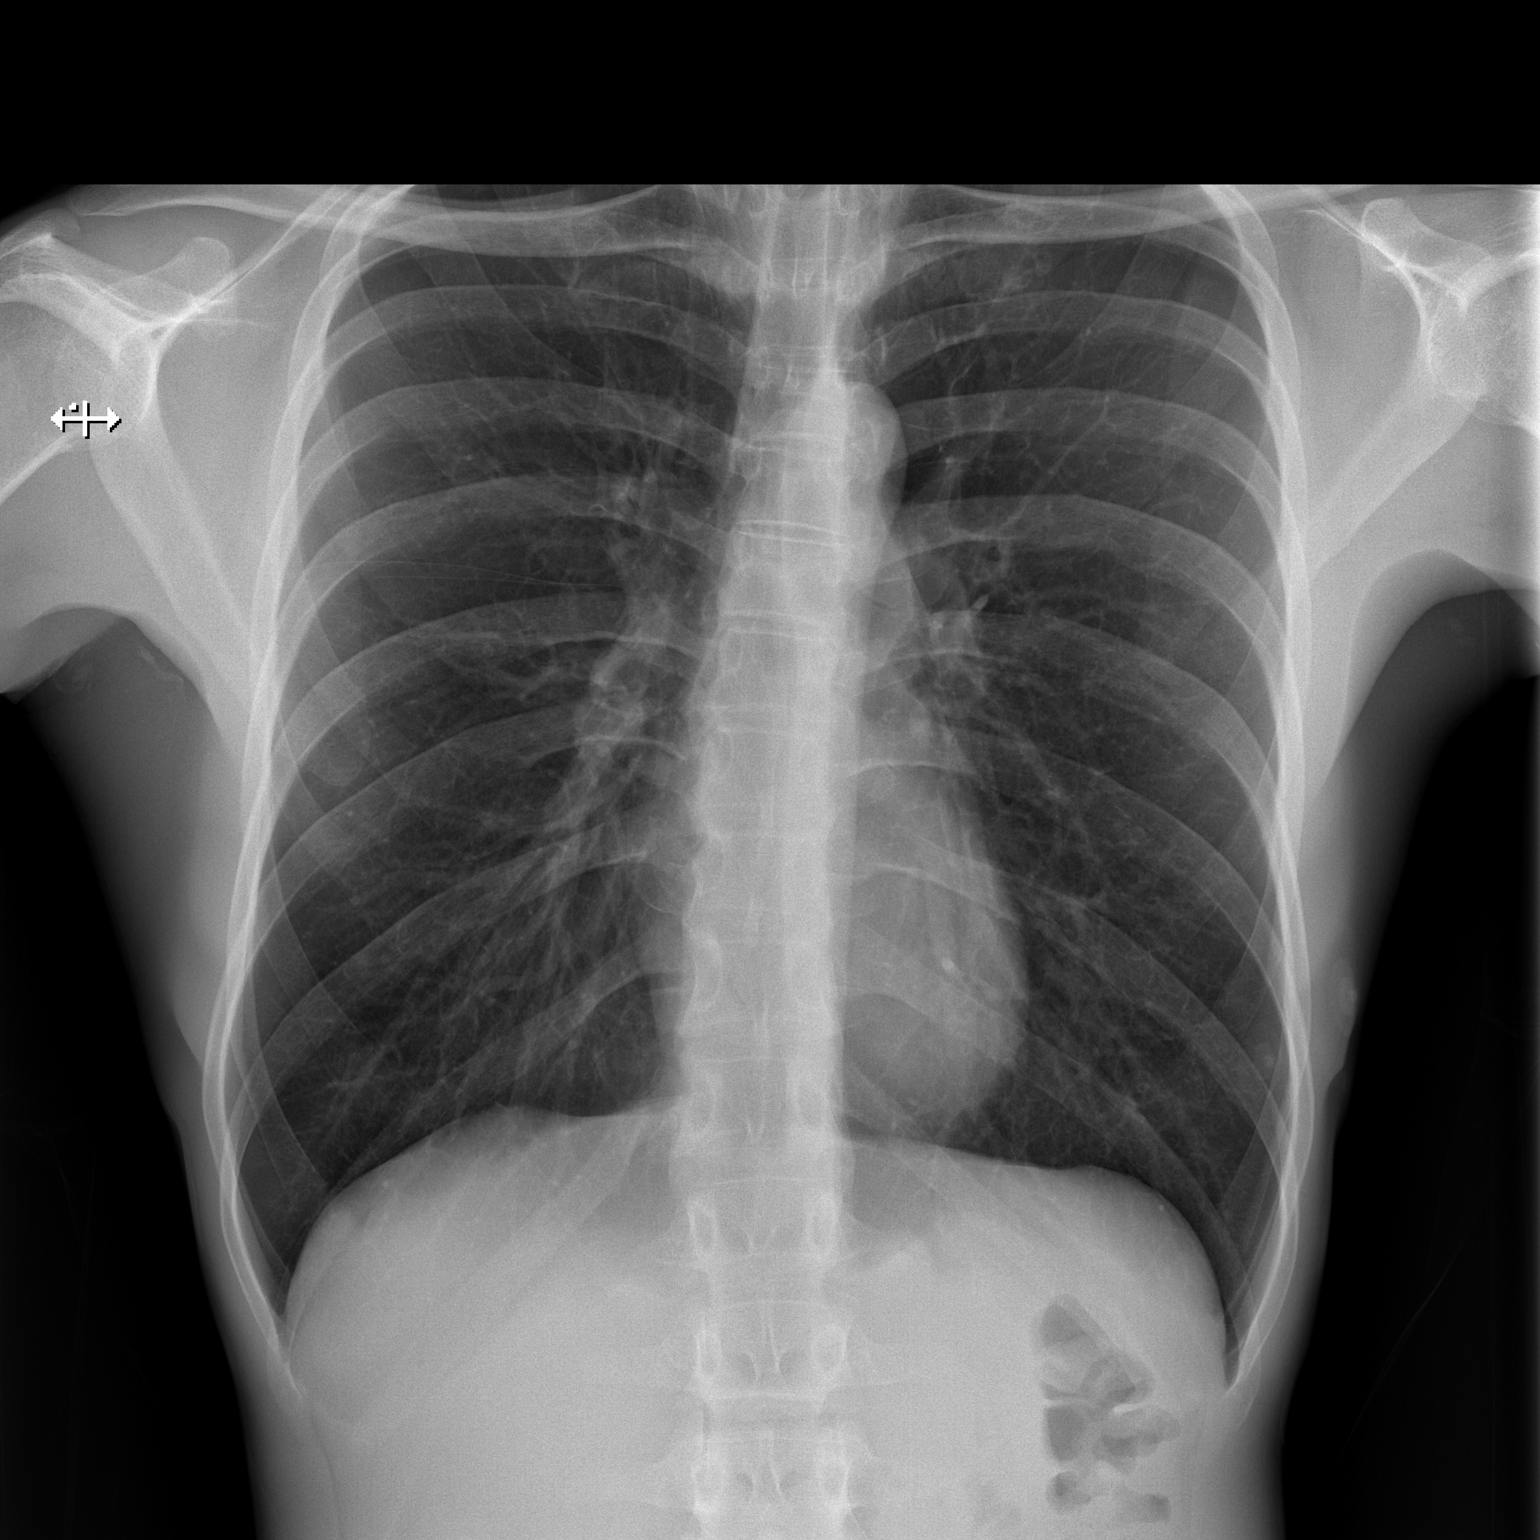

[w chest lat]
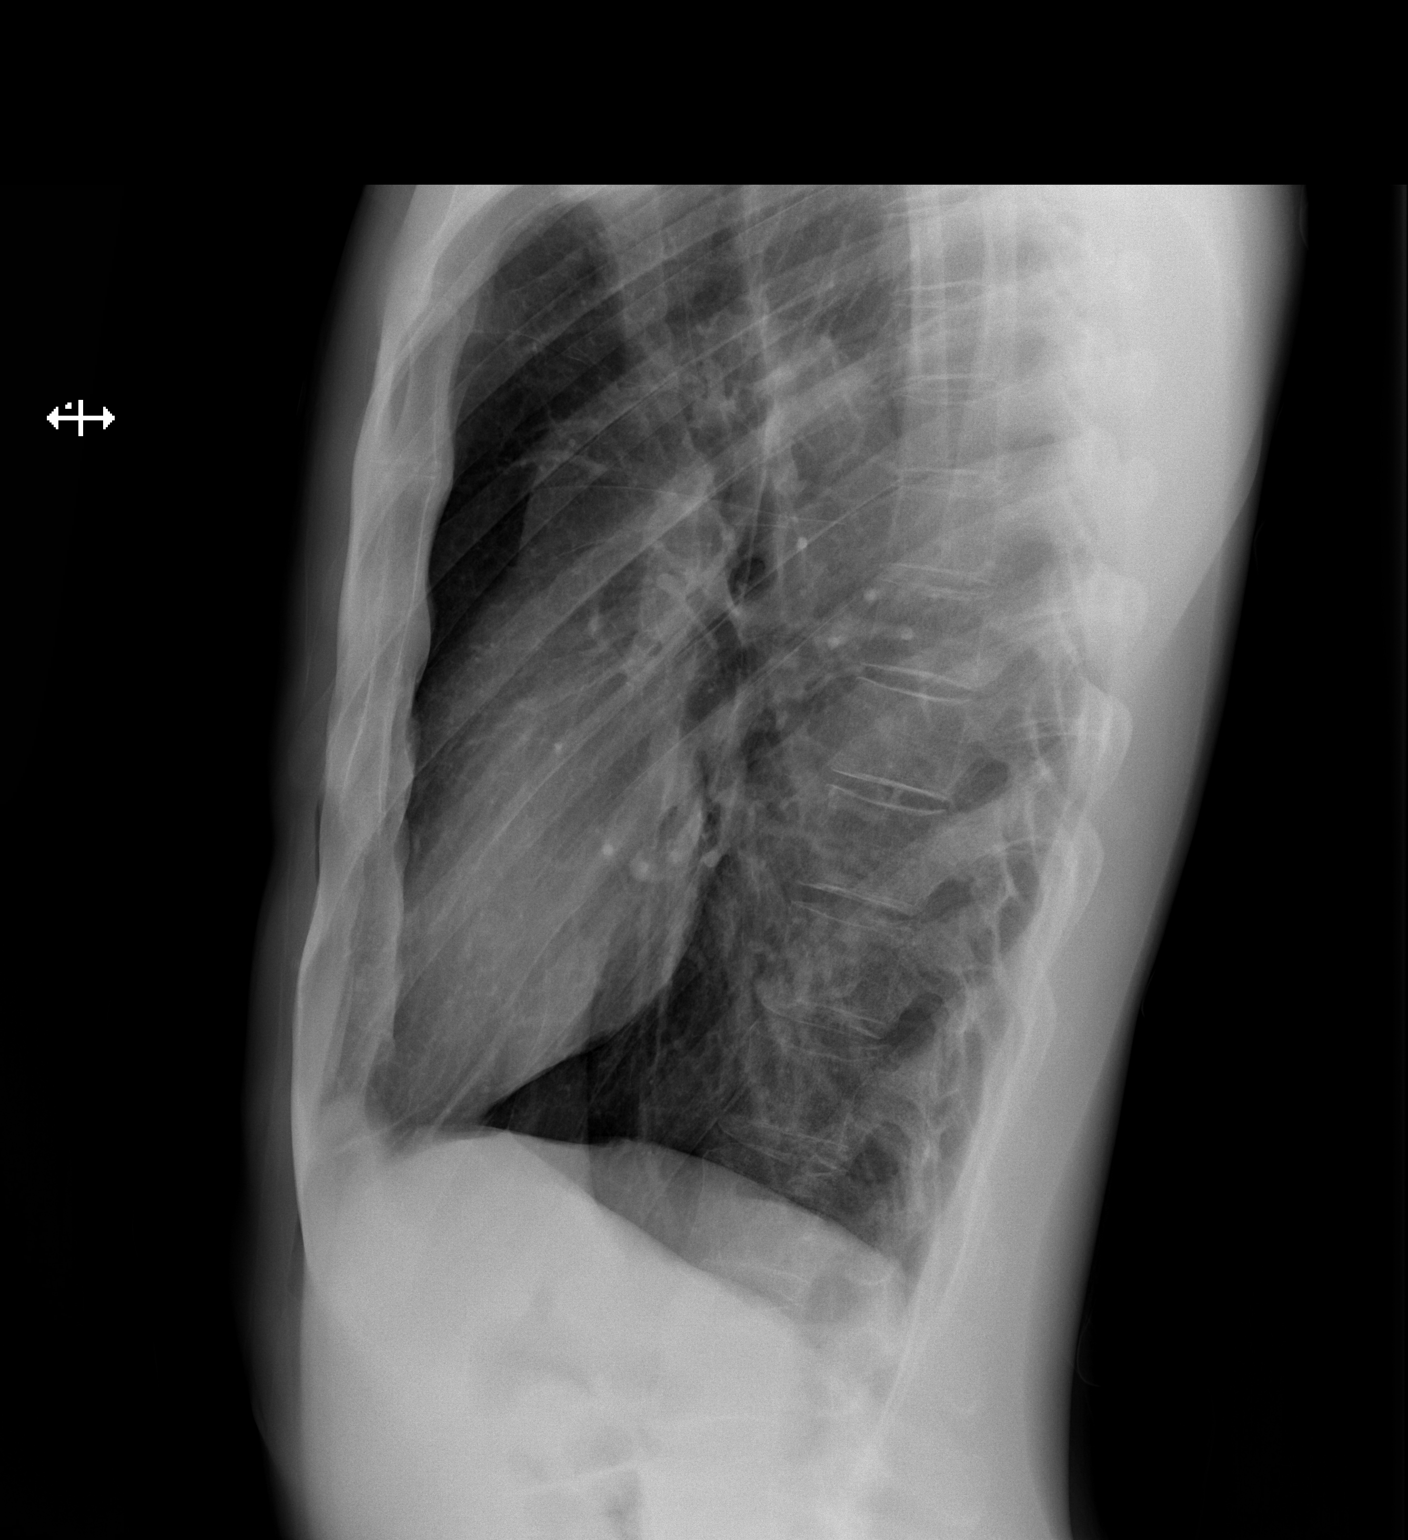

[2 of 2 positions shown; findings below may reference images not displayed]

FINDINGS: The heart size and mediastinal contours are within normal limits.
Both lungs are clear. The visualized skeletal structures are
unremarkable.
IMPRESSION: No active cardiopulmonary disease.

## 2015-07-17 IMAGING — NM NM HEPATO W/GB/PHARM/[PERSON_NAME]
1 series · 12 of 12 positions shown · non-contrast
Comparison: Abdominal ultrasound of today's date.

CLINICAL DATA: Abdominal pain with nausea and vomiting.

EXAM:
NUCLEAR MEDICINE HEPATOBILIARY IMAGING WITH GALLBLADDER EF
TECHNIQUE: Sequential images of the abdomen were obtained [DATE] minutes
following intravenous administration of radiopharmaceutical. After
slow intravenous infusion of 1.2 micrograms Cholecystokinin,
gallbladder ejection fraction was determined.
RADIOPHARMACEUTICALS:  5.5 Millicurie Qc-44m Choletec

[Series 1: hepato · 4.46mm/px · 2 acquisitions, 12 frames shown]
[im 1/2]
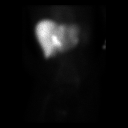
[im 1/2]
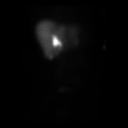
[im 1/2]
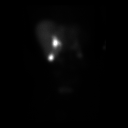
[im 1/2]
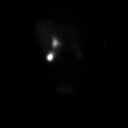
[im 1/2]
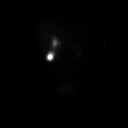
[im 1/2]
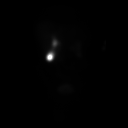
[im 2/2]
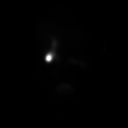
[im 2/2]
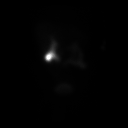
[im 2/2]
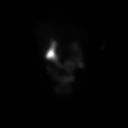
[im 2/2]
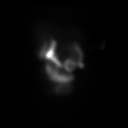
[im 2/2]
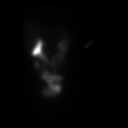
[im 2/2]
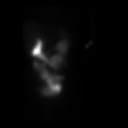

[12 of 12 positions shown; findings below may reference images not displayed]

FINDINGS: There is adequate uptake of the radiopharmaceutical by the liver.
Activity is visible by 10 min in the intrahepatic ducts. The
gallbladder becomes visible by 20 min. Bowel activity is evident by
approximately 20-25 min.. The 30 min gallbladder ejection fraction
is 59%. At 30 min, normal ejection fraction is expected to be
greater than 30%.

The patient did not experience symptoms during CCK infusion.
IMPRESSION: Normal hepatobiliary scan with normal gallbladder ejection fraction.

## 2015-08-08 ENCOUNTER — Other Ambulatory Visit: Payer: Self-pay | Admitting: Internal Medicine

## 2015-08-11 NOTE — Telephone Encounter (Signed)
Prescription was for 100 mg twice daily. He decided to take 50 mg daily or twice daily. Keep prescription at 100 bid. GT

## 2016-01-04 ENCOUNTER — Encounter (HOSPITAL_COMMUNITY): Payer: Self-pay | Admitting: Emergency Medicine

## 2016-01-04 ENCOUNTER — Emergency Department (HOSPITAL_COMMUNITY)
Admission: EM | Admit: 2016-01-04 | Discharge: 2016-01-04 | Disposition: A | Discharge status: Home / Self Care | Payer: BLUE CROSS/BLUE SHIELD | Attending: Emergency Medicine | Admitting: Emergency Medicine

## 2016-01-04 DIAGNOSIS — Z880 Allergy status to penicillin: Secondary | ICD-10-CM | POA: Insufficient documentation

## 2016-01-04 DIAGNOSIS — R6883 Chills (without fever): Secondary | ICD-10-CM | POA: Diagnosis not present

## 2016-01-04 DIAGNOSIS — R112 Nausea with vomiting, unspecified: Secondary | ICD-10-CM | POA: Insufficient documentation

## 2016-01-04 DIAGNOSIS — Z87891 Personal history of nicotine dependence: Secondary | ICD-10-CM | POA: Insufficient documentation

## 2016-01-04 DIAGNOSIS — F419 Anxiety disorder, unspecified: Secondary | ICD-10-CM | POA: Diagnosis not present

## 2016-01-04 DIAGNOSIS — F329 Major depressive disorder, single episode, unspecified: Secondary | ICD-10-CM | POA: Diagnosis not present

## 2016-01-04 DIAGNOSIS — R197 Diarrhea, unspecified: Secondary | ICD-10-CM | POA: Diagnosis not present

## 2016-01-04 LAB — URINALYSIS, ROUTINE W REFLEX MICROSCOPIC
Bilirubin Urine: NEGATIVE
GLUCOSE, UA: NEGATIVE mg/dL
Hgb urine dipstick: NEGATIVE
KETONES UR: NEGATIVE mg/dL
LEUKOCYTES UA: NEGATIVE
Nitrite: NEGATIVE
PH: 7.5 (ref 5.0–8.0)
PROTEIN: NEGATIVE mg/dL
Specific Gravity, Urine: 1.027 (ref 1.005–1.030)

## 2016-01-04 LAB — CBC WITH DIFFERENTIAL/PLATELET
BASOS ABS: 0 10*3/uL (ref 0.0–0.1)
BASOS PCT: 0 %
Eosinophils Absolute: 0.1 10*3/uL (ref 0.0–0.7)
Eosinophils Relative: 1 %
HEMATOCRIT: 45.1 % (ref 39.0–52.0)
HEMOGLOBIN: 15.3 g/dL (ref 13.0–17.0)
LYMPHS PCT: 18 %
Lymphs Abs: 1.3 10*3/uL (ref 0.7–4.0)
MCH: 30.2 pg (ref 26.0–34.0)
MCHC: 33.9 g/dL (ref 30.0–36.0)
MCV: 89.1 fL (ref 78.0–100.0)
MONO ABS: 0.4 10*3/uL (ref 0.1–1.0)
Monocytes Relative: 6 %
NEUTROS ABS: 5.5 10*3/uL (ref 1.7–7.7)
Neutrophils Relative %: 75 %
Platelets: 239 10*3/uL (ref 150–400)
RBC: 5.06 MIL/uL (ref 4.22–5.81)
RDW: 12 % (ref 11.5–15.5)
WBC: 7.2 10*3/uL (ref 4.0–10.5)

## 2016-01-04 LAB — COMPREHENSIVE METABOLIC PANEL
ALT: 16 U/L — ABNORMAL LOW (ref 17–63)
AST: 28 U/L (ref 15–41)
Albumin: 4.3 g/dL (ref 3.5–5.0)
Alkaline Phosphatase: 47 U/L (ref 38–126)
Anion gap: 12 (ref 5–15)
BILIRUBIN TOTAL: 0.6 mg/dL (ref 0.3–1.2)
BUN: 11 mg/dL (ref 6–20)
CO2: 27 mmol/L (ref 22–32)
CREATININE: 1.02 mg/dL (ref 0.61–1.24)
Calcium: 9.6 mg/dL (ref 8.9–10.3)
Chloride: 103 mmol/L (ref 101–111)
GFR calc Af Amer: >60 mL/min (ref >60)
GFR calc non Af Amer: >60 mL/min (ref >60)
GLUCOSE: 127 mg/dL — AB (ref 65–99)
POTASSIUM: 3.2 mmol/L — AB (ref 3.5–5.1)
Sodium: 142 mmol/L (ref 135–145)
TOTAL PROTEIN: 7 g/dL (ref 6.5–8.1)

## 2016-01-04 LAB — HIV ANTIBODY (ROUTINE TESTING W REFLEX): HIV Screen 4th Generation wRfx: NONREACTIVE

## 2016-01-04 LAB — LIPASE, BLOOD: LIPASE: 29 U/L (ref 11–51)

## 2016-01-04 MED ORDER — METOCLOPRAMIDE HCL 5 MG/ML IJ SOLN
10.0000 mg | Freq: Once | INTRAMUSCULAR | Status: AC
  Administered 2016-01-04: 10 mg via INTRAVENOUS
  Filled 2016-01-04: qty 2

## 2016-01-04 MED ORDER — DIPHENOXYLATE-ATROPINE 2.5-0.025 MG PO TABS
2.0000 | ORAL_TABLET | Freq: Once | ORAL | Status: AC
  Administered 2016-01-04: 2 via ORAL
  Filled 2016-01-04: qty 2

## 2016-01-04 MED ORDER — PROMETHAZINE HCL 25 MG PO TABS: 25.0000 mg | ORAL_TABLET | Freq: Four times a day (QID) | ORAL | Status: DC | PRN

## 2016-01-04 MED ORDER — SODIUM CHLORIDE 0.9 % IV BOLUS (SEPSIS)
1000.0000 mL | Freq: Once | INTRAVENOUS | Status: AC
  Administered 2016-01-04: 1000 mL via INTRAVENOUS

## 2016-01-04 MED ORDER — ONDANSETRON HCL 4 MG/2ML IJ SOLN
4.0000 mg | Freq: Once | INTRAMUSCULAR | Status: AC
  Administered 2016-01-04: 4 mg via INTRAVENOUS
  Filled 2016-01-04: qty 2

## 2016-01-04 MED ORDER — DIPHENOXYLATE-ATROPINE 2.5-0.025 MG PO TABS: 2.0000 | ORAL_TABLET | Freq: Four times a day (QID) | ORAL | Status: DC | PRN

## 2016-01-04 NOTE — Discharge Instructions (Signed)

## 2016-01-04 NOTE — ED Notes (Signed)
Woke up with n/v/d at 0100.  Reports vomiting approx 5-6 times.

## 2016-01-04 NOTE — ED Provider Notes (Signed)
CSN: [id number]     Arrival date & time 01/04/16  0309 History   By signing my name below, I, Terrance Branch, attest that this documentation has been prepared under the direction and in the presence of Orpah Greek, MD. Electronically Signed: Randa Evens, ED Scribe. 01/04/2016. 3:16 AM.   No chief complaint on file.   The history is provided by the patient. No language interpreter was used.   HPI Comments: Anthony Henderson is a 35 y.o. male who presents to the Emergency Department complaining of sudden nausea and vomiting onset tonight PTA. Pt reports associated chills and diarrhea. Pt doesn't report any medications PTA. Pt denies recent sick contacts. Pt doesn't report any other symptoms.     Past Medical History  Diagnosis Date  . SVT (supraventricular tachycardia)     s/p ablation 03-10-14 by Dr Lovena Le  . Depression   . High risk sexual behavior   . Other malaise and fatigue   . Anxiety     "related to SVT"  . Panic attack   . Atrial fibrillation     following RFCA of SVT 03/2014 - on Flecainide  . Abdominal pain   . Post-operative nausea and vomiting    Past Surgical History  Procedure Laterality Date  . Ablation  03/10/2014    RFCA of left posterior septal accessory pathyway by Dr Lovena Le with resultant atrial fibrillation  . Supraventricular tachycardia ablation N/A 03/10/2014    Procedure: SUPRAVENTRICULAR TACHYCARDIA ABLATION;  Surgeon: Evans Lance, MD;  Location: [facility] CATH LAB;  Service: Cardiovascular;  Laterality: N/A;   Family History  Problem Relation Age of Onset  . Diabetes Maternal Grandmother   . Colon cancer Neg Hx   . Esophageal cancer Neg Hx   . Rectal cancer Neg Hx   . Stomach cancer Neg Hx    Social History  Substance Use Topics  . Smoking status: Former Smoker -- 0.25 packs/day for 10 years    Types: Cigarettes    Quit date: 02/03/2014  . Smokeless tobacco: Never Used  . Alcohol Use: No     Comment: 03/10/2014 "might have a few drinks 1-2  times/yr, if that"    Review of Systems  Constitutional: Positive for chills.  Gastrointestinal: Positive for nausea, vomiting and diarrhea.  All other systems reviewed and are negative.     Allergies  Penicillins  Home Medications   Prior to Admission medications   Medication Sig Start Date End Date Taking? Authorizing Provider  acetaminophen (TYLENOL) 500 MG tablet Take 500 mg by mouth every 6 (six) hours as needed for moderate pain.    Historical Provider, MD  flecainide (TAMBOCOR) 100 MG tablet TAKE 1 TABLET BY MOUTH TWICE A DAY 08/11/15   Evans Lance, MD   There were no vitals taken for this visit.   Physical Exam  Constitutional: He is oriented to person, place, and time. He appears well-developed and well-nourished. No distress.  HENT:  Head: Normocephalic and atraumatic.  Right Ear: Hearing normal.  Left Ear: Hearing normal.  Nose: Nose normal.  Mouth/Throat: Oropharynx is clear and moist and mucous membranes are normal.  Eyes: Conjunctivae and EOM are normal. Pupils are equal, round, and reactive to light.  Neck: Normal range of motion. Neck supple.  Cardiovascular: Regular rhythm, S1 normal and S2 normal.  Exam reveals no gallop and no friction rub.   No murmur heard. Pulmonary/Chest: Effort normal and breath sounds normal. No respiratory distress. He exhibits no tenderness.  Abdominal:  Soft. Normal appearance and bowel sounds are normal. There is no hepatosplenomegaly. There is no tenderness. There is no rebound, no guarding, no tenderness at McBurney's point and negative Murphy's sign. No hernia.  Musculoskeletal: Normal range of motion.  Neurological: He is alert and oriented to person, place, and time. He has normal strength. No cranial nerve deficit or sensory deficit. Coordination normal. GCS eye subscore is 4. GCS verbal subscore is 5. GCS motor subscore is 6.  Skin: Skin is warm, dry and intact. No rash noted. No cyanosis.  Psychiatric: He has a normal mood  and affect. His speech is normal and behavior is normal. Thought content normal.  Nursing note and vitals reviewed.   ED Course  Procedures (including critical care time) DIAGNOSTIC STUDIES:   COORDINATION OF CARE:    Labs Review Labs Reviewed - No data to display  Imaging Review No results found. I have personally reviewed and evaluated these images and lab results as part of my medical decision-making.   EKG Interpretation None      MDM   Final diagnoses:  None   vomiting diarrhea  Presents to the emergency department for evaluation of nausea, vomiting and diarrhea. Patient reports that he has had multiple episodes of each. He is experiencing cramping with diarrhea, but no continuous abdominal pain. Abdominal exam was benign. Lab work is unremarkable. Patient feeling improvement after treatment with antibiotics and fluids. Will continue outpatient treatment.  I personally performed the services described in this documentation, which was scribed in my presence. The recorded information has been reviewed and is accurate.      Orpah Greek, MD 01/04/16 [phone]

## 2016-03-15 ENCOUNTER — Emergency Department (HOSPITAL_COMMUNITY): Payer: BLUE CROSS/BLUE SHIELD

## 2016-03-15 ENCOUNTER — Encounter (HOSPITAL_COMMUNITY): Payer: Self-pay | Admitting: Emergency Medicine

## 2016-03-15 ENCOUNTER — Emergency Department (HOSPITAL_COMMUNITY)
Admission: EM | Admit: 2016-03-15 | Discharge: 2016-03-15 | Disposition: A | Discharge status: Home / Self Care | Payer: BLUE CROSS/BLUE SHIELD | Attending: Emergency Medicine | Admitting: Emergency Medicine

## 2016-03-15 DIAGNOSIS — Z8659 Personal history of other mental and behavioral disorders: Secondary | ICD-10-CM | POA: Insufficient documentation

## 2016-03-15 DIAGNOSIS — R1013 Epigastric pain: Secondary | ICD-10-CM | POA: Insufficient documentation

## 2016-03-15 DIAGNOSIS — R1011 Right upper quadrant pain: Secondary | ICD-10-CM | POA: Insufficient documentation

## 2016-03-15 DIAGNOSIS — Z87891 Personal history of nicotine dependence: Secondary | ICD-10-CM | POA: Insufficient documentation

## 2016-03-15 DIAGNOSIS — I4891 Unspecified atrial fibrillation: Secondary | ICD-10-CM | POA: Diagnosis not present

## 2016-03-15 DIAGNOSIS — Z79899 Other long term (current) drug therapy: Secondary | ICD-10-CM | POA: Insufficient documentation

## 2016-03-15 DIAGNOSIS — Z880 Allergy status to penicillin: Secondary | ICD-10-CM | POA: Insufficient documentation

## 2016-03-15 DIAGNOSIS — K219 Gastro-esophageal reflux disease without esophagitis: Secondary | ICD-10-CM | POA: Diagnosis not present

## 2016-03-15 HISTORY — DX: Diaphragmatic hernia without obstruction or gangrene: K44.9

## 2016-03-15 HISTORY — DX: Gastro-esophageal reflux disease without esophagitis: K21.9

## 2016-03-15 MED ORDER — GI COCKTAIL ~~LOC~~
30.0000 mL | Freq: Once | ORAL | Status: AC
  Administered 2016-03-15: 30 mL via ORAL
  Filled 2016-03-15: qty 30

## 2016-03-15 MED ORDER — SUCRALFATE 1 G PO TABS: 1.0000 g | ORAL_TABLET | Freq: Three times a day (TID) | ORAL | Status: DC

## 2016-03-15 NOTE — ED Notes (Signed)
Per pt, states hiatal hernia-diagnosed a year ago-states GERD epigastric burning after PO intake

## 2016-03-15 NOTE — ED Provider Notes (Signed)
CSN: [id number]     Arrival date & time 03/15/16  1325 History  By signing my name below, I, Stephania Fragmin, attest that this documentation has been prepared under the direction and in the presence of [facility], PA-C. Electronically Signed: Stephania Fragmin, ED Scribe. 03/15/2016. 3:30 PM.    Chief Complaint  Patient presents with  . Hiatal Hernia   The history is provided by the patient. No language interpreter was used.    HPI Comments: Anthony Henderson is a 35 y.o. male with a history of GERD, hiatal hernia who presents to the Emergency Department complaining of intermittent, burning, epigastric pain after PO intake, that began 2 days ago after drinking a Heineken beer and eating a salad with [facility] dressing. He states the pain feels similar to his usual GERD symptoms however much more severe. Patient states he has taken Zantac and Prilosec with moderate relief. He reports he normally takes Prilosec prn, but he does not take it regularly.  Patient notes his symptoms feel significantly improved since taking the medications, but he still feels mild, intermittent epigastric burning presently. Patient is followed by [facility] where he was told to follow up as needed and has not needed to see them recently. He denies fever, chills, chest pain, or SOB.   Past Medical History  Diagnosis Date  . SVT (supraventricular tachycardia) ([place])     s/p ablation 03-10-14 by Dr Lovena Le  . Depression   . High risk sexual behavior   . Other malaise and fatigue   . Anxiety     "related to SVT"  . Panic attack   . Atrial fibrillation ([place])     following RFCA of SVT 03/2014 - on Flecainide  . Abdominal pain   . Post-operative nausea and vomiting   . Hiatal hernia   . GERD (gastroesophageal reflux disease)    Past Surgical History  Procedure Laterality Date  . Ablation  03/10/2014    RFCA of left posterior septal accessory pathyway by Dr Lovena Le with resultant atrial fibrillation  . Supraventricular tachycardia  ablation N/A 03/10/2014    Procedure: SUPRAVENTRICULAR TACHYCARDIA ABLATION;  Surgeon: Evans Lance, MD;  Location: [facility] CATH LAB;  Service: Cardiovascular;  Laterality: N/A;   Family History  Problem Relation Age of Onset  . Diabetes Maternal Grandmother   . Colon cancer Neg Hx   . Esophageal cancer Neg Hx   . Rectal cancer Neg Hx   . Stomach cancer Neg Hx    Social History  Substance Use Topics  . Smoking status: Former Smoker -- 0.25 packs/day for 10 years    Types: Cigarettes    Quit date: 02/03/2014  . Smokeless tobacco: Never Used  . Alcohol Use: No     Comment: 03/10/2014 "might have a few drinks 1-2 times/yr, if that"    Review of Systems  Constitutional: Negative for fever and chills.  Respiratory: Negative for shortness of breath.   Cardiovascular: Negative for chest pain.  Gastrointestinal: Positive for abdominal pain (burning epigastric pain).  All other systems reviewed and are negative.  Allergies  Penicillins  Home Medications   Prior to Admission medications   Medication Sig Start Date End Date Taking? Authorizing Provider  diphenoxylate-atropine (LOMOTIL) 2.5-0.025 MG tablet Take 2 tablets by mouth 4 (four) times daily as needed for diarrhea or loose stools. 01/04/16   Orpah Greek, MD  flecainide (TAMBOCOR) 100 MG tablet TAKE 1 TABLET BY MOUTH TWICE A DAY 08/11/15   Champ Mungo  Lovena Le, MD  promethazine (PHENERGAN) 25 MG tablet Take 1 tablet (25 mg total) by mouth every 6 (six) hours as needed for nausea or vomiting. 01/04/16   Orpah Greek, MD  sucralfate (CARAFATE) 1 g tablet Take 1 tablet (1 g total) by mouth 4 (four) times daily -  with meals and at bedtime. 03/15/16   Masami Plata Pilcher Aryaa Bunting, PA-C   BP 114/82 mmHg  Pulse 92  Temp(Src) 98.5 F (36.9 C) (Oral)  SpO2 98% Physical Exam  Constitutional: He is oriented to person, place, and time. He appears well-developed and well-nourished. No distress.  HENT:  Head: Normocephalic and atraumatic.   Neck: Neck supple. No tracheal deviation present.  Cardiovascular: Normal rate.   Pulmonary/Chest: Effort normal. No respiratory distress.  Abdominal: Soft. Bowel sounds are normal. He exhibits no distension and no mass. There is no rebound and no guarding.  Epigastric and right upper quadrant tenderness. Negative Murphy's.  Musculoskeletal: Normal range of motion.  Neurological: He is alert and oriented to person, place, and time.  Skin: Skin is warm and dry. He is not diaphoretic.  Psychiatric: He has a normal mood and affect. His behavior is normal.  Nursing note and vitals reviewed.   ED Course  Procedures (including critical care time)  DIAGNOSTIC STUDIES: Oxygen Saturation is 98% on RA, normal by my interpretation.    COORDINATION OF CARE: 3:20 PM - Discussed treatment plan with pt at bedside which includes ultrasound of gallbladder. Pt verbalized understanding and agreed to plan.   MDM   Final diagnoses:  Epigastric pain   Anthony Henderson is a 35 y.o. male with PMH of hiatal hernia and GERD who is followed by [facility]. He presents to the ER complaining of epigastric burning pain which began after drinking a Heineken and eating a salad on Sunday (2 days ago). Patient states his symptoms are consistent with usual GERD symptoms, but more severe. Right upper quadrant ultrasound was performed which was unremarkable. GI cocktail was given in ED. Patient was reevaluated and states complete resolution of symptoms. Repeat abdominal exam with nonsurgical abdomen. Patient states he will follow-up with his GI doctor. He states he did not follow up with GI initially because he could not remember the doctors name. After looking through his chart and letting him know who his GI physician was, he agrees to follow-up at the next available appointment. Patient informed to continue taking his Prilosec and rx for Carafate given. Symptomatic home care instructions and diet were addressed. Return  precautions were given and all questions were answered.  I personally performed the services described in this documentation, which was scribed in my presence. The recorded information has been reviewed and is accurate.    [facility] Pollyanna Levay, PA-C 03/15/16 1656  Jola Schmidt, MD 03/16/16 0800

## 2016-03-15 NOTE — Discharge Instructions (Signed)
Continue taking your Prilosec. I have given you a prescription for Carafate. Take this until you see your primary care provider or GI doctor. I would like you to see your GI doctor at the next available appointment, preferably within the week. Return to the ER for any new or worsening symptoms, any additional concerns.

## 2016-03-21 ENCOUNTER — Telehealth: Payer: Self-pay | Admitting: Internal Medicine

## 2016-03-22 NOTE — Telephone Encounter (Signed)
Pt scheduled to see Alonza Bogus PA 03/24/16@1 :30pm for follow-up from ER visit. Pt was seen for epigastric pain. Pt aware of appt.

## 2016-03-24 ENCOUNTER — Encounter: Payer: Self-pay | Admitting: Gastroenterology

## 2016-03-24 ENCOUNTER — Ambulatory Visit (INDEPENDENT_AMBULATORY_CARE_PROVIDER_SITE_OTHER): Payer: BLUE CROSS/BLUE SHIELD | Admitting: Gastroenterology

## 2016-03-24 VITALS — BP 112/66 | HR 68 | Ht 71.5 in | Wt 136.0 lb

## 2016-03-24 DIAGNOSIS — R1013 Epigastric pain: Secondary | ICD-10-CM | POA: Insufficient documentation

## 2016-03-24 DIAGNOSIS — R12 Heartburn: Secondary | ICD-10-CM | POA: Diagnosis not present

## 2016-03-24 NOTE — Patient Instructions (Signed)
Continue your Carafate.   If you you can tell that the carafate is not working as well, try to take Zantac or prilosec daily for 1-2 months.   Follow up as needed.

## 2016-03-24 NOTE — Progress Notes (Addendum)
     03/24/2016 Anthony Henderson [id number] Dec 28, 1980   History of Present Illness:  This is a 35 year old male who is known to Dr. Hilarie Fredrickson for evaluation of upper abdominal pain about a year and a half ago. At that time he underwent EGD in 11/2014, which revealed a small hiatal hernia but was otherwise normal.  GES, ultrasound, and HIDA scan were also normal.  He presents back to our office today again with complaints of epigastric abdominal pain and heartburn. He says that on April 11 he had a severe episode of heartburn that would not go away despite trying several home remedies. He went to the emergency department was given a GI cocktail, which allowed some improvement in his symptoms. He underwent another abdominal ultrasound of the right upper quadrant that was normal. He was prescribed Carafate 1 g tablet 4 times a day, but has only been taking one half tablet once or twice a day. He reports improvement with that. He says that he does not like taking medication. He previously had used Prilosec or Zantac sporadically, but nothing on a regular basis.  CBC, CMP, and lipase were all ok at the ED visit as well.  In addition he admits to intermittent nausea after eating; says that it feels like sometimes his food just does not sit right.  He will then have an episode of vomiting, which relieves his symptoms.  Denies weight loss.   Current Medications, Allergies, Past Medical History, Past Surgical History, Family History and Social History were reviewed in [employer] record.   Physical Exam: BP 112/66 mmHg  Pulse 68  Ht 5' 11.5" (1.816 m)  Wt 136 lb (61.689 kg)  BMI 18.71 kg/m2 General: Well developed black male in no acute distress Head: Normocephalic and atraumatic Eyes:  Sclerae anicteric, conjunctiva pink  Ears: Normal auditory acuity Lungs: Clear throughout to auscultation Heart: Regular rate and rhythm Abdomen: Soft, non-distended.  Normal bowel sounds.  Mild  epigastric TTP. Musculoskeletal: Symmetrical with no gross deformities  Extremities: No edema  Neurological: Alert oriented x 4, grossly non-focal Psychological:  Alert and cooperative. Normal mood and affect  Assessment and Recommendations: -Epigastric abdominal pain and heartburn:  Likely GERD related.  Had extensive evaluation for similar symptoms in the past.  This improved with GI cocktail and continues to improve on carafate 1 tablet daily.  He was not taking any type of acid medication on a regular basis. He will continue to use the Carafate for now, but advised him to begin taking Zantac 150 mg daily or Prilosec 20 mg daily for a few week course if symptoms continue to see if this helps.  He does not like taking medicine long-term, so I offered him to possibly take this for weeks at a time sporadically if he remains symptomatic in between. He will return for follow-up if symptoms continue or returned despite the above measures.  Addendum: Reviewed and agree with initial management. Jerene Bears, MD

## 2016-04-27 ENCOUNTER — Ambulatory Visit (HOSPITAL_COMMUNITY)
Admission: EM | Admit: 2016-04-27 | Discharge: 2016-04-27 | Disposition: A | Discharge status: Home / Self Care | Payer: BLUE CROSS/BLUE SHIELD | Attending: Family Medicine | Admitting: Family Medicine

## 2016-04-27 ENCOUNTER — Encounter (HOSPITAL_COMMUNITY): Payer: Self-pay | Admitting: *Deleted

## 2016-04-27 DIAGNOSIS — S0083XA Contusion of other part of head, initial encounter: Secondary | ICD-10-CM

## 2016-04-27 DIAGNOSIS — S0181XA Laceration without foreign body of other part of head, initial encounter: Secondary | ICD-10-CM

## 2016-04-27 NOTE — ED Provider Notes (Signed)
CSN: [id number]     Arrival date & time 04/27/16  1309 History   First MD Initiated Contact with Patient 04/27/16 1411     Chief Complaint  Patient presents with  . Facial Injury   (Consider location/radiation/quality/duration/timing/severity/associated sxs/prior Treatment) HPI Comments: 35 year old male was working with a drill and when reversing the drill and pulling it out from the floor it was forced against the right side of the face producing a 4 mm superficial laceration over the left  Zygoma. He denies injury, pain or problems with his left eye. Denies loss of consciousness, headache, dizziness or other symptoms. He denies facial pain or other injuries. He states he feels well and is asymptomatic except for the minor discomfort associated with a small cut.  Patient is a 35 y.o. male presenting with facial injury.  Facial Injury Associated symptoms: no congestion, no ear pain, no epistaxis and no headaches     Past Medical History  Diagnosis Date  . SVT (supraventricular tachycardia) ([place])     s/p ablation 03-10-14 by Dr Lovena Le  . Depression   . High risk sexual behavior   . Other malaise and fatigue   . Anxiety     "related to SVT"  . Panic attack   . Atrial fibrillation ([place])     following RFCA of SVT 03/2014 - on Flecainide  . Abdominal pain   . Post-operative nausea and vomiting   . Hiatal hernia   . GERD (gastroesophageal reflux disease)    Past Surgical History  Procedure Laterality Date  . Ablation  03/10/2014    RFCA of left posterior septal accessory pathyway by Dr Lovena Le with resultant atrial fibrillation  . Supraventricular tachycardia ablation N/A 03/10/2014    Procedure: SUPRAVENTRICULAR TACHYCARDIA ABLATION;  Surgeon: Evans Lance, MD;  Location: [facility] CATH LAB;  Service: Cardiovascular;  Laterality: N/A;   Family History  Problem Relation Age of Onset  . Diabetes Maternal Grandmother   . Colon cancer Neg Hx   . Esophageal cancer Neg Hx   . Rectal cancer Neg  Hx   . Stomach cancer Neg Hx    Social History  Substance Use Topics  . Smoking status: Former Smoker -- 0.25 packs/day for 10 years    Types: Cigarettes    Quit date: 02/03/2014  . Smokeless tobacco: Never Used  . Alcohol Use: No     Comment: 03/10/2014 "might have a few drinks 1-2 times/yr, if that"    Review of Systems  Constitutional: Negative.   HENT: Negative for congestion, dental problem, ear pain, facial swelling, mouth sores and nosebleeds.   Eyes: Negative.   Respiratory: Negative.   Genitourinary: Negative.   Musculoskeletal: Negative.   Skin: Positive for wound.  Neurological: Negative.  Negative for dizziness, tremors, seizures, syncope, facial asymmetry, speech difficulty, weakness, light-headedness, numbness and headaches.  Psychiatric/Behavioral: Negative.   All other systems reviewed and are negative.   Allergies  Penicillins  Home Medications   Prior to Admission medications   Medication Sig Start Date End Date Taking? Authorizing Provider  flecainide (TAMBOCOR) 100 MG tablet TAKE 1 TABLET BY MOUTH TWICE A DAY 08/11/15   Evans Lance, MD  promethazine (PHENERGAN) 25 MG tablet Take 1 tablet (25 mg total) by mouth every 6 (six) hours as needed for nausea or vomiting. 01/04/16   Orpah Greek, MD   Meds Ordered and Administered this Visit  Medications - No data to display  BP 122/78 mmHg  Pulse 100  Temp(Src) 98.6  F (37 C) (Oral)  Resp 16  SpO2 100% No data found.   Physical Exam  Constitutional: He is oriented to person, place, and time. He appears well-developed and well-nourished. No distress.  HENT:  Head: Normocephalic.  Mouth/Throat: Oropharynx is clear and moist. No oropharyngeal exudate.  No facial bone tenderness. No tenderness over the zygoma or orbits. No forehead or scalp tenderness. No tenderness to the mandible or TMJ. No facial swelling or discoloration.  Eyes: Conjunctivae and EOM are normal. Pupils are equal, round, and  reactive to light.  Neck: Normal range of motion. Neck supple.  Cardiovascular: Normal rate.   Pulmonary/Chest: Effort normal. No respiratory distress.  Musculoskeletal: He exhibits no edema.  Neurological: He is alert and oriented to person, place, and time. He has normal strength. No cranial nerve deficit or sensory deficit. He exhibits normal muscle tone. Coordination and gait normal. GCS eye subscore is 4. GCS verbal subscore is 5. GCS motor subscore is 6.  Skin: Skin is warm and dry. No erythema.  Approximately 4 mm superficial laceration of the dermis just over the left lateral zygoma. Currently no active bleeding.  Psychiatric: He has a normal mood and affect. His behavior is normal. Judgment and thought content normal.  Nursing note and vitals reviewed.   ED Course  .Marland KitchenLaceration Repair Date/Time: 04/27/2016 2:31 PM Performed by: Marcha Dutton, Dariella Gillihan Authorized by: Ihor Gully D Consent: Verbal consent obtained. Risks and benefits: risks, benefits and alternatives were discussed Consent given by: patient Patient understanding: patient states understanding of the procedure being performed Patient identity confirmed: verbally with patient Body area: head/neck Location details: left cheek Laceration length: 0.4 cm Foreign bodies: no foreign bodies Tendon involvement: none Nerve involvement: none Vascular damage: no Irrigation solution: saline Irrigation method: syringe Amount of cleaning: standard Debridement: none Degree of undermining: none Skin closure: glue Technique: simple Approximation: close Approximation difficulty: simple Patient tolerance: Patient tolerated the procedure well with no immediate complications   (including critical care time)  Labs Review Labs Reviewed - No data to display  Imaging Review No results found.   Visual Acuity Review  Right Eye Distance:   Left Eye Distance:   Bilateral Distance:    Right Eye Near:   Left Eye Near:    Bilateral  Near:         MDM   1. Laceration of face without complication, initial encounter   2. Contusion of face, initial encounter    Instructions on care of the glued wound. Instructions on facial injury and contusion. For any signs of infection recheck promptly. Tetanus toxoid up-to-date, received approximately 1 year ago. Recheck promptly for any new symptoms, problems. May return.    Janne Napoleon, NP 04/27/16 1434

## 2016-04-27 NOTE — ED Notes (Signed)
Patient states he was at work when a drill got stuck and in the motion of removing it he hit himself in the left side of the face with drill. Patient with small laceration under left eye. Bleeding controlled. No vision changes.

## 2016-04-27 NOTE — Discharge Instructions (Signed)
Facial Laceration A facial laceration is a cut on the face. These injuries can be painful and cause bleeding. Some cuts may need to be closed with stitches (sutures), skin adhesive strips, or wound glue. Cuts usually heal quickly but can leave a scar. It can take 1-2 years for the scar to go away completely. HOME CARE   Only take medicines as told by your doctor.  Follow your doctor's instructions for wound care. For Stitches:  Keep the cut clean and dry.  If you have a bandage (dressing), change it at least once a day. Change the bandage if it gets wet or dirty, or as told by your doctor.  Wash the cut with soap and water 2 times a day. Rinse the cut with water. Pat it dry with a clean towel.  Put a thin layer of medicated cream on the cut as told by your doctor.  You may shower after the first 24 hours. Do not soak the cut in water until the stitches are removed.  Have your stitches removed as told by your doctor.  Do not wear any makeup until a few days after your stitches are removed. For Skin Adhesive Strips:  Keep the cut clean and dry.  Do not get the strips wet. You may take a bath, but be careful to keep the cut dry.  If the cut gets wet, pat it dry with a clean towel.  The strips will fall off on their own. Do not remove the strips that are still stuck to the cut. For Wound Glue:  You may shower or take baths. Do not soak or scrub the cut. Do not swim. Avoid heavy sweating until the glue falls off on its own. After a shower or bath, pat the cut dry with a clean towel.  Do not put medicine or makeup on your cut until the glue falls off.  If you have a bandage, do not put tape over the glue.  Avoid lots of sunlight or tanning lamps until the glue falls off.  The glue will fall off on its own in 5-10 days. Do not pick at the glue. After Healing:  Put sunscreen on the cut for the first year to reduce your scar. GET HELP IF:  You have a fever. GET HELP RIGHT AWAY  IF:   Your cut area gets red, painful, or puffy (swollen).  You see a yellowish-white fluid (pus) coming from the cut.   This information is not intended to replace advice given to you by your health care provider. Make sure you discuss any questions you have with your health care provider.   Document Released: 05/09/2008 Document Revised: 12/12/2014 Document Reviewed: 07/04/2013 Elsevier Interactive Patient Education 2016 [place].  Facial or Scalp Contusion  A facial or scalp contusion is a deep bruise on the face or head. Contusions happen when an injury causes bleeding under the skin. Signs of bruising include pain, puffiness (swelling), and discolored skin. The contusion may turn blue, purple, or yellow. HOME CARE  Only take medicines as told by your doctor.  Put ice on the injured area.  Put ice in a plastic bag.  Place a towel between your skin and the bag.  Leave the ice on for 20 minutes, 2-3 times a day. GET HELP IF:  You have bite problems.  You have pain when chewing.  You are worried about your face not healing normally. GET HELP RIGHT AWAY IF:   You have severe pain or  a headache and medicine does not help.  You are very tired or confused, or your personality changes.  You throw up (vomit).  You have a nosebleed that will not stop.  You see two of everything (double vision) or have blurry vision.  You have fluid coming from your nose or ear.  You have problems walking or using your arms or legs. MAKE SURE YOU:   Understand these instructions.  Will watch your condition.  Will get help right away if you are not doing well or get worse.   This information is not intended to replace advice given to you by your health care provider. Make sure you discuss any questions you have with your health care provider.   Document Released: 11/10/2011 Document Revised: 12/12/2014 Document Reviewed: 07/04/2013 Elsevier Interactive Patient Education 2016  [place], Adult A laceration is a cut that goes through all layers of the skin. The cut also goes into the tissue that is right under the skin. Some cuts heal on their own. Others need to be closed with stitches (sutures), staples, skin adhesive strips, or wound glue. Taking care of your cut lowers your risk of infection and helps your cut to heal better. HOW TO TAKE CARE OF YOUR CUT For stitches or staples:  Keep the wound clean and dry.  If you were given a bandage (dressing), you should change it at least one time per day or as told by your doctor. You should also change it if it gets wet or dirty.  Keep the wound completely dry for the first 24 hours or as told by your doctor. After that time, you may take a shower or a bath. However, make sure that the wound is not soaked in water until after the stitches or staples have been removed.  Clean the wound one time each day or as told by your doctor:  Wash the wound with soap and water.  Rinse the wound with water until all of the soap comes off.  Pat the wound dry with a clean towel. Do not rub the wound.  After you clean the wound, put a thin layer of antibiotic ointment on it as told by your doctor. This ointment:  Helps to prevent infection.  Keeps the bandage from sticking to the wound.  Have your stitches or staples removed as told by your doctor. If your doctor used skin adhesive strips:   Keep the wound clean and dry.  If you were given a bandage, you should change it at least one time per day or as told by your doctor. You should also change it if it gets dirty or wet.  Do not get the skin adhesive strips wet. You can take a shower or a bath, but be careful to keep the wound dry.  If the wound gets wet, pat it dry with a clean towel. Do not rub the wound.  Skin adhesive strips fall off on their own. You can trim the strips as the wound heals. Do not remove any strips that are still stuck to the  wound. They will fall off after a while. If your doctor used wound glue:  Try to keep your wound dry, but you may briefly wet it in the shower or bath. Do not soak the wound in water, such as by swimming.  After you take a shower or a bath, gently pat the wound dry with a clean towel. Do not rub the wound.  Do not  do any activities that will make you really sweaty until the skin glue has fallen off on its own.  Do not apply liquid, cream, or ointment medicine to your wound while the skin glue is still on.  If you were given a bandage, you should change it at least one time per day or as told by your doctor. You should also change it if it gets dirty or wet.  If a bandage is placed over the wound, do not let the tape for the bandage touch the skin glue.  Do not pick at the glue. The skin glue usually stays on for 5-10 days. Then, it falls off of the skin. General Instructions  To help prevent scarring, make sure to cover your wound with sunscreen whenever you are outside after stitches are removed, after adhesive strips are removed, or when wound glue stays in place and the wound is healed. Make sure to wear a sunscreen of at least 30 SPF.  Take over-the-counter and prescription medicines only as told by your doctor.  If you were given antibiotic medicine or ointment, take or apply it as told by your doctor. Do not stop using the antibiotic even if your wound is getting better.  Do not scratch or pick at the wound.  Keep all follow-up visits as told by your doctor. This is important.  Check your wound every day for signs of infection. Watch for:  Redness, swelling, or pain.  Fluid, blood, or pus.  Raise (elevate) the injured area above the level of your heart while you are sitting or lying down, if possible. GET HELP IF:  You got a tetanus shot and you have any of these problems at the injection site:  Swelling.  Very bad pain.  Redness.  Bleeding.  You have a  fever.  A wound that was closed breaks open.  You notice a bad smell coming from your wound or your bandage.  You notice something coming out of the wound, such as wood or glass.  Medicine does not help your pain.  You have more redness, swelling, or pain at the site of your wound.  You have fluid, blood, or pus coming from your wound.  You notice a change in the color of your skin near your wound.  You need to change the bandage often because fluid, blood, or pus is coming from the wound.  You start to have a new rash.  You start to have numbness around the wound. GET HELP RIGHT AWAY IF:  You have very bad swelling around the wound.  Your pain suddenly gets worse and is very bad.  You notice painful lumps near the wound or on skin that is anywhere on your body.  You have a red streak going away from your wound.  The wound is on your hand or foot and you cannot move a finger or toe like you usually can.  The wound is on your hand or foot and you notice that your fingers or toes look pale or bluish.   This information is not intended to replace advice given to you by your health care provider. Make sure you discuss any questions you have with your health care provider.   Document Released: 05/09/2008 Document Revised: 04/07/2015 Document Reviewed: 11/17/2014 Elsevier Interactive Patient Education [employer].

## 2016-05-20 ENCOUNTER — Other Ambulatory Visit: Payer: Self-pay | Admitting: *Deleted

## 2016-05-20 MED ORDER — FLECAINIDE ACETATE 100 MG PO TABS: 100.0000 mg | ORAL_TABLET | Freq: Two times a day (BID) | ORAL | Status: DC

## 2016-06-20 ENCOUNTER — Ambulatory Visit: Payer: Self-pay | Admitting: Internal Medicine

## 2016-06-20 ENCOUNTER — Other Ambulatory Visit: Payer: Self-pay

## 2016-07-17 ENCOUNTER — Other Ambulatory Visit: Payer: Self-pay | Admitting: Internal Medicine

## 2016-07-19 ENCOUNTER — Other Ambulatory Visit: Payer: Self-pay | Admitting: *Deleted

## 2016-07-19 MED ORDER — FLECAINIDE ACETATE 100 MG PO TABS: 100.0000 mg | ORAL_TABLET | Freq: Two times a day (BID) | ORAL | 11 refills | Status: DC

## 2016-07-20 ENCOUNTER — Ambulatory Visit (INDEPENDENT_AMBULATORY_CARE_PROVIDER_SITE_OTHER): Payer: BLUE CROSS/BLUE SHIELD | Admitting: Internal Medicine

## 2016-07-20 ENCOUNTER — Encounter: Payer: Self-pay | Admitting: Internal Medicine

## 2016-07-20 VITALS — BP 198/70 | HR 63 | Ht 72.0 in | Wt 134.2 lb

## 2016-07-20 DIAGNOSIS — I471 Supraventricular tachycardia: Secondary | ICD-10-CM | POA: Diagnosis not present

## 2016-07-20 MED ORDER — FLECAINIDE ACETATE 100 MG PO TABS: 100.0000 mg | ORAL_TABLET | Freq: Two times a day (BID) | ORAL | 12 refills | Status: DC

## 2016-07-20 NOTE — Progress Notes (Signed)
HPI Anthony Henderson returns today for followup. He is a pleasant 35 yo man with a h/o SVT who underwent catheter ablation over 2 years ago. He had a very difficult posteroseptal AP. During ablation, his ablation had to be carried out inside the CS and he then developed atrial fib and no additional ablation could be carried out. He has had 2 episode of recurrent palpitations. We prescribed flecainide but he has only taken 50 mg a day, 6 days a week. His episodes of palpitations have been terminated with vagal maneuvers. He feels well and has finally gotten used to taking very low dose flecainide.  Allergies  Allergen Reactions  . Penicillins Hives     Current Outpatient Prescriptions  Medication Sig Dispense Refill  . flecainide (TAMBOCOR) 100 MG tablet Take 1 tablet (100 mg total) by mouth 2 (two) times daily. 60 tablet 11  . promethazine (PHENERGAN) 25 MG tablet Take 1 tablet (25 mg total) by mouth every 6 (six) hours as needed for nausea or vomiting. 30 tablet 0   No current facility-administered medications for this visit.      Past Medical History:  Diagnosis Date  . Abdominal pain   . Anxiety    "related to SVT"  . Atrial fibrillation ([place])    following RFCA of SVT 03/2014 - on Flecainide  . Depression   . GERD (gastroesophageal reflux disease)   . Hiatal hernia   . High risk sexual behavior   . Other malaise and fatigue   . Panic attack   . Post-operative nausea and vomiting   . SVT (supraventricular tachycardia) ([place])    s/p ablation 03-10-14 by Dr Lovena Le    ROS:   All systems reviewed and negative except as noted in the HPI.   Past Surgical History:  Procedure Laterality Date  . ABLATION  03/10/2014   RFCA of left posterior septal accessory pathyway by Dr Lovena Le with resultant atrial fibrillation  . SUPRAVENTRICULAR TACHYCARDIA ABLATION N/A 03/10/2014   Procedure: SUPRAVENTRICULAR TACHYCARDIA ABLATION;  Surgeon: Evans Lance, MD;  Location: [facility] CATH LAB;   Service: Cardiovascular;  Laterality: N/A;     Family History  Problem Relation Age of Onset  . Diabetes Maternal Grandmother   . Colon cancer Neg Hx   . Esophageal cancer Neg Hx   . Rectal cancer Neg Hx   . Stomach cancer Neg Hx      Social History   Social History  . Marital status: Married    Spouse name: N/A  . Number of children: 3  . Years of education: N/A   Occupational History  . [employer] seismologist    Social History Main Topics  . Smoking status: Former Smoker    Packs/day: 0.25    Years: 10.00    Types: Cigarettes    Quit date: 02/03/2014  . Smokeless tobacco: Never Used  . Alcohol use No     Comment: 03/10/2014 "might have a few drinks 1-2 times/yr, if that"  . Drug use: No  . Sexual activity: Yes   Other Topics Concern  . Not on file   Social History Narrative  . No narrative on file     BP (!) 198/70   Pulse 63   Ht 6' (1.829 m)   Wt 134 lb 3.2 oz (60.9 kg)   BMI 18.20 kg/m  BP -108/72 by me, suspect typo above. Physical Exam:  Well appearing 35 yo man, NAD HEENT: Unremarkable Neck:  6  cm JVD, no thyromegally Back:  No CVA tenderness Lungs:  Clear with no wheezes HEART:  Regular rate rhythm, no murmurs, no rubs, no clicks Abd:  soft, positive bowel sounds, no organomegally, no rebound, no guarding Ext:  2 plus pulses, no edema, no cyanosis, no clubbing Skin:  No rashes no nodules Neuro:  CN II through XII intact, motor grossly intact  ECG - NSR  Assess/Plan: 1. Palpitations - his symptoms are well controlled and he has no episode of AP conduction. Will follow. 2. SVT - no recurrence on very low dose flecainide. I offered having him stop his flecainide but he would like to continue. I will see him back in 2 years.  Anthony Henderson.D.

## 2016-07-20 NOTE — Patient Instructions (Signed)
Medication Instructions: Your physician recommends that you continue on your current medications as directed. Please refer to the Current Medication list given to you today.   Labwork: NONE ORDERED  Procedures/Testing: NONE ORDERED  Follow-Up: Your physician wants you to follow-up in 2 YEARS with Dr. Lovena Le. You will receive a reminder letter in the mail two months in advance. If you don't receive a letter, please call our office to schedule the follow-up appointment.   Any Additional Special Instructions Will Be Listed Below (If Applicable).     If you need a refill on your cardiac medications before your next appointment, please call your pharmacy.

## 2016-09-06 ENCOUNTER — Telehealth: Payer: Self-pay | Admitting: Gastroenterology

## 2016-09-06 ENCOUNTER — Other Ambulatory Visit: Payer: Self-pay | Admitting: *Deleted

## 2016-09-06 MED ORDER — SUCRALFATE 1 G PO TABS: ORAL_TABLET | ORAL | 1 refills | Status: DC

## 2016-09-06 NOTE — Telephone Encounter (Signed)
He reported it was working at the time of his visit.  If it helps then ok to refill.  Thank you,  Jess

## 2017-02-26 ENCOUNTER — Encounter (HOSPITAL_COMMUNITY): Payer: Self-pay

## 2017-02-26 ENCOUNTER — Emergency Department (HOSPITAL_COMMUNITY)
Admission: EM | Admit: 2017-02-26 | Discharge: 2017-02-27 | Disposition: A | Discharge status: Home / Self Care | Payer: BLUE CROSS/BLUE SHIELD | Attending: Emergency Medicine | Admitting: Emergency Medicine

## 2017-02-26 DIAGNOSIS — Z87891 Personal history of nicotine dependence: Secondary | ICD-10-CM | POA: Insufficient documentation

## 2017-02-26 DIAGNOSIS — R1013 Epigastric pain: Secondary | ICD-10-CM

## 2017-02-26 DIAGNOSIS — R112 Nausea with vomiting, unspecified: Secondary | ICD-10-CM | POA: Insufficient documentation

## 2017-02-26 LAB — I-STAT CG4 LACTIC ACID, ED: LACTIC ACID, VENOUS: 1.6 mmol/L (ref 0.5–1.9)

## 2017-02-26 MED ORDER — FENTANYL CITRATE (PF) 100 MCG/2ML IJ SOLN
50.0000 ug | Freq: Once | INTRAMUSCULAR | Status: AC
  Administered 2017-02-26: 50 ug via INTRAVENOUS
  Filled 2017-02-26: qty 2

## 2017-02-26 MED ORDER — METOCLOPRAMIDE HCL 5 MG/ML IJ SOLN
10.0000 mg | Freq: Once | INTRAMUSCULAR | Status: AC
  Administered 2017-02-26: 10 mg via INTRAVENOUS
  Filled 2017-02-26: qty 2

## 2017-02-26 MED ORDER — SODIUM CHLORIDE 0.9 % IV BOLUS (SEPSIS)
1000.0000 mL | Freq: Once | INTRAVENOUS | Status: AC
  Administered 2017-02-26: 1000 mL via INTRAVENOUS

## 2017-02-26 NOTE — ED Triage Notes (Signed)
Pt arrived via [facility] from home for N/V/D starting at 10am, vomited 4x today.  Pt took PO phenergan x2 and PO Zofran x1 today.  [facility] gave 4mg  IV Zofran pt has not vomited with them. Pt ambulatory on arrival.

## 2017-02-26 NOTE — ED Provider Notes (Signed)
[facility] DEPT Provider Note   CSN: [id number] Arrival date & time: 02/26/17  2238     History   Chief Complaint Chief Complaint  Patient presents with  . Nausea  . Emesis    HPI Anthony Henderson is a 36 y.o. male with a hx of SVT (s/p ablation 4-6/15 by Dr. Lovena Le), hiatal hernia, GERD presents to the Emergency Department complaining of gradual, persistent, progressively worsening epigastric abd pain onset 11am today.  Associated symptoms include watery diarrhea and chills.  Denies melena, hematochezia.  Pt has taken zofran and phenergan at home without relief.  Nothing makes it better and nothing makes it worse.  Pt denies measured fever, headache, chest pain, hematochezia, leg swelling, rash.  Pt denies abd surgeries, recent antibiotics or recent travel.    Record review shows that patient has been seen numerous times in the emergency department for epigastric and generalized abdominal pain with vomiting. He is followed by a [facility].  Last visit to gastroenterology was 03/24/16 after being seen in the emergency department. At that time it was recommended that he begin taking omeprazole or Zantac daily. At this time he is not doing this.  His previous workup has included EGD in 2015 which showed small hiatal hernia was otherwise normal; GES ultrasound and HIDA scan which were normal.   The history is provided by the patient and medical records. No language interpreter was used.    Past Medical History:  Diagnosis Date  . Abdominal pain   . Anxiety    "related to SVT"  . Atrial fibrillation ([place])    following RFCA of SVT 03/2014 - on Flecainide  . Depression   . GERD (gastroesophageal reflux disease)   . Hiatal hernia   . High risk sexual behavior   . Other malaise and fatigue   . Panic attack   . Post-operative nausea and vomiting   . SVT (supraventricular tachycardia) [facility])    s/p ablation 03-10-14 by Dr Lovena Le    Patient Active Problem List   Diagnosis Date Noted  . Abdominal pain, epigastric 03/24/2016  . Pyrosis 03/24/2016  . AP (abdominal pain) 10/21/2014  . Nausea with vomiting 10/21/2014  . Esophageal reflux 08/27/2014  . Paroxysmal supraventricular tachycardia ([place]) 03/10/2014  . PSVT (paroxysmal supraventricular tachycardia) ([place]) 02/07/2014  . SVT (supraventricular tachycardia) ([place]) 02/03/2014  . Anxiety and depression 05/10/2013    Past Surgical History:  Procedure Laterality Date  . ABLATION  03/10/2014   RFCA of left posterior septal accessory pathyway by Dr Lovena Le with resultant atrial fibrillation  . SUPRAVENTRICULAR TACHYCARDIA ABLATION N/A 03/10/2014   Procedure: SUPRAVENTRICULAR TACHYCARDIA ABLATION;  Surgeon: Evans Lance, MD;  Location: [facility] CATH LAB;  Service: Cardiovascular;  Laterality: N/A;       Home Medications    Prior to Admission medications   Medication Sig Start Date End Date Taking? Authorizing Provider  ondansetron (ZOFRAN ODT) 4 MG disintegrating tablet 4mg  ODT q4 hours prn nausea/vomit 02/27/17   Kollyn Lingafelter, PA-C  ranitidine (ZANTAC) 150 MG tablet Take 1 tablet (150 mg total) by mouth 2 (two) times daily. 02/27/17   Bridgid Printz, PA-C  sucralfate (CARAFATE) 1 g tablet Take 1 tab with meals and at bedtime. 02/27/17   Jarrett Soho Donielle Kaigler, PA-C    Family History Family History  Problem Relation Age of Onset  . Diabetes Maternal Grandmother   . Colon cancer Neg Hx   . Esophageal cancer Neg Hx   . Rectal cancer Neg Hx   .  Stomach cancer Neg Hx     Social History Social History  Substance Use Topics  . Smoking status: Former Smoker    Packs/day: 0.25    Years: 10.00    Types: Cigarettes    Quit date: 02/03/2014  . Smokeless tobacco: Never Used  . Alcohol use No     Comment: 03/10/2014 "might have a few drinks 1-2 times/yr, if that"     Allergies   Penicillins   Review of Systems Review of Systems  Constitutional: Positive for chills.  Gastrointestinal:  Positive for abdominal pain, nausea and vomiting.  All other systems reviewed and are negative.    Physical Exam Updated Vital Signs BP 113/77   Pulse 76   Temp 98.6 F (37 C) (Oral)   Resp 18   Ht 6' (1.829 m)   Wt 63.5 kg   SpO2 100%   BMI 18.99 kg/m   Physical Exam  Constitutional: He appears well-developed and well-nourished. He appears distressed.  Awake, alert,  Rigors  HENT:  Head: Normocephalic and atraumatic.  Mouth/Throat: Oropharynx is clear and moist. No oropharyngeal exudate.  Eyes: Conjunctivae are normal. No scleral icterus.  Neck: Normal range of motion. Neck supple.  Cardiovascular: Regular rhythm and intact distal pulses.  Tachycardia present.   Pulmonary/Chest: Effort normal and breath sounds normal. No respiratory distress. He has no wheezes.  Equal chest expansion  Abdominal: Soft. Bowel sounds are normal. He exhibits no mass. There is generalized tenderness ( mild without guarding) and tenderness in the right upper quadrant. There is no rebound and no guarding.  Musculoskeletal: Normal range of motion. He exhibits no edema.  Neurological: He is alert.  Speech is clear and goal oriented Moves extremities without ataxia  Skin: Skin is warm and dry. He is not diaphoretic.  Psychiatric: He has a normal mood and affect.  Nursing note and vitals reviewed.    ED Treatments / Results  Labs (all labs ordered are listed, but only abnormal results are displayed) Labs Reviewed  CBC WITH DIFFERENTIAL/PLATELET - Abnormal; Notable for the following:       Result Value   Lymphs Abs 0.5 (*)    All other components within normal limits  COMPREHENSIVE METABOLIC PANEL - Abnormal; Notable for the following:    Chloride 100 (*)    Glucose, Bld 125 (*)    ALT 14 (*)    Alkaline Phosphatase 37 (*)    Total Bilirubin 1.5 (*)    All other components within normal limits  URINALYSIS, ROUTINE W REFLEX MICROSCOPIC - Abnormal; Notable for the following:    Ketones, ur  20 (*)    Protein, ur 30 (*)    All other components within normal limits  LIPASE, BLOOD  I-STAT CG4 LACTIC ACID, ED    Procedures Procedures (including critical care time)  Medications Ordered in ED Medications  sodium chloride 0.9 % bolus 1,000 mL (0 mLs Intravenous Stopped 02/27/17 0103)  fentaNYL (SUBLIMAZE) injection 50 mcg (50 mcg Intravenous Given 02/26/17 2344)  metoCLOPramide (REGLAN) injection 10 mg (10 mg Intravenous Given 02/26/17 2344)  pantoprazole (PROTONIX) injection 40 mg (40 mg Intravenous Given 02/27/17 0102)  sodium chloride 0.9 % bolus 500 mL (0 mLs Intravenous Stopped 02/27/17 0255)     Initial Impression / Assessment and Plan / ED Course  I have reviewed the triage vital signs and the nursing notes.  Pertinent labs & imaging results that were available during my care of the patient were reviewed by me and considered in  my medical decision making (see chart for details).  Clinical Course as of Feb 27 254  Mon Feb 27, 2017  0158 Pt resting comfortably; No additional emesis in the ED.   [HM]  [id number] Patient tolerating by mouth. Urinalysis with some persisting ketones. Additional fluid given.  [HM]    Clinical Course User Index [HM] Jarrett Soho Kamyrah Feeser, PA-C    Patient is nontoxic, nonseptic appearing, in no apparent distress.  Patient's pain and other symptoms adequately managed in emergency department.  Fluid bolus given.  Labs and vitals reviewed.  Patient does not meet the SIRS or Sepsis criteria.  On repeat exam patient does not have a surgical abdomin and there are no peritoneal signs.  No imaging indicated at this time. Patient is afebrile on a rectal temperature and without leukocytosis. No evidence of infection. No indication of appendicitis, bowel obstruction, bowel perforation, cholecystitis, diverticulitis.  Patient discharged home with symptomatic treatment and given strict instructions for follow-up with their gastroenterologist.  I have also discussed  reasons to return immediately to the ER.  Patient expresses understanding and agrees with plan.     Final Clinical Impressions(s) / ED Diagnoses   Final diagnoses:  Non-intractable vomiting with nausea, unspecified vomiting type  Epigastric abdominal pain    New Prescriptions New Prescriptions   ONDANSETRON (ZOFRAN ODT) 4 MG DISINTEGRATING TABLET    4mg  ODT q4 hours prn nausea/vomit   RANITIDINE (ZANTAC) 150 MG TABLET    Take 1 tablet (150 mg total) by mouth 2 (two) times daily.     Jarrett Soho Gerson Fauth, PA-C 02/27/17 0051    Ripley Fraise, MD 02/28/17 [phone]

## 2017-02-27 LAB — URINALYSIS, ROUTINE W REFLEX MICROSCOPIC
Bacteria, UA: NONE SEEN
Bilirubin Urine: NEGATIVE
Glucose, UA: NEGATIVE mg/dL
Hgb urine dipstick: NEGATIVE
Ketones, ur: 20 mg/dL — AB
LEUKOCYTES UA: NEGATIVE
Nitrite: NEGATIVE
PH: 8 (ref 5.0–8.0)
Protein, ur: 30 mg/dL — AB
SPECIFIC GRAVITY, URINE: 1.03 (ref 1.005–1.030)
SQUAMOUS EPITHELIAL / LPF: NONE SEEN

## 2017-02-27 LAB — CBC WITH DIFFERENTIAL/PLATELET
BASOS ABS: 0 10*3/uL (ref 0.0–0.1)
Basophils Relative: 0 %
Eosinophils Absolute: 0 10*3/uL (ref 0.0–0.7)
Eosinophils Relative: 0 %
HCT: 43.1 % (ref 39.0–52.0)
Hemoglobin: 14.9 g/dL (ref 13.0–17.0)
Lymphocytes Relative: 7 %
Lymphs Abs: 0.5 10*3/uL — ABNORMAL LOW (ref 0.7–4.0)
MCH: 30.8 pg (ref 26.0–34.0)
MCHC: 34.6 g/dL (ref 30.0–36.0)
MCV: 89 fL (ref 78.0–100.0)
Monocytes Absolute: 0.5 10*3/uL (ref 0.1–1.0)
Monocytes Relative: 8 %
NEUTROS PCT: 85 %
Neutro Abs: 5.9 10*3/uL (ref 1.7–7.7)
Platelets: 240 10*3/uL (ref 150–400)
RBC: 4.84 MIL/uL (ref 4.22–5.81)
RDW: 11.8 % (ref 11.5–15.5)
WBC: 6.8 10*3/uL (ref 4.0–10.5)

## 2017-02-27 LAB — COMPREHENSIVE METABOLIC PANEL
ALT: 14 U/L — ABNORMAL LOW (ref 17–63)
AST: 28 U/L (ref 15–41)
Albumin: 4.4 g/dL (ref 3.5–5.0)
Alkaline Phosphatase: 37 U/L — ABNORMAL LOW (ref 38–126)
Anion gap: 11 (ref 5–15)
BUN: 12 mg/dL (ref 6–20)
CO2: 25 mmol/L (ref 22–32)
CREATININE: 1.19 mg/dL (ref 0.61–1.24)
Calcium: 9.3 mg/dL (ref 8.9–10.3)
Chloride: 100 mmol/L — ABNORMAL LOW (ref 101–111)
GFR calc Af Amer: >60 mL/min (ref >60)
GFR calc non Af Amer: >60 mL/min (ref >60)
Glucose, Bld: 125 mg/dL — ABNORMAL HIGH (ref 65–99)
POTASSIUM: 4.2 mmol/L (ref 3.5–5.1)
Sodium: 136 mmol/L (ref 135–145)
TOTAL PROTEIN: 6.9 g/dL (ref 6.5–8.1)
Total Bilirubin: 1.5 mg/dL — ABNORMAL HIGH (ref 0.3–1.2)

## 2017-02-27 LAB — LIPASE, BLOOD: Lipase: 11 U/L (ref 11–51)

## 2017-02-27 MED ORDER — SUCRALFATE 1 G PO TABS: ORAL_TABLET | ORAL | 1 refills | Status: DC

## 2017-02-27 MED ORDER — PANTOPRAZOLE SODIUM 40 MG IV SOLR
40.0000 mg | Freq: Once | INTRAVENOUS | Status: AC
  Administered 2017-02-27: 40 mg via INTRAVENOUS
  Filled 2017-02-27: qty 40

## 2017-02-27 MED ORDER — RANITIDINE HCL 150 MG PO TABS: 150.0000 mg | ORAL_TABLET | Freq: Two times a day (BID) | ORAL | 0 refills | Status: DC

## 2017-02-27 MED ORDER — SODIUM CHLORIDE 0.9 % IV BOLUS (SEPSIS)
500.0000 mL | Freq: Once | INTRAVENOUS | Status: AC
  Administered 2017-02-27: 500 mL via INTRAVENOUS

## 2017-02-27 MED ORDER — ONDANSETRON 4 MG PO TBDP: ORAL_TABLET | ORAL | 0 refills | Status: DC

## 2017-02-27 NOTE — ED Notes (Signed)
Pt drinking water at bedside

## 2017-02-27 NOTE — Discharge Instructions (Signed)
1. Medications: Zantac, zofran, carafate, usual home medications 2. Treatment: rest, drink plenty of fluids, advance diet slowly 3. Follow Up: Please followup with your primary doctor in 2 days for discussion of your diagnoses and further evaluation after today's visit; if you do not have a primary care doctor use the resource guide provided to find one; Please return to the ER for persistent vomiting, high fevers or worsening symptoms

## 2017-02-27 NOTE — ED Notes (Signed)
Pt stable, ambulatory, states understanding of discharge instructions 

## 2017-04-11 ENCOUNTER — Ambulatory Visit (INDEPENDENT_AMBULATORY_CARE_PROVIDER_SITE_OTHER): Payer: Commercial Indemnity | Admitting: Gastroenterology

## 2017-04-11 ENCOUNTER — Encounter: Payer: Self-pay | Admitting: Gastroenterology

## 2017-04-11 ENCOUNTER — Other Ambulatory Visit (INDEPENDENT_AMBULATORY_CARE_PROVIDER_SITE_OTHER): Payer: Commercial Indemnity

## 2017-04-11 VITALS — BP 110/68 | HR 74 | Ht 72.0 in | Wt 135.5 lb

## 2017-04-11 DIAGNOSIS — R112 Nausea with vomiting, unspecified: Secondary | ICD-10-CM

## 2017-04-11 DIAGNOSIS — R1013 Epigastric pain: Secondary | ICD-10-CM

## 2017-04-11 LAB — HEMOGLOBIN A1C: HEMOGLOBIN A1C: 6.4 % (ref 4.6–6.5)

## 2017-04-11 NOTE — Progress Notes (Addendum)
04/11/2017 Anthony Henderson [id number] Sep 01, 1981   HISTORY OF PRESENT ILLNESS:  This is a 36 year old male who has been seen here on 2 other occasions for complaints of recurrent episodes of nausea, vomiting, and epigastric abdominal pain. This has been occurring since at least the fall of 2015 when he was first seen here. He has undergone 2 ultrasounds, EGD, HIDA scan, gastric emptying scan, which have all been unremarkable. He continues to complain of episodic nausea and vomiting. He says it is very random and inconsistent with the vomiting episodes. They typically occur 20-30 minutes after a meal. The only other new symptom is report of some very dark stools about 2 weeks ago. He says that this occurred for 3-4 days in a row and then resolves. He denies any NSAID use. He denies smoking marijuana. Says that he stop smoking cigarettes and marijuana in March 2014. Uses Zofran or Phenergan for the nausea, but thinks the Phenergan helps more although it does make him sleepy so he tries not to take it. He says the pantoprazole made him sick. Using Prilosec or ranitidine on rare occasion but does not want to become "dependent" on them.   Past Medical History:  Diagnosis Date  . Abdominal pain   . Anxiety    "related to SVT"  . Atrial fibrillation ([place])    following RFCA of SVT 03/2014 - on Flecainide  . Depression   . GERD (gastroesophageal reflux disease)   . Hiatal hernia   . High risk sexual behavior   . Other malaise and fatigue   . Panic attack   . Post-operative nausea and vomiting   . SVT (supraventricular tachycardia) [facility])    s/p ablation 03-10-14 by Dr Lovena Le   Past Surgical History:  Procedure Laterality Date  . ABLATION  03/10/2014   RFCA of left posterior septal accessory pathyway by Dr Lovena Le with resultant atrial fibrillation  . SUPRAVENTRICULAR TACHYCARDIA ABLATION N/A 03/10/2014   Procedure: SUPRAVENTRICULAR TACHYCARDIA ABLATION;  Surgeon: Evans Lance, MD;  Location: [facility]  CATH LAB;  Service: Cardiovascular;  Laterality: N/A;    reports that he quit smoking about 3 years ago. His smoking use included Cigarettes. He has a 2.50 pack-year smoking history. He has never used smokeless tobacco. He reports that he does not drink alcohol or use drugs. family history includes Diabetes in his maternal grandmother. Allergies  Allergen Reactions  . Penicillins Hives      Outpatient Encounter Prescriptions as of 04/11/2017  Medication Sig  . pantoprazole (PROTONIX) 20 MG tablet Take 20 mg by mouth daily.  . promethazine (PHENERGAN) 12.5 MG tablet Take 12.5 mg by mouth every 6 (six) hours as needed for nausea or vomiting.  . ondansetron (ZOFRAN ODT) 4 MG disintegrating tablet 4mg  ODT q4 hours prn nausea/vomit (Patient not taking: Reported on 04/11/2017)  . ranitidine (ZANTAC) 150 MG tablet Take 1 tablet (150 mg total) by mouth 2 (two) times daily. (Patient not taking: Reported on 04/11/2017)  . sucralfate (CARAFATE) 1 g tablet Take 1 tab with meals and at bedtime. (Patient not taking: Reported on 04/11/2017)   No facility-administered encounter medications on file as of 04/11/2017.      REVIEW OF SYSTEMS  : All other systems reviewed and negative except where noted in the History of Present Illness.   PHYSICAL EXAM: BP 110/68   Pulse 74   Ht 6' (1.829 m)   Wt 135 lb 8 oz (61.5 kg)   BMI 18.38 kg/m  General:  Well developed black male in no acute distress Head: Normocephalic and atraumatic Eyes:  Sclerae anicteric, conjunctiva pink. Ears: Normal auditory acuity Lungs: Clear throughout to auscultation; no increased WOB. Heart: Regular rate and rhythm Abdomen: Soft, non-distended. Normal bowel sounds.  Mild epigastric TTP. Musculoskeletal: Symmetrical with no gross deformities  Skin: No lesions on visible extremities Extremities: No edema  Neurological: Alert oriented x 4, grossly non-focal Psychological:  Alert and cooperative. Normal mood and affect  ASSESSMENT AND  PLAN: -Recurrent nausea and vomiting with some epigastric pain:  Extensive evaluation at this point has been negative.  Will order CT scan as this is the only thing from a GI standpoint that has not been performed.  If negative I have asked him to go on his omeprazole daily for about 4 weeks to see if this allows any improvement in his symptoms. If that is no help then may need to consider evaluating other non-GI causes of nausea and vomiting. ? Head CT.   CC:  No ref. provider found  Addendum: Reviewed and agree with management. Pyrtle, Lajuan Lines, MD

## 2017-04-11 NOTE — Patient Instructions (Signed)
Your physician has requested that you go to the basement for lab work before leaving today.  You have been scheduled for a CT scan of the abdomen and pelvis at [facility] ([address]---this is in the same building as [facility]).   You are scheduled on 04-18-17 at 2 pm. You should arrive 15 minutes prior to your appointment time for registration. Please follow the written instructions below on the day of your exam:  WARNING: IF YOU ARE ALLERGIC TO IODINE/X-RAY DYE, PLEASE NOTIFY RADIOLOGY IMMEDIATELY AT [phone]! YOU WILL BE GIVEN A 13 HOUR PREMEDICATION PREP.  1) Do not eat or drink anything after 10 am (4 hours prior to your test) 2) You have been given 2 bottles of oral contrast to drink. The solution may taste               better if refrigerated, but do NOT add ice or any other liquid to this solution. Shake             well before drinking.    Drink 1 bottle of contrast @ 12 pm (2 hours prior to your exam)  Drink 1 bottle of contrast @ 1 pm (1 hour prior to your exam)  You may take any medications as prescribed with a small amount of water except for the following: Metformin, Glucophage, Glucovance, Avandamet, Riomet, Fortamet, Actoplus Met, Janumet, Glumetza or Metaglip. The above medications must be held the day of the exam AND 48 hours after the exam.  The purpose of you drinking the oral contrast is to aid in the visualization of your intestinal tract. The contrast solution may cause some diarrhea. Before your exam is started, you will be given a small amount of fluid to drink. Depending on your individual set of symptoms, you may also receive an intravenous injection of x-ray contrast/dye. Plan on being at [facility] for 30 minutes or longer, depending on the type of exam you are having performed.  This test typically takes 30-45 minutes to complete.  If you have any questions regarding your exam or if you need to reschedule, you may call the CT  department at [phone] between the hours of 8:00 am and 5:00 pm, Monday-Friday.  ________________________________________________________________________

## 2017-04-12 ENCOUNTER — Telehealth: Payer: Self-pay | Admitting: Gastroenterology

## 2017-04-13 MED ORDER — LORAZEPAM 0.5 MG PO TABS: 0.5000 mg | ORAL_TABLET | Freq: Once | ORAL | 0 refills | Status: AC

## 2017-04-13 NOTE — Telephone Encounter (Signed)
Patient called requesting Valium or something to help calm him down before CT scan. Verbal order per Alonza Bogus, Ativan 0.5 mg #1 faxed in to [facility]. Patient informed.

## 2017-04-14 ENCOUNTER — Telehealth: Payer: Self-pay

## 2017-04-14 NOTE — Telephone Encounter (Signed)
-----   Message from Loralie Champagne, PA-C sent at 04/14/2017  1:07 PM EDT ----- Chong Sicilian, will you please call this patient and let him know that his CT scan has been denied. I even did a peer-to-peer to get it approved. They're requiring him to do another four-week course of PPI therapy before reconsidering. I asked him at his visit to please take his Protonix daily to see if it would help so he should already be doing this. After 4 weeks or so if he is still having symptoms despite medication that we can try again for this study. I even told him that he had already tried that back in 2015 and we first saw him for the same symptoms and they would still not improved.  He will get a letter from them with a formal denial as well.  Thank you,  Jess

## 2017-04-14 NOTE — Telephone Encounter (Signed)
Pt has been notified and will continue protonix and will call back in 4 weeks if symptoms do not improve

## 2017-04-18 ENCOUNTER — Inpatient Hospital Stay: Admission: RE | Admit: 2017-04-18 | Payer: Self-pay | Source: Ambulatory Visit

## 2017-07-19 ENCOUNTER — Encounter (HOSPITAL_COMMUNITY): Payer: Self-pay

## 2017-07-19 ENCOUNTER — Emergency Department (HOSPITAL_COMMUNITY)
Admission: EM | Admit: 2017-07-19 | Discharge: 2017-07-19 | Disposition: A | Discharge status: Home / Self Care | Payer: Managed Care, Other (non HMO) | Attending: Emergency Medicine | Admitting: Emergency Medicine

## 2017-07-19 DIAGNOSIS — R05 Cough: Secondary | ICD-10-CM | POA: Diagnosis not present

## 2017-07-19 DIAGNOSIS — J029 Acute pharyngitis, unspecified: Secondary | ICD-10-CM

## 2017-07-19 DIAGNOSIS — Z87891 Personal history of nicotine dependence: Secondary | ICD-10-CM | POA: Diagnosis not present

## 2017-07-19 DIAGNOSIS — J069 Acute upper respiratory infection, unspecified: Secondary | ICD-10-CM | POA: Insufficient documentation

## 2017-07-19 LAB — RAPID STREP SCREEN (MED CTR MEBANE ONLY): STREPTOCOCCUS, GROUP A SCREEN (DIRECT): NEGATIVE

## 2017-07-19 MED ORDER — FLUTICASONE PROPIONATE 50 MCG/ACT NA SUSP: 2.0000 | Freq: Every day | NASAL | 2 refills | Status: DC

## 2017-07-19 MED ORDER — DEXAMETHASONE SODIUM PHOSPHATE 10 MG/ML IJ SOLN
10.0000 mg | Freq: Once | INTRAMUSCULAR | Status: AC
  Administered 2017-07-19: 10 mg via INTRAMUSCULAR
  Filled 2017-07-19: qty 1

## 2017-07-19 MED ORDER — BENZONATATE 100 MG PO CAPS: 100.0000 mg | ORAL_CAPSULE | Freq: Three times a day (TID) | ORAL | 0 refills | Status: DC

## 2017-07-19 NOTE — Discharge Instructions (Signed)
1. Medications: flonase, mucinex, tessalon, usual home medications 2. Treatment: rest, drink plenty of fluids, take tylenol or ibuprofen for fever control 3. Follow Up: Please followup with your primary doctor in 3 days for discussion of your diagnoses and further evaluation after today's visit; if you do not have a primary care doctor use the resource guide provided to find one; Return to the ER for high fevers, difficulty breathing or other concerning symptoms

## 2017-07-19 NOTE — ED Notes (Signed)
Bed: WTR7 Expected date:  Expected time:  Means of arrival:  Comments: 

## 2017-07-19 NOTE — ED Triage Notes (Signed)
Pt complains of a sore throat and productive cough since yesterday

## 2017-07-19 NOTE — ED Provider Notes (Signed)
[facility] DEPT Provider Note   CSN: [id number] Arrival date & time: 07/19/17  7616     History   Chief Complaint Chief Complaint  Patient presents with  . Sore Throat    HPI Anthony Henderson is a 36 y.o. male with a hx of Anxiety, A. fib, SVT, GERD presents to the Emergency Department complaining of gradual, persistent, progressively worsening URI symptoms onset approximately 4 days ago. Associated symptoms include cough, nasal congestion, bilateral otalgia, sore throat, postnasal drip.  Patient reports taking Alka-Seltzer and DayQuil over-the-counter without significant relief. Nothing makes it better and nothing makes it worse.  Pt denies fevers, chills, headache, neck pain, chest pain, shortness of breath, abdominal pain, nausea, vomiting, diarrhea, weakness, dizziness, syncope, dysuria. Patient denies known tick bites, fever or rash.    Pt does "vape"   The history is provided by the patient and medical records. No language interpreter was used.    Past Medical History:  Diagnosis Date  . Abdominal pain   . Anxiety    "related to SVT"  . Atrial fibrillation ([place] )    following RFCA of SVT 03/2014 - on Flecainide  . Depression   . GERD (gastroesophageal reflux disease)   . Hiatal hernia   . High risk sexual behavior   . Other malaise and fatigue   . Panic attack   . Post-operative nausea and vomiting   . SVT (supraventricular tachycardia) [facility])    s/p ablation 03-10-14 by Dr Lovena Le    Patient Active Problem List   Diagnosis Date Noted  . Abdominal pain, epigastric 03/24/2016  . Pyrosis 03/24/2016  . AP (abdominal pain) 10/21/2014  . Nausea with vomiting 10/21/2014  . Esophageal reflux 08/27/2014  . Paroxysmal supraventricular tachycardia ([place]) 03/10/2014  . PSVT (paroxysmal supraventricular tachycardia) ([place]) 02/07/2014  . SVT (supraventricular tachycardia) ([place]) 02/03/2014  . Anxiety and depression 05/10/2013    Past Surgical History:  Procedure Laterality  Date  . ABLATION  03/10/2014   RFCA of left posterior septal accessory pathyway by Dr Lovena Le with resultant atrial fibrillation  . SUPRAVENTRICULAR TACHYCARDIA ABLATION N/A 03/10/2014   Procedure: SUPRAVENTRICULAR TACHYCARDIA ABLATION;  Surgeon: Evans Lance, MD;  Location: [facility] CATH LAB;  Service: Cardiovascular;  Laterality: N/A;       Home Medications    Prior to Admission medications   Medication Sig Start Date End Date Taking? Authorizing Provider  benzonatate (TESSALON) 100 MG capsule Take 1 capsule (100 mg total) by mouth every 8 (eight) hours. 07/19/17   Dontavius Keim, Jarrett Soho, PA-C  fluticasone (FLONASE) 50 MCG/ACT nasal spray Place 2 sprays into both nostrils daily. 07/19/17   Sherah Lund, Jarrett Soho, PA-C  ondansetron (ZOFRAN ODT) 4 MG disintegrating tablet 4mg  ODT q4 hours prn nausea/vomit Patient not taking: Reported on 04/11/2017 02/27/17   Nikeya Maxim, Jarrett Soho, PA-C  pantoprazole (PROTONIX) 20 MG tablet Take 20 mg by mouth daily.    [provider]  promethazine (PHENERGAN) 12.5 MG tablet Take 12.5 mg by mouth every 6 (six) hours as needed for nausea or vomiting.    [provider]  ranitidine (ZANTAC) 150 MG tablet Take 1 tablet (150 mg total) by mouth 2 (two) times daily. Patient not taking: Reported on 04/11/2017 02/27/17   Zaley Talley, Jarrett Soho, PA-C  sucralfate (CARAFATE) 1 g tablet Take 1 tab with meals and at bedtime. Patient not taking: Reported on 04/11/2017 02/27/17   Emeli Goguen, Jarrett Soho, PA-C    Family History Family History  Problem Relation Age of Onset  . Diabetes Maternal Grandmother   .  Colon cancer Neg Hx   . Esophageal cancer Neg Hx   . Rectal cancer Neg Hx   . Stomach cancer Neg Hx     Social History Social History  Substance Use Topics  . Smoking status: Former Smoker    Packs/day: 0.25    Years: 10.00    Types: Cigarettes    Quit date: 02/03/2014  . Smokeless tobacco: Never Used  . Alcohol use No     Comment: 03/10/2014 "might have a few  drinks 1-2 times/yr, if that"     Allergies   Penicillins   Review of Systems Review of Systems  Constitutional: Positive for fatigue. Negative for appetite change, chills and fever.  HENT: Positive for congestion, postnasal drip, rhinorrhea, sinus pressure and sore throat. Negative for ear discharge, ear pain and mouth sores.   Eyes: Negative for visual disturbance.  Respiratory: Positive for cough. Negative for chest tightness, shortness of breath, wheezing and stridor.   Cardiovascular: Negative for chest pain, palpitations and leg swelling.  Gastrointestinal: Negative for abdominal pain, diarrhea, nausea and vomiting.  Genitourinary: Negative for dysuria, frequency, hematuria and urgency.  Musculoskeletal: Negative for arthralgias, back pain, myalgias and neck stiffness.  Skin: Negative for rash.  Neurological: Negative for syncope, light-headedness, numbness and headaches.  Hematological: Negative for adenopathy.  Psychiatric/Behavioral: The patient is not nervous/anxious.   All other systems reviewed and are negative.    Physical Exam Updated Vital Signs BP 119/90 (BP Location: Right Arm)   Pulse 78   Temp (!) 97.4 F (36.3 C) (Oral)   Resp 18   SpO2 100%   Physical Exam  Constitutional: He appears well-developed and well-nourished. No distress.  HENT:  Head: Normocephalic and atraumatic.  Right Ear: Tympanic membrane, external ear and ear canal normal.  Left Ear: Tympanic membrane, external ear and ear canal normal.  Nose: Mucosal edema and rhinorrhea present. No epistaxis. Right sinus exhibits no maxillary sinus tenderness and no frontal sinus tenderness. Left sinus exhibits no maxillary sinus tenderness and no frontal sinus tenderness.  Mouth/Throat: Uvula is midline and mucous membranes are normal. Mucous membranes are not pale and not cyanotic. Posterior oropharyngeal edema and posterior oropharyngeal erythema present. No oropharyngeal exudate or tonsillar  abscesses.  Eyes: Pupils are equal, round, and reactive to light. Conjunctivae are normal.  Neck: Normal range of motion and full passive range of motion without pain.  Cardiovascular: Normal rate and intact distal pulses.   Pulmonary/Chest: Effort normal and breath sounds normal. No stridor.  Clear and equal breath sounds without focal wheezes, rhonchi, rales  Abdominal: Soft. There is no tenderness.  Musculoskeletal: Normal range of motion.  Lymphadenopathy:    He has no cervical adenopathy.  Neurological: He is alert.  Skin: Skin is warm and dry. No rash noted. He is not diaphoretic.  Psychiatric: He has a normal mood and affect.  Nursing note and vitals reviewed.    ED Treatments / Results  Labs (all labs ordered are listed, but only abnormal results are displayed) Labs Reviewed  RAPID STREP SCREEN (NOT AT [facility])  CULTURE, GROUP A STREP [facility])     Procedures Procedures (including critical care time)  Medications Ordered in ED Medications  dexamethasone (DECADRON) injection 10 mg (not administered)     Initial Impression / Assessment and Plan / ED Course  I have reviewed the triage vital signs and the nursing notes.  Pertinent labs & imaging results that were available during my care of the patient were reviewed by me and  considered in my medical decision making (see chart for details).     Pt with URI ssx.  He is afebrile without focal breath sounds.  Doubt PNA.  No CXR indicated at this time. Patients symptoms are consistent with URI, likely viral etiology and viral pharyngitis. Discussed that antibiotics are not indicated for viral infections. Pt will be discharged with symptomatic treatment.  Verbalizes understanding and is agreeable with plan. Pt is hemodynamically stable & in NAD prior to dc.   Final Clinical Impressions(s) / ED Diagnoses   Final diagnoses:  Viral pharyngitis  Upper respiratory tract infection, unspecified type    New Prescriptions New  Prescriptions   BENZONATATE (TESSALON) 100 MG CAPSULE    Take 1 capsule (100 mg total) by mouth every 8 (eight) hours.   FLUTICASONE (FLONASE) 50 MCG/ACT NASAL SPRAY    Place 2 sprays into both nostrils daily.     Kire Ferg, Gwenlyn Perking 07/19/17 [place], April, MD 07/19/17 [phone]

## 2017-07-21 LAB — CULTURE, GROUP A STREP (THRC)

## 2017-08-29 ENCOUNTER — Other Ambulatory Visit: Payer: Self-pay | Admitting: Internal Medicine

## 2017-08-29 NOTE — Telephone Encounter (Signed)
Per review of last progress note, Dr. Lovena Le approved 50 mg flecainide by mouth daily until follow up appt designated for 2 years out.  Will approve another year supply.

## 2017-08-29 NOTE — Telephone Encounter (Signed)
Spoke with patient to see if he is still taking this medication as it is not on his current med list. Looks like it was discontinued on 02/27/17 with a reason of patient preference by Abigail Butts, PA. Patient stated that he does not recall this and he has been taking the medication. He states that he only takes one-half tablet qd and that Dr Lovena Le was aware of this. Please advise on refill request. Thanks, MI

## 2017-08-30 ENCOUNTER — Other Ambulatory Visit: Payer: Self-pay | Admitting: Internal Medicine

## 2017-08-30 NOTE — Telephone Encounter (Signed)
Medication Detail    Disp Refills Start End   flecainide (TAMBOCOR) 100 MG tablet 45 tablet 3 08/29/2017    Sig: Take 100 mg (1/2 tablet) by mouth daily.   Sent to pharmacy as: flecainide (TAMBOCOR) 100 MG tablet   E-Prescribing Status: Receipt confirmed by pharmacy (08/29/2017 3:57 PM EDT)   Pharmacy   [id number] - [place], [place] - [address]

## 2018-08-17 ENCOUNTER — Emergency Department (HOSPITAL_COMMUNITY): Payer: Managed Care, Other (non HMO)

## 2018-08-17 ENCOUNTER — Emergency Department (HOSPITAL_COMMUNITY)
Admission: EM | Admit: 2018-08-17 | Discharge: 2018-08-17 | Disposition: A | Discharge status: Home / Self Care | Payer: Managed Care, Other (non HMO) | Attending: Emergency Medicine | Admitting: Emergency Medicine

## 2018-08-17 ENCOUNTER — Other Ambulatory Visit: Payer: Self-pay

## 2018-08-17 ENCOUNTER — Encounter (HOSPITAL_COMMUNITY): Payer: Self-pay | Admitting: Emergency Medicine

## 2018-08-17 DIAGNOSIS — E860 Dehydration: Secondary | ICD-10-CM

## 2018-08-17 DIAGNOSIS — B9789 Other viral agents as the cause of diseases classified elsewhere: Secondary | ICD-10-CM | POA: Insufficient documentation

## 2018-08-17 DIAGNOSIS — Z79899 Other long term (current) drug therapy: Secondary | ICD-10-CM | POA: Insufficient documentation

## 2018-08-17 DIAGNOSIS — R42 Dizziness and giddiness: Secondary | ICD-10-CM | POA: Insufficient documentation

## 2018-08-17 DIAGNOSIS — J069 Acute upper respiratory infection, unspecified: Secondary | ICD-10-CM | POA: Insufficient documentation

## 2018-08-17 DIAGNOSIS — Z87891 Personal history of nicotine dependence: Secondary | ICD-10-CM | POA: Insufficient documentation

## 2018-08-17 LAB — CBC WITH DIFFERENTIAL/PLATELET
Abs Immature Granulocytes: 0 10*3/uL (ref 0.0–0.1)
Basophils Absolute: 0 10*3/uL (ref 0.0–0.1)
Basophils Relative: 0 %
Eosinophils Absolute: 0 10*3/uL (ref 0.0–0.7)
Eosinophils Relative: 1 %
HEMATOCRIT: 44.2 % (ref 39.0–52.0)
HEMOGLOBIN: 14.8 g/dL (ref 13.0–17.0)
Immature Granulocytes: 0 %
LYMPHS ABS: 0.9 10*3/uL (ref 0.7–4.0)
LYMPHS PCT: 16 %
MCH: 30 pg (ref 26.0–34.0)
MCHC: 33.5 g/dL (ref 30.0–36.0)
MCV: 89.7 fL (ref 78.0–100.0)
MONOS PCT: 11 %
Monocytes Absolute: 0.6 10*3/uL (ref 0.1–1.0)
Neutro Abs: 3.7 10*3/uL (ref 1.7–7.7)
Neutrophils Relative %: 72 %
Platelets: 256 10*3/uL (ref 150–400)
RBC: 4.93 MIL/uL (ref 4.22–5.81)
RDW: 11 % — ABNORMAL LOW (ref 11.5–15.5)
WBC: 5.2 10*3/uL (ref 4.0–10.5)

## 2018-08-17 LAB — BASIC METABOLIC PANEL
Anion gap: 10 (ref 5–15)
BUN: 9 mg/dL (ref 6–20)
CHLORIDE: 102 mmol/L (ref 98–111)
CO2: 26 mmol/L (ref 22–32)
CREATININE: 1.06 mg/dL (ref 0.61–1.24)
Calcium: 9.2 mg/dL (ref 8.9–10.3)
GFR calc non Af Amer: >60 mL/min (ref >60)
GLUCOSE: 117 mg/dL — AB (ref 70–99)
Potassium: 3.8 mmol/L (ref 3.5–5.1)
Sodium: 138 mmol/L (ref 135–145)

## 2018-08-17 MED ORDER — ONDANSETRON HCL 4 MG/2ML IJ SOLN
4.0000 mg | Freq: Once | INTRAMUSCULAR | Status: AC
  Administered 2018-08-17: 4 mg via INTRAVENOUS
  Filled 2018-08-17: qty 2

## 2018-08-17 MED ORDER — ONDANSETRON 4 MG PO TBDP: 4.0000 mg | ORAL_TABLET | Freq: Three times a day (TID) | ORAL | 0 refills | Status: DC | PRN

## 2018-08-17 MED ORDER — SODIUM CHLORIDE 0.9 % IV BOLUS
1000.0000 mL | Freq: Once | INTRAVENOUS | Status: AC
  Administered 2018-08-17: 1000 mL via INTRAVENOUS

## 2018-08-17 NOTE — ED Provider Notes (Signed)
[facility] [facility] EMERGENCY DEPARTMENT Provider Note   CSN: [id number] Arrival date & time: 08/17/18  0548     History   Chief Complaint Chief Complaint  Patient presents with  . Shortness of Breath    HPI Anthony Henderson is a 37 y.o. male.  HPI  37 year old male with a history of SVT, atrial fibrillation presents with concern for nausea, shortness of breath, cough, congestion and lightheadedness.  Patient reports symptoms have been going on for the last few days.  Reports that his main concern is feeling lightheaded.  Reports he has occasional dyspnea, but denies any shortness of breath right now.  Denies palpitations.  Denies chest pain, leg swelling, recent surgeries, recent immobilization.  Reports he has had nausea and a few episodes of diarrhea. Says he wanted to come and make sure nothing major is wrong.  His son also reported he doesn't feel well.   Past Medical History:  Diagnosis Date  . Abdominal pain   . Anxiety    "related to SVT"  . Atrial fibrillation ([place])    following RFCA of SVT 03/2014 - on Flecainide  . Depression   . GERD (gastroesophageal reflux disease)   . Hiatal hernia   . High risk sexual behavior   . Other malaise and fatigue   . Panic attack   . Post-operative nausea and vomiting   . SVT (supraventricular tachycardia) [facility])    s/p ablation 03-10-14 by Dr Lovena Le    Patient Active Problem List   Diagnosis Date Noted  . Abdominal pain, epigastric 03/24/2016  . Pyrosis 03/24/2016  . AP (abdominal pain) 10/21/2014  . Nausea with vomiting 10/21/2014  . Esophageal reflux 08/27/2014  . Paroxysmal supraventricular tachycardia ([place]) 03/10/2014  . PSVT (paroxysmal supraventricular tachycardia) ([place]) 02/07/2014  . SVT (supraventricular tachycardia) ([place]) 02/03/2014  . Anxiety and depression 05/10/2013    Past Surgical History:  Procedure Laterality Date  . ABLATION  03/10/2014   RFCA of left posterior septal accessory pathyway by Dr  Lovena Le with resultant atrial fibrillation  . SUPRAVENTRICULAR TACHYCARDIA ABLATION N/A 03/10/2014   Procedure: SUPRAVENTRICULAR TACHYCARDIA ABLATION;  Surgeon: Evans Lance, MD;  Location: [facility] CATH LAB;  Service: Cardiovascular;  Laterality: N/A;        Home Medications    Prior to Admission medications   Medication Sig Start Date End Date Taking? Authorizing Provider  benzonatate (TESSALON) 100 MG capsule Take 1 capsule (100 mg total) by mouth every 8 (eight) hours. 07/19/17   Muthersbaugh, Jarrett Soho, PA-C  flecainide (TAMBOCOR) 100 MG tablet Take 100 mg (1/2 tablet) by mouth daily. 08/29/17   Evans Lance, MD  fluticasone (FLONASE) 50 MCG/ACT nasal spray Place 2 sprays into both nostrils daily. 07/19/17   Muthersbaugh, Jarrett Soho, PA-C  ondansetron (ZOFRAN ODT) 4 MG disintegrating tablet 4mg  ODT q4 hours prn nausea/vomit Patient not taking: Reported on 04/11/2017 02/27/17   Muthersbaugh, Jarrett Soho, PA-C  ondansetron (ZOFRAN ODT) 4 MG disintegrating tablet Take 1 tablet (4 mg total) by mouth every 8 (eight) hours as needed for nausea or vomiting. 08/17/18   Gareth Morgan, MD  pantoprazole (PROTONIX) 20 MG tablet Take 20 mg by mouth daily.    [provider]  promethazine (PHENERGAN) 12.5 MG tablet Take 12.5 mg by mouth every 6 (six) hours as needed for nausea or vomiting.    [provider]  ranitidine (ZANTAC) 150 MG tablet Take 1 tablet (150 mg total) by mouth 2 (two) times daily. Patient not taking: Reported on 04/11/2017  02/27/17   Muthersbaugh, Jarrett Soho, PA-C  sucralfate (CARAFATE) 1 g tablet Take 1 tab with meals and at bedtime. Patient not taking: Reported on 04/11/2017 02/27/17   Muthersbaugh, Jarrett Soho, PA-C    Family History Family History  Problem Relation Age of Onset  . Diabetes Maternal Grandmother   . Colon cancer Neg Hx   . Esophageal cancer Neg Hx   . Rectal cancer Neg Hx   . Stomach cancer Neg Hx     Social History Social History   Tobacco Use  . Smoking status:  Former Smoker    Packs/day: 0.25    Years: 10.00    Pack years: 2.50    Types: Cigarettes    Last attempt to quit: 02/03/2014    Years since quitting: 4.5  . Smokeless tobacco: Never Used  Substance Use Topics  . Alcohol use: No    Alcohol/week: 0.0 standard drinks    Comment: 03/10/2014 "might have a few drinks 1-2 times/yr, if that"  . Drug use: No     Allergies   Penicillins   Review of Systems Review of Systems  Constitutional: Positive for fatigue. Negative for fever.  HENT: Positive for congestion and sore throat.   Eyes: Negative for visual disturbance.  Respiratory: Positive for cough and shortness of breath. Negative for wheezing.   Cardiovascular: Negative for chest pain and leg swelling.  Gastrointestinal: Positive for diarrhea, nausea and vomiting (one episode). Negative for abdominal pain.  Genitourinary: Negative for difficulty urinating.  Musculoskeletal: Negative for back pain and neck stiffness.  Skin: Negative for rash.  Neurological: Positive for light-headedness. Negative for syncope and headaches.     Physical Exam Updated Vital Signs BP 104/78   Pulse 72   Temp 98.1 F (36.7 C) (Oral)   Resp 19   Ht 6' (1.829 m)   Wt 63.5 kg   SpO2 99%   BMI 18.99 kg/m   Physical Exam  Constitutional: He is oriented to person, place, and time. He appears well-developed and well-nourished. No distress.  HENT:  Head: Normocephalic and atraumatic.  Eyes: Conjunctivae and EOM are normal.  Neck: Normal range of motion.  Cardiovascular: Normal rate, regular rhythm, normal heart sounds and intact distal pulses. Exam reveals no gallop and no friction rub.  No murmur heard. Pulmonary/Chest: Effort normal and breath sounds normal. No respiratory distress. He has no wheezes. He has no rales.  Abdominal: Soft. He exhibits no distension. There is no tenderness. There is no guarding.  Musculoskeletal: He exhibits no edema.  Neurological: He is alert and oriented to  person, place, and time.  Skin: Skin is warm and dry. He is not diaphoretic.  Nursing note and vitals reviewed.    ED Treatments / Results  Labs (all labs ordered are listed, but only abnormal results are displayed) Labs Reviewed  CBC WITH DIFFERENTIAL/PLATELET - Abnormal; Notable for the following components:      Result Value   RDW 11.0 (*)    All other components within normal limits  BASIC METABOLIC PANEL - Abnormal; Notable for the following components:   Glucose, Bld 117 (*)    All other components within normal limits    EKG EKG Interpretation  Date/Time:  Friday August 17 2018 05:56:14 EDT Ventricular Rate:  89 PR Interval:  142 QRS Duration: 78 QT Interval:  336 QTC Calculation: 408 R Axis:   76 Text Interpretation:  Normal sinus rhythm Septal infarct , age undetermined Abnormal ECG Artifact No significant change since last tracing Confirmed  by Gareth Morgan [phone]) on 08/17/2018 7:01:42 AM   Radiology Dg Chest 2 View  Result Date: 08/17/2018 CLINICAL DATA:  Shortness of breath. EXAM: CHEST - 2 VIEW COMPARISON:  Radiograph 10/29/2014 FINDINGS: The cardiomediastinal contours are normal. Chronic borderline hyperinflation. Pulmonary vasculature is normal. No consolidation, pleural effusion, or pneumothorax. No acute osseous abnormalities are seen. IMPRESSION: No acute findings.  Chronic borderline hyperinflation. Electronically Signed   By: Keith Rake M.D.   On: 08/17/2018 06:44    Procedures Procedures (including critical care time)  Medications Ordered in ED Medications  sodium chloride 0.9 % bolus 1,000 mL (1,000 mLs Intravenous New Bag/Given 08/17/18 0811)  ondansetron (ZOFRAN) injection 4 mg (4 mg Intravenous Given 08/17/18 0811)     Initial Impression / Assessment and Plan / ED Course  I have reviewed the triage vital signs and the nursing notes.  Pertinent labs & imaging results that were available during my care of the patient were reviewed by  me and considered in my medical decision making (see chart for details).     37 year old male with a history of SVT, atrial fibrillation presents with concern for nausea, shortness of breath, cough, congestion and lightheadedness.  EKG with sinus rhythm.  Chest x-ray shows no sign of pneumonia, edema or pneumothorax.  Have low suspicion for pulmonary embolus, pt PERC negative and denies current dyspnea. Labs without anemia, no electrolyte abnormalities. Suspect some hypovolemia from diarrhea and viral etiology of lightheadedness. Given IV fluids, zofran. Suspect combination of cough, congestion, nausea, fatigue and lightheadedness secondary to viral illness, younger child has also been sick. Recommend continued supportive care, hydration, zofran. Patient discharged in stable condition with understanding of reasons to return.   Final Clinical Impressions(s) / ED Diagnoses   Final diagnoses:  Lightheadedness  Dehydration  Viral URI with cough    ED Discharge Orders         Ordered    ondansetron (ZOFRAN ODT) 4 MG disintegrating tablet  Every 8 hours PRN     08/17/18 0903           Gareth Morgan, MD 08/17/18 [phone]

## 2018-08-17 NOTE — ED Triage Notes (Signed)
Pt c/o shortness of breath, nausea, and dizziness x "a couple of days". Denies chest pain/abdominal pain/emesis.

## 2018-08-17 NOTE — ED Notes (Signed)
C/o nausea and feeling weak for several days. States his younger child hasn't been feeling well either.

## 2018-09-20 ENCOUNTER — Ambulatory Visit (INDEPENDENT_AMBULATORY_CARE_PROVIDER_SITE_OTHER): Payer: PRIVATE HEALTH INSURANCE | Admitting: Gastroenterology

## 2018-09-20 ENCOUNTER — Encounter: Payer: Self-pay | Admitting: Gastroenterology

## 2018-09-20 VITALS — BP 110/60 | HR 78 | Ht 72.0 in | Wt 134.0 lb

## 2018-09-20 DIAGNOSIS — R1084 Generalized abdominal pain: Secondary | ICD-10-CM | POA: Diagnosis not present

## 2018-09-20 DIAGNOSIS — R1013 Epigastric pain: Secondary | ICD-10-CM | POA: Diagnosis not present

## 2018-09-20 DIAGNOSIS — R110 Nausea: Secondary | ICD-10-CM

## 2018-09-20 DIAGNOSIS — R194 Change in bowel habit: Secondary | ICD-10-CM | POA: Insufficient documentation

## 2018-09-20 DIAGNOSIS — K9189 Other postprocedural complications and disorders of digestive system: Secondary | ICD-10-CM | POA: Insufficient documentation

## 2018-09-20 NOTE — Patient Instructions (Signed)
You have been scheduled for a CT scan of the abdomen and pelvis at [facility] ([address]---this is in the same building as [facility]).   You are scheduled on 10/01/2018 at 2:30pm. You should arrive 15 minutes prior to your appointment time for registration. Please follow the written instructions below on the day of your exam:  WARNING: IF YOU ARE ALLERGIC TO IODINE/X-RAY DYE, PLEASE NOTIFY RADIOLOGY IMMEDIATELY AT [phone]! YOU WILL BE GIVEN A 13 HOUR PREMEDICATION PREP.  1) Do not eat or drink anything after 10:30am (4 hours prior to your test) 2) You have been given 2 bottles of oral contrast to drink. The solution may taste better if refrigerated, but do NOT add ice or any other liquid to this solution. Shake well before drinking.    Drink 1 bottle of contrast @ 12:30pm (2 hours prior to your exam)  Drink 1 bottle of contrast @ 1:30pm (1 hour prior to your exam)  You may take any medications as prescribed with a small amount of water, if necessary. If you take any of the following medications: METFORMIN, GLUCOPHAGE, GLUCOVANCE, AVANDAMET, RIOMET, FORTAMET, [place] MET, JANUMET, GLUMETZA or METAGLIP, you MAY be asked to HOLD this medication 48 hours AFTER the exam.  The purpose of you drinking the oral contrast is to aid in the visualization of your intestinal tract. The contrast solution may cause some diarrhea. Depending on your individual set of symptoms, you may also receive an intravenous injection of x-ray contrast/dye. Plan on being at [facility] for 30 minutes or longer, depending on the type of exam you are having performed.  This test typically takes 30-45 minutes to complete.  If you have any questions regarding your exam or if you need to reschedule, you may call the CT department at [phone] between the hours of 8:00 am and 5:00 pm, Monday-Friday.  ________________________________________________________________________

## 2018-09-20 NOTE — Progress Notes (Addendum)
09/20/2018 Anthony Henderson [id number] [birth year]   HISTORY OF PRESENT ILLNESS: This is a pleasant 37 year old African-American male who has been seen by me on 3 other occasions dating back to 2015 for complaints of nausea, intermittent vomiting, and abdominal pains.  He is undergone EGD, ultrasound, HIDA scan, and gastric emptying scan, which is all been unrevealing except for a small hiatal hernia.  He has been using Reglan once or twice daily for nausea that was given to him by his PCP, but stopped taking that for the past few days.  He is here today again with the same complaints.  Says that he was in [place] a couple of weeks ago for his anniversary and ended up in the emergency department down there with complaints of severe epigastric/mid abdominal pain.  Apparently labs including pancreatic levels were unremarkable although I do not have those records.  No imaging was performed as he was told that CT scan would not be covered.  He tells me that he is now been taking Pepcid 10 mg daily for the past week or so.  He tells me that the epigastric pain was a sharp stabbing pain.  He had eaten some greasy [place] food for lunch and had a couple of drinks prior to the onset of this pain on that last occasion.  He is not currently having any abdominal pain.  He tells me that he is "tired of taking medication ".  Also reports that his stools have have a "fuzzy" appearance, but not diarrhea and no dark or bloody stools.   Past Medical History:  Diagnosis Date  . Abdominal pain   . Anxiety    "related to SVT"  . Atrial fibrillation ([place])    following RFCA of SVT 03/2014 - on Flecainide  . Depression   . GERD (gastroesophageal reflux disease)   . Hiatal hernia   . High risk sexual behavior   . Other malaise and fatigue   . Panic attack   . Post-operative nausea and vomiting   . SVT (supraventricular tachycardia) [facility])    s/p ablation 03-10-14 by Dr Lovena Le   Past Surgical History:  Procedure  Laterality Date  . ABLATION  03/10/2014   RFCA of left posterior septal accessory pathyway by Dr Lovena Le with resultant atrial fibrillation  . SUPRAVENTRICULAR TACHYCARDIA ABLATION N/A 03/10/2014   Procedure: SUPRAVENTRICULAR TACHYCARDIA ABLATION;  Surgeon: Evans Lance, MD;  Location: [facility] CATH LAB;  Service: Cardiovascular;  Laterality: N/A;    reports that he quit smoking about 4 years ago. His smoking use included cigarettes. He has a 2.50 pack-year smoking history. He has never used smokeless tobacco. He reports that he does not drink alcohol or use drugs. family history includes Diabetes in his maternal grandmother. Allergies  Allergen Reactions  . Penicillins Hives      Outpatient Encounter Medications as of 09/20/2018  Medication Sig  . famotidine (PEPCID) 10 MG tablet Take 10 mg by mouth daily.  . flecainide (TAMBOCOR) 100 MG tablet Take 50 mg by mouth daily as needed.  . metoCLOPramide (REGLAN) 10 MG tablet Take 1 tablet by mouth 2 (two) times daily.  . ondansetron (ZOFRAN ODT) 4 MG disintegrating tablet Take 1 tablet (4 mg total) by mouth every 8 (eight) hours as needed for nausea or vomiting.  . [DISCONTINUED] benzonatate (TESSALON) 100 MG capsule Take 1 capsule (100 mg total) by mouth every 8 (eight) hours.  . [DISCONTINUED] flecainide (TAMBOCOR) 100 MG tablet Take 100 mg (1/2  tablet) by mouth daily. (Patient taking differently: Take 100 mg (1/2 tablet) by mouth daily as needed)  . [DISCONTINUED] fluticasone (FLONASE) 50 MCG/ACT nasal spray Place 2 sprays into both nostrils daily.  . [DISCONTINUED] ondansetron (ZOFRAN ODT) 4 MG disintegrating tablet 4mg  ODT q4 hours prn nausea/vomit (Patient not taking: Reported on 04/11/2017)  . [DISCONTINUED] pantoprazole (PROTONIX) 20 MG tablet Take 20 mg by mouth daily.  . [DISCONTINUED] promethazine (PHENERGAN) 12.5 MG tablet Take 12.5 mg by mouth every 6 (six) hours as needed for nausea or vomiting.  . [DISCONTINUED] ranitidine (ZANTAC) 150 MG  tablet Take 1 tablet (150 mg total) by mouth 2 (two) times daily. (Patient not taking: Reported on 04/11/2017)  . [DISCONTINUED] sucralfate (CARAFATE) 1 g tablet Take 1 tab with meals and at bedtime. (Patient not taking: Reported on 04/11/2017)   No facility-administered encounter medications on file as of 09/20/2018.      REVIEW OF SYSTEMS  : All other systems reviewed and negative except where noted in the History of Present Illness.   PHYSICAL EXAM: BP 110/60   Pulse 78   Ht 6' (1.829 m)   Wt 134 lb (60.8 kg)   BMI 18.17 kg/m  General: Well developed black male in no acute distress Head: Normocephalic and atraumatic Eyes:  Sclerae anicteric, conjunctiva pink. Ears: Normal auditory acuity Lungs: Clear throughout to auscultation; no increased WOB. Heart: Regular rate and rhythm; no M/R/G. Abdomen: Soft, non-distended.  BS present.  Non-tender. Musculoskeletal: Symmetrical with no gross deformities  Skin: No lesions on visible extremities Extremities: No edema  Neurological: Alert oriented x 4, grossly non-focal Psychological:  Alert and cooperative. Normal mood and affect  ASSESSMENT AND PLAN: *37 year old male with chronic complaints of nausea, epigastric/mid-abdominal pain, and now some mild change in bowel habits.  He's had EGD, GES, ultrasound, and HIDA scan in the past, all unremarkable except small hiatal hernia.  Tried to get CT scan in the past but was apparently declined by insurance coverage.  Continues to complain of the same symptoms.  I really think that his nausea and epi pain could be GERD related, but he does not take any medication regularly because he does not like to take medication and is tired of taking medications.  Weight is stable.  Will try again for CT scan abdomen and pelvis with contrast.  If unremarkable then will  Try to encourage him to take at least pepcid daily for reflux related issues for some time to see if that helps.   CC:  No ref. provider found     Addendum: Reviewed and agree with assessment and management plan. Pyrtle, Lajuan Lines, MD

## 2018-09-24 ENCOUNTER — Telehealth: Payer: Self-pay | Admitting: Gastroenterology

## 2018-09-24 NOTE — Telephone Encounter (Signed)
The pt will come by our office and pick up some contrast to replace what he drank today in error.  I will leave it at the front desk.

## 2018-09-25 ENCOUNTER — Encounter: Payer: Self-pay | Admitting: Internal Medicine

## 2018-10-01 ENCOUNTER — Ambulatory Visit (INDEPENDENT_AMBULATORY_CARE_PROVIDER_SITE_OTHER)
Admission: RE | Admit: 2018-10-01 | Discharge: 2018-10-01 | Disposition: A | Discharge status: Home / Self Care | Payer: PRIVATE HEALTH INSURANCE | Source: Ambulatory Visit | Attending: Gastroenterology | Admitting: Gastroenterology

## 2018-10-01 DIAGNOSIS — R110 Nausea: Secondary | ICD-10-CM

## 2018-10-01 DIAGNOSIS — R194 Change in bowel habit: Secondary | ICD-10-CM | POA: Diagnosis not present

## 2018-10-01 DIAGNOSIS — R1013 Epigastric pain: Secondary | ICD-10-CM

## 2018-10-01 DIAGNOSIS — R1084 Generalized abdominal pain: Secondary | ICD-10-CM | POA: Diagnosis not present

## 2018-10-01 MED ORDER — IOPAMIDOL (ISOVUE-300) INJECTION 61%
100.0000 mL | Freq: Once | INTRAVENOUS | Status: AC | PRN
  Administered 2018-10-01: 100 mL via INTRAVENOUS

## 2018-10-02 ENCOUNTER — Telehealth: Payer: Self-pay | Admitting: Gastroenterology

## 2018-10-02 MED ORDER — ONDANSETRON 4 MG PO TBDP: 4.0000 mg | ORAL_TABLET | Freq: Every day | ORAL | 0 refills | Status: AC | PRN

## 2018-10-02 NOTE — Telephone Encounter (Signed)
The pt has been advised  

## 2018-10-02 NOTE — Telephone Encounter (Signed)
The pt has been advised and states he would like a refill of ODT zofran.  Is this ok?

## 2018-10-02 NOTE — Telephone Encounter (Signed)
He can use zofran prn but I would prefer if he is needing it daily then would recommend trying something else instead (like pepcid) and using zofran for back-up prn.  Give a prescription for 30 with one refill.  Thank you,  Jess

## 2018-10-02 NOTE — Telephone Encounter (Signed)
Pt has questions regarding ct scan results. Pls call him.

## 2018-10-02 NOTE — Telephone Encounter (Signed)
Zehr, Laban Emperor, PA-C  Timothy Lasso, RN        Please let him know that his CT scan was completely normal. I know that he does not like to take medication, but please see if he is willing to at least try some pepcid 40 mg daily for several weeks to see if that will help with his nausea and epigastric pain. This treatment is mild, but something that I am hoping he will be willing to start with. Please prescribe if he is willing and bring him in for follow-up with Pyrtle in 6 weeks or so.   Thank you,   Jess

## 2018-10-09 ENCOUNTER — Other Ambulatory Visit: Payer: Self-pay | Admitting: Internal Medicine

## 2018-10-09 MED ORDER — FLECAINIDE ACETATE 100 MG PO TABS: 50.0000 mg | ORAL_TABLET | Freq: Every day | ORAL | 0 refills | Status: DC | PRN

## 2018-10-12 ENCOUNTER — Ambulatory Visit (INDEPENDENT_AMBULATORY_CARE_PROVIDER_SITE_OTHER): Payer: PRIVATE HEALTH INSURANCE | Admitting: Internal Medicine

## 2018-10-12 ENCOUNTER — Encounter: Payer: Self-pay | Admitting: Internal Medicine

## 2018-10-12 VITALS — BP 122/68 | HR 73 | Ht 72.0 in | Wt 132.0 lb

## 2018-10-12 DIAGNOSIS — I471 Supraventricular tachycardia: Secondary | ICD-10-CM | POA: Diagnosis not present

## 2018-10-12 MED ORDER — PROPAFENONE HCL 225 MG PO TABS: 225.0000 mg | ORAL_TABLET | Freq: Two times a day (BID) | ORAL | 11 refills | Status: DC

## 2018-10-12 NOTE — Patient Instructions (Addendum)
Medication Instructions:  Your physician has recommended you make the following change in your medication:    1.  Stop taking flecainide  2.  Start taking propafenone 225 mg ---Take one tablet by mouth twice a day   Labwork: None ordered.  Testing/Procedures: You will come back to the [facility] office in 2 weeks for a nurse visit for a 12 lead EKG due to new start Propafenone.  Please schedule for either November 21 or 22, 2019  Follow-Up: Your physician wants you to follow-up in: 6 months with Dr. Lovena Le.   You will receive a reminder letter in the mail two months in advance. If you don't receive a letter, please call our office to schedule the follow-up appointment.  Any Other Special Instructions Will Be Listed Below (If Applicable).  If you need a refill on your cardiac medications before your next appointment, please call your pharmacy.   Propafenone tablets What is this medicine? PROPAFENONE (proe pa FEEN one) is an antiarrhythmic agent. It is used to treat irregular heart rhythm and can slow rapid heartbeats. This medicine can help your heart to return to and maintain a normal rhythm. This medicine may be used for other purposes; ask your health care provider or pharmacist if you have questions. COMMON BRAND NAME(S): Rythmol What should I tell my health care provider before I take this medicine? They need to know if you have any of these conditions: -heart disease -high blood levels of potassium -kidney disease -liver disease -low blood pressure -lung disease like asthma, chronic bronchitis or emphysema -pacemaker -slow heart rate -an unusual or allergic reaction to propafenone, other medicines, foods, dyes, or preservatives -pregnant or trying to get pregnant -breast-feeding How should I use this medicine? Take this medicine by mouth with a glass of water. Follow the directions on the prescription label. You can take this medicine with or without food. Take your  doses at regular intervals. Do not take your medicine more often than directed. Do not stop taking except on the advice of your doctor or health care professional. Talk to your pediatrician regarding the use of this medicine in children. Special care may be needed. Overdosage: If you think you have taken too much of this medicine contact a poison control center or emergency room at once. NOTE: This medicine is only for you. Do not share this medicine with others. What if I miss a dose? If you miss a dose, take it as soon as you can. If it is almost time for your next dose, take only that dose. Do not take double or extra doses. What may interact with this medicine? Do not take this medicine with any of the following medications: -arsenic trioxide -certain antibiotics like clarithromycin, erythromycin, grepafloxacin, pentamidine, sparfloxacin, troleandomycin -certain medicines for depression or mental illness like amoxapine, haloperidol, maprotiline, pimozide, sertindole, thioridazine, tricyclic antidepressants, ziprasidone -certain medicines for fungal infections like fluconazole, itraconazole, ketoconazole, posaconazole, voriconazole -certain medicines for irregular heart beat like dofetilide, dronedarone -certain medicines for malaria like chloroquine, halofantrine -cisapride -droperidol -levomethadyl -ranolazine -ritonavir This medicine may also interact with the following medications: -certain medicines for angina or blood pressure -certain medicines for asthma or breathing difficulties like formoterol, salmeterol -certain medicines that treat or prevent blood clots like warfarin -cimetidine -cyclosporine -digoxin -diuretics -local anesthetics -other medicines that prolong the QT interval (cause an abnormal heart rhythm) -rifampin -theophylline This list may not describe all possible interactions. Give your health care provider a list of all the medicines, herbs, non-prescription  drugs, or dietary supplements you use. Also tell them if you smoke, drink alcohol, or use illegal drugs. Some items may interact with your medicine. What should I watch for while using this medicine? Your condition will be monitored closely when you first begin therapy. Often, this drug is first started in a hospital or other monitored health care setting. Once you are on maintenance therapy, visit your doctor or health care professional for regular checks on your progress. Because your condition and use of this medicine carry some risk, it is a good idea to carry an identification card, necklace or bracelet with details of your condition, medications, and doctor or health care professional. [name] may get drowsy or dizzy. Do not drive, use machinery, or do anything that needs mental alertness until you know how this medicine affects you. Do not stand or sit up quickly, especially if you are an older patient. This reduces the risk of dizzy or fainting spells. If you are going to have surgery, tell your doctor or health care professional that you are taking this medicine. What side effects may I notice from receiving this medicine? Side effects that you should report to your doctor or health care professional as soon as possible: -chest pain, palpitations -fever or chills -shortness of breath -swelling of feet or legs -trembling or shaking Side effects that usually do not require medical attention (report to your doctor or health care professional if they continue or are bothersome): -blurred vision -changes in taste (a metallic or bitter taste) -constipation or diarrhea -dry mouth -headache -nausea or vomiting -tiredness or weakness This list may not describe all possible side effects. Call your doctor for medical advice about side effects. You may report side effects to FDA at 1-800-FDA-1088. Where should I keep my medicine? Keep out of the reach of children. Store at room temperature between 15  and 30 degrees C (59 and 86 degrees F). Protect from light. Keep container tightly closed. Throw away any unused medicine after the expiration date. NOTE: This sheet is a summary. It may not cover all possible information. If you have questions about this medicine, talk to your doctor, pharmacist, or health care provider.  2018 Elsevier/Gold Standard (2013-06-17 13:27:06)

## 2018-10-12 NOTE — Progress Notes (Addendum)
HPI [name] is a pleasant 37 yo man with a h/o SVT who underwent EPS/RFA of a concealed but decrementally conducting AP/SVT 4 years ago. I saw him last 2 years ago. He has had recurrent palpitations. No syncope. He is taking as needed flecainide but notes side effects on this medication incluidng nausea. He is taking 50 mg twice daily, though sometimes even less. The patient's AP was mapped to its early site inside the CS but during the ablation developed atrial fib. Because he had no antegrade conduction, we could not map the patheway and since then he has been stable on medical therapy, despite having an occaisional breakthrough of SVT. He denies angina.  Allergies  Allergen Reactions  . Penicillins Hives     Current Outpatient Medications  Medication Sig Dispense Refill  . famotidine (PEPCID) 10 MG tablet Take 10 mg by mouth daily.    . flecainide (TAMBOCOR) 100 MG tablet Take 0.5 tablets (50 mg total) by mouth daily as needed. Please keep upcoming appt in November for future refills. Thank you 15 tablet 0  . metoCLOPramide (REGLAN) 10 MG tablet Take 1 tablet by mouth 2 (two) times daily.  2  . ondansetron (ZOFRAN ODT) 4 MG disintegrating tablet Take 1 tablet (4 mg total) by mouth daily as needed for nausea or vomiting. 30 tablet 0   No current facility-administered medications for this visit.      Past Medical History:  Diagnosis Date  . Abdominal pain   . Anxiety    "related to SVT"  . Atrial fibrillation ([place])    following RFCA of SVT 03/2014 - on Flecainide  . Depression   . GERD (gastroesophageal reflux disease)   . Hiatal hernia   . High risk sexual behavior   . Other malaise and fatigue   . Panic attack   . Post-operative nausea and vomiting   . SVT (supraventricular tachycardia) ([place])    s/p ablation 03-10-14 by Dr Lovena Le    ROS:   All systems reviewed and negative except as noted in the HPI.   Past Surgical History:  Procedure Laterality Date  .  ABLATION  03/10/2014   RFCA of left posterior septal accessory pathyway by Dr Lovena Le with resultant atrial fibrillation  . SUPRAVENTRICULAR TACHYCARDIA ABLATION N/A 03/10/2014   Procedure: SUPRAVENTRICULAR TACHYCARDIA ABLATION;  Surgeon: Evans Lance, MD;  Location: [facility] CATH LAB;  Service: Cardiovascular;  Laterality: N/A;     Family History  Problem Relation Age of Onset  . Diabetes Maternal Grandmother   . Colon cancer Neg Hx   . Esophageal cancer Neg Hx   . Rectal cancer Neg Hx   . Stomach cancer Neg Hx      Social History   Socioeconomic History  . Marital status: Married    Spouse name: Not on file  . Number of children: 3  . Years of education: Not on file  . Highest education level: Not on file  Occupational History  . Occupation: Time Environmental consultant  Social Needs  . Financial resource strain: Not on file  . Food insecurity:    Worry: Not on file    Inability: Not on file  . Transportation needs:    Medical: Not on file    Non-medical: Not on file  Tobacco Use  . Smoking status: Former Smoker    Packs/day: 0.25    Years: 10.00    Pack years: 2.50    Types: Cigarettes  Last attempt to quit: 02/03/2014    Years since quitting: 4.6  . Smokeless tobacco: Never Used  Substance and Sexual Activity  . Alcohol use: No    Alcohol/week: 0.0 standard drinks    Comment: 03/10/2014 "might have a few drinks 1-2 times/yr, if that"  . Drug use: No  . Sexual activity: Yes  Lifestyle  . Physical activity:    Days per week: Not on file    Minutes per session: Not on file  . Stress: Not on file  Relationships  . Social connections:    Talks on phone: Not on file    Gets together: Not on file    Attends religious service: Not on file    Active member of club or organization: Not on file    Attends meetings of clubs or organizations: Not on file    Relationship status: Not on file  . Intimate partner violence:    Fear of current or ex partner: Not on file     Emotionally abused: Not on file    Physically abused: Not on file    Forced sexual activity: Not on file  Other Topics Concern  . Not on file  Social History Narrative  . Not on file     BP 122/68   Pulse 73   Ht 6' (1.829 m)   Wt 132 lb (59.9 kg)   SpO2 99%   BMI 17.90 kg/m   Physical Exam:  Well appearing NAD HEENT: Unremarkable Neck:  No JVD, no thyromegally Lymphatics:  No adenopathy Back:  No CVA tenderness Lungs:  Clear with no wheezes HEART:  Regular rate rhythm, no murmurs, no rubs, no clicks Abd:  soft, positive bowel sounds, no organomegally, no rebound, no guarding Ext:  2 plus pulses, no edema, no cyanosis, no clubbing Skin:  No rashes no nodules Neuro:  CN II through XII intact, motor grossly intact  EKG - NSR   Assess/Plan: 1. Palpitations - he is reasonably controlled on medical therapy but appears to be having some rebound of his symptoms. I have recommend he undergo a switch from flecainide to propafenone. 2. HTN - his blood pressure is controlled.  I spent over 25 minutes including 50% face to face time preparing this note.  Mikle Bosworth.D.

## 2018-10-25 ENCOUNTER — Ambulatory Visit: Payer: Self-pay

## 2018-11-06 ENCOUNTER — Other Ambulatory Visit: Payer: Self-pay | Admitting: Internal Medicine

## 2018-12-19 ENCOUNTER — Telehealth: Payer: Self-pay | Admitting: Gastroenterology

## 2018-12-19 NOTE — Telephone Encounter (Signed)
[facility] [place]. He would like a call when this is taken care of.

## 2018-12-20 ENCOUNTER — Other Ambulatory Visit: Payer: Self-pay | Admitting: *Deleted

## 2018-12-20 MED ORDER — ONDANSETRON 4 MG PO TBDP: ORAL_TABLET | ORAL | 0 refills | Status: DC

## 2018-12-20 NOTE — Telephone Encounter (Signed)
Spoke to the patient and per Alonza Bogus PA, It is Ok to send a refill for the ODT Zofran 4 mg. Patient thanked me.

## 2018-12-20 NOTE — Telephone Encounter (Signed)
OK for refill.

## 2019-04-22 ENCOUNTER — Telehealth: Payer: Self-pay | Admitting: Internal Medicine

## 2019-04-22 NOTE — Telephone Encounter (Signed)
°*  STAT* If patient is at the pharmacy, call can be transferred to refill team.   1. Which medications need to be refilled? (please list name of each medication and dose if known)  flecainide (TAMBOCOR) 100 MG tablet  2. Which pharmacy/location (including street and city if local pharmacy) is medication to be sent to?  [id number] - [place],  - [address] AT [place]  3. Do they need a 30 day or 90 day supply? 90  Pt only has 2 doses left

## 2019-04-23 NOTE — Telephone Encounter (Signed)
Called pt to inform him that he has refilled left at his pharmacy and that he needed to call them to request a refill and if he has any other problems, questions or concerns to give our office a call back. Pt verbalized understanding.

## 2019-05-28 ENCOUNTER — Telehealth: Payer: Self-pay | Admitting: Internal Medicine

## 2019-05-28 MED ORDER — ONDANSETRON 4 MG PO TBDP: ORAL_TABLET | ORAL | 0 refills | Status: DC

## 2019-05-28 NOTE — Telephone Encounter (Signed)
Rx sent 

## 2019-05-28 NOTE — Telephone Encounter (Signed)
Pt is requesting a refill for [facility]fr[facility] [address].

## 2019-06-19 ENCOUNTER — Other Ambulatory Visit: Payer: Self-pay | Admitting: *Deleted

## 2019-06-19 DIAGNOSIS — Z20822 Contact with and (suspected) exposure to covid-19: Secondary | ICD-10-CM

## 2019-06-22 LAB — NOVEL CORONAVIRUS, NAA: SARS-CoV-2, NAA: NOT DETECTED

## 2019-07-30 ENCOUNTER — Encounter (HOSPITAL_COMMUNITY): Payer: Self-pay

## 2019-07-30 ENCOUNTER — Inpatient Hospital Stay (HOSPITAL_COMMUNITY)
Admission: EM | Admit: 2019-07-30 | Discharge: 2019-08-01 | DRG: 565 | Disposition: A | Discharge status: Home / Self Care | Payer: 59 | Attending: Family Medicine | Admitting: Family Medicine

## 2019-07-30 ENCOUNTER — Other Ambulatory Visit: Payer: Self-pay

## 2019-07-30 DIAGNOSIS — I10 Essential (primary) hypertension: Secondary | ICD-10-CM | POA: Diagnosis present

## 2019-07-30 DIAGNOSIS — R109 Unspecified abdominal pain: Secondary | ICD-10-CM | POA: Diagnosis present

## 2019-07-30 DIAGNOSIS — Z87891 Personal history of nicotine dependence: Secondary | ICD-10-CM

## 2019-07-30 DIAGNOSIS — Z114 Encounter for screening for human immunodeficiency virus [HIV]: Secondary | ICD-10-CM

## 2019-07-30 DIAGNOSIS — M6282 Rhabdomyolysis: Secondary | ICD-10-CM | POA: Diagnosis present

## 2019-07-30 DIAGNOSIS — Z7251 High risk heterosexual behavior: Secondary | ICD-10-CM

## 2019-07-30 DIAGNOSIS — Z20828 Contact with and (suspected) exposure to other viral communicable diseases: Secondary | ICD-10-CM | POA: Diagnosis present

## 2019-07-30 DIAGNOSIS — I471 Supraventricular tachycardia: Secondary | ICD-10-CM | POA: Diagnosis present

## 2019-07-30 DIAGNOSIS — K449 Diaphragmatic hernia without obstruction or gangrene: Secondary | ICD-10-CM | POA: Diagnosis present

## 2019-07-30 DIAGNOSIS — Z833 Family history of diabetes mellitus: Secondary | ICD-10-CM

## 2019-07-30 DIAGNOSIS — F329 Major depressive disorder, single episode, unspecified: Secondary | ICD-10-CM | POA: Diagnosis present

## 2019-07-30 DIAGNOSIS — R7302 Impaired glucose tolerance (oral): Secondary | ICD-10-CM | POA: Diagnosis present

## 2019-07-30 DIAGNOSIS — E119 Type 2 diabetes mellitus without complications: Secondary | ICD-10-CM

## 2019-07-30 DIAGNOSIS — E860 Dehydration: Secondary | ICD-10-CM

## 2019-07-30 DIAGNOSIS — K219 Gastro-esophageal reflux disease without esophagitis: Secondary | ICD-10-CM | POA: Diagnosis present

## 2019-07-30 DIAGNOSIS — R350 Frequency of micturition: Secondary | ICD-10-CM | POA: Diagnosis present

## 2019-07-30 DIAGNOSIS — Z880 Allergy status to penicillin: Secondary | ICD-10-CM

## 2019-07-30 DIAGNOSIS — G8929 Other chronic pain: Secondary | ICD-10-CM | POA: Diagnosis present

## 2019-07-30 DIAGNOSIS — T796XXA Traumatic ischemia of muscle, initial encounter: Principal | ICD-10-CM | POA: Diagnosis present

## 2019-07-30 DIAGNOSIS — R739 Hyperglycemia, unspecified: Secondary | ICD-10-CM | POA: Diagnosis present

## 2019-07-30 DIAGNOSIS — F410 Panic disorder [episodic paroxysmal anxiety] without agoraphobia: Secondary | ICD-10-CM | POA: Diagnosis present

## 2019-07-30 DIAGNOSIS — R7303 Prediabetes: Secondary | ICD-10-CM | POA: Diagnosis present

## 2019-07-30 LAB — URINALYSIS, ROUTINE W REFLEX MICROSCOPIC
Bilirubin Urine: NEGATIVE
Glucose, UA: NEGATIVE mg/dL
Hgb urine dipstick: NEGATIVE
Ketones, ur: NEGATIVE mg/dL
Leukocytes,Ua: NEGATIVE
Nitrite: NEGATIVE
Protein, ur: NEGATIVE mg/dL
Specific Gravity, Urine: 1.017 (ref 1.005–1.030)
pH: 7 (ref 5.0–8.0)

## 2019-07-30 LAB — BASIC METABOLIC PANEL
Anion gap: 9 (ref 5–15)
BUN: 16 mg/dL (ref 6–20)
CO2: 29 mmol/L (ref 22–32)
Calcium: 9.8 mg/dL (ref 8.9–10.3)
Chloride: 99 mmol/L (ref 98–111)
Creatinine, Ser: 1.07 mg/dL (ref 0.61–1.24)
GFR calc Af Amer: >60 mL/min (ref >60)
GFR calc non Af Amer: >60 mL/min (ref >60)
Glucose, Bld: 146 mg/dL — ABNORMAL HIGH (ref 70–99)
Potassium: 4 mmol/L (ref 3.5–5.1)
Sodium: 137 mmol/L (ref 135–145)

## 2019-07-30 LAB — CBC
HCT: 45.3 % (ref 39.0–52.0)
Hemoglobin: 14.9 g/dL (ref 13.0–17.0)
MCH: 30 pg (ref 26.0–34.0)
MCHC: 32.9 g/dL (ref 30.0–36.0)
MCV: 91.3 fL (ref 80.0–100.0)
Platelets: 275 10*3/uL (ref 150–400)
RBC: 4.96 MIL/uL (ref 4.22–5.81)
RDW: 11.3 % — ABNORMAL LOW (ref 11.5–15.5)
WBC: 4.9 10*3/uL (ref 4.0–10.5)
nRBC: 0 % (ref 0.0–0.2)

## 2019-07-30 LAB — CBG MONITORING, ED: Glucose-Capillary: 159 mg/dL — ABNORMAL HIGH (ref 70–99)

## 2019-07-30 LAB — GLUCOSE, CAPILLARY: Glucose-Capillary: 123 mg/dL — ABNORMAL HIGH (ref 70–99)

## 2019-07-30 LAB — CK: Total CK: 24599 U/L — ABNORMAL HIGH (ref 49–397)

## 2019-07-30 LAB — PHOSPHORUS: Phosphorus: 3.2 mg/dL (ref 2.5–4.6)

## 2019-07-30 MED ORDER — FAMOTIDINE 20 MG PO TABS
10.0000 mg | ORAL_TABLET | Freq: Every day | ORAL | Status: DC
  Administered 2019-07-31: 10 mg via ORAL
  Filled 2019-07-30 (×3): qty 1

## 2019-07-30 MED ORDER — ACETAMINOPHEN 325 MG PO TABS
650.0000 mg | ORAL_TABLET | Freq: Four times a day (QID) | ORAL | Status: DC | PRN
  Administered 2019-07-31: 650 mg via ORAL
  Filled 2019-07-30 (×2): qty 2

## 2019-07-30 MED ORDER — ENOXAPARIN SODIUM 40 MG/0.4ML ~~LOC~~ SOLN
40.0000 mg | Freq: Every day | SUBCUTANEOUS | Status: DC
  Administered 2019-07-31 (×2): 40 mg via SUBCUTANEOUS
  Filled 2019-07-30 (×2): qty 0.4

## 2019-07-30 MED ORDER — FLECAINIDE ACETATE 50 MG PO TABS
50.0000 mg | ORAL_TABLET | ORAL | Status: DC
  Filled 2019-07-30: qty 1

## 2019-07-30 MED ORDER — SODIUM CHLORIDE 0.9 % IV BOLUS
1000.0000 mL | Freq: Once | INTRAVENOUS | Status: AC
  Administered 2019-07-30: 20:00:00 1000 mL via INTRAVENOUS

## 2019-07-30 MED ORDER — INSULIN ASPART 100 UNIT/ML ~~LOC~~ SOLN
0.0000 [IU] | Freq: Three times a day (TID) | SUBCUTANEOUS | Status: DC
  Filled 2019-07-30: qty 0.09

## 2019-07-30 MED ORDER — SODIUM CHLORIDE 0.9 % IV SOLN
INTRAVENOUS | Status: AC
  Administered 2019-07-31 (×2): via INTRAVENOUS

## 2019-07-30 MED ORDER — ACETAMINOPHEN 650 MG RE SUPP: 650.0000 mg | Freq: Four times a day (QID) | RECTAL | Status: DC | PRN

## 2019-07-30 MED ORDER — LORAZEPAM 0.5 MG PO TABS
0.5000 mg | ORAL_TABLET | Freq: Every day | ORAL | Status: DC | PRN
  Filled 2019-07-30: qty 1

## 2019-07-30 MED ORDER — SODIUM CHLORIDE 0.9 % IV BOLUS
1000.0000 mL | Freq: Once | INTRAVENOUS | Status: AC
  Administered 2019-07-30: 1000 mL via INTRAVENOUS

## 2019-07-30 NOTE — H&P (Signed)
History and Physical    Anthony Henderson [id number] DOB: 1981/06/15 DOA: 07/30/2019  PCP: Patient, No Pcp Per Patient coming from: Home  Chief Complaint: Dizziness and hyperglycemia  HPI: Anthony Henderson is a 38 y.o. male with medical history significant of prediabetes, atrial fibrillation, SVT, anxiety, depression, GERD presenting to the hospital with a chief complaint of dizziness and hyperglycemia.  Patient states he has been feeling dizzy and urinating a lot recently.  States he has borderline diabetes and is not on any medications.  Today his wife checked his blood sugar and it was 300.  States recently he hired a Physiological scientist and has been undergoing intense outdoor exercise sessions 3 times a week which left him with very sore muscles in his arms and thighs.  A few days ago he could barely move because his muscles were so sore.  The soreness has now improved.  No other complaints.  ED Course: Hemodynamically stable.  Blood glucose 146.  CK 24,599.  Renal function at baseline.  Calcium normal.  2 L fluid boluses ordered in the ED. COVID-19 test pending.  Review of Systems:  All systems reviewed and apart from history of presenting illness, are negative.  Past Medical History:  Diagnosis Date  . Abdominal pain   . Anxiety    "related to SVT"  . Atrial fibrillation ([place])    following RFCA of SVT 03/2014 - on Flecainide  . Depression   . GERD (gastroesophageal reflux disease)   . Hiatal hernia   . High risk sexual behavior   . Other malaise and fatigue   . Panic attack   . Post-operative nausea and vomiting   . SVT (supraventricular tachycardia) [facility])    s/p ablation 03-10-14 by Dr Lovena Le    Past Surgical History:  Procedure Laterality Date  . ABLATION  03/10/2014   RFCA of left posterior septal accessory pathyway by Dr Lovena Le with resultant atrial fibrillation  . SUPRAVENTRICULAR TACHYCARDIA ABLATION N/A 03/10/2014   Procedure: SUPRAVENTRICULAR TACHYCARDIA ABLATION;   Surgeon: Evans Lance, MD;  Location: [facility] CATH LAB;  Service: Cardiovascular;  Laterality: N/A;     reports that he quit smoking about 5 years ago. His smoking use included cigarettes. He has a 2.50 pack-year smoking history. He has never used smokeless tobacco. He reports that he does not drink alcohol or use drugs.  Allergies  Allergen Reactions  . Penicillins Hives    Family History  Problem Relation Age of Onset  . Diabetes Maternal Grandmother   . Colon cancer Neg Hx   . Esophageal cancer Neg Hx   . Rectal cancer Neg Hx   . Stomach cancer Neg Hx     Prior to Admission medications   Medication Sig Start Date End Date Taking? Authorizing Provider  famotidine (PEPCID) 10 MG tablet Take 10 mg by mouth daily.   Yes [provider]  flecainide (TAMBOCOR) 100 MG tablet Take 0.5 tablets (50 mg total) by mouth daily. Patient taking differently: Take 50 mg by mouth 2 (two) times a week.  11/06/18  Yes Evans Lance, MD  ondansetron (ZOFRAN ODT) 4 MG disintegrating tablet Dissolve 1 tablet on the tongue daily as needed for nausea. 05/28/19   Pyrtle, Lajuan Lines, MD  propafenone (RYTHMOL) 225 MG tablet Take 1 tablet (225 mg total) by mouth 2 (two) times daily. Patient not taking: Reported on 07/30/2019 10/12/18   Evans Lance, MD    Physical Exam: Vitals:   07/30/19 1900 07/30/19 1930  07/30/19 2000 07/30/19 2034  BP: 102/78 109/76 97/65 128/83  Pulse: 74 64 (!) 56 78  Resp: 19 19 15 18   Temp:      TempSrc:      SpO2: 100% 100% 100% 100%    Physical Exam  Constitutional: He is oriented to person, place, and time. He appears well-developed and well-nourished. No distress.  HENT:  Head: Normocephalic.  Mouth/Throat: Oropharynx is clear and moist.  Eyes: EOM are normal. Right eye exhibits no discharge. Left eye exhibits no discharge.  Neck: Neck supple.  Cardiovascular: Normal rate, regular rhythm and intact distal pulses.  Pulmonary/Chest: Effort normal and breath sounds  normal. No respiratory distress. He has no wheezes. He has no rales.  Abdominal: Soft. Bowel sounds are normal. He exhibits no distension. There is no abdominal tenderness. There is no guarding.  Musculoskeletal:        General: No edema.     Comments: Strength 5 out of 5 in bilateral upper and lower extremities. Muscles of bilateral upper and lower extremities are soft and without tenderness.  Neurological: He is alert and oriented to person, place, and time.  Skin: Skin is warm and dry. He is not diaphoretic.     Labs on Admission: I have personally reviewed following labs and imaging studies  CBC: Recent Labs  Lab 07/30/19 1417  WBC 4.9  HGB 14.9  HCT 45.3  MCV 91.3  PLT 123XX123   Basic Metabolic Panel: Recent Labs  Lab 07/30/19 1417  NA 137  K 4.0  CL 99  CO2 29  GLUCOSE 146*  BUN 16  CREATININE 1.07  CALCIUM 9.8   GFR: CrCl cannot be calculated (Unknown ideal weight.). Liver Function Tests: No results for input(s): AST, ALT, ALKPHOS, BILITOT, PROT, ALBUMIN in the last 168 hours. No results for input(s): LIPASE, AMYLASE in the last 168 hours. No results for input(s): AMMONIA in the last 168 hours. Coagulation Profile: No results for input(s): INR, PROTIME in the last 168 hours. Cardiac Enzymes: Recent Labs  Lab 07/30/19 1417  CKTOTAL 24,599*   BNP (last 3 results) No results for input(s): PROBNP in the last 8760 hours. HbA1C: No results for input(s): HGBA1C in the last 72 hours. CBG: Recent Labs  Lab 07/30/19 1402  GLUCAP 159*   Lipid Profile: No results for input(s): CHOL, HDL, LDLCALC, TRIG, CHOLHDL, LDLDIRECT in the last 72 hours. Thyroid Function Tests: No results for input(s): TSH, T4TOTAL, FREET4, T3FREE, THYROIDAB in the last 72 hours. Anemia Panel: No results for input(s): VITAMINB12, FOLATE, FERRITIN, TIBC, IRON, RETICCTPCT in the last 72 hours. Urine analysis:    Component Value Date/Time   COLORURINE YELLOW 07/30/2019 1626    APPEARANCEUR CLEAR 07/30/2019 1626   LABSPEC 1.017 07/30/2019 1626   PHURINE 7.0 07/30/2019 1626   GLUCOSEU NEGATIVE 07/30/2019 1626   HGBUR NEGATIVE 07/30/2019 1626   BILIRUBINUR NEGATIVE 07/30/2019 1626   KETONESUR NEGATIVE 07/30/2019 1626   PROTEINUR NEGATIVE 07/30/2019 1626   UROBILINOGEN 1.0 10/29/2014 1929   NITRITE NEGATIVE 07/30/2019 1626   LEUKOCYTESUR NEGATIVE 07/30/2019 1626    Radiological Exams on Admission: No results found.  EKG: Independently reviewed.  Normal sinus rhythm.  Assessment/Plan Principal Problem:   Rhabdomyolysis Active Problems:   PSVT (paroxysmal supraventricular tachycardia) (HCC)   Esophageal reflux   Prediabetes   Encounter for screening for HIV   Exertional rhabdomyolysis CK 24,599.  Renal function at baseline.  Calcium level normal. -Received 2 L IV fluid boluses in the ED.  Continue normal  saline at 250 cc/h. -Trend CK -Continue to monitor renal function -Check phosphorus level  Prediabetes Blood glucose 146. -Check A1c.  Sliding scale insulin sensitive with meals and CBG checks.  PSVT, hx of Afib Currently in sinus rhythm.  Followed by cardiology, last office visit in November 2019.  Per note from this office visit patient was supposed to stop flecainide and instead take propafenone.  However, patient continues to take flecainide as needed/twice weekly.  He is not taking propafenone. -Continue his current home dose of flecainide.  He will need cardiology follow-up.  HIV screening The patient falls between the ages of 13-64 and should be screened for HIV, therefore HIV testing ordered.  GERD -Continue H2 blocker  Anxiety Patient states he takes Ativan 0.5 mg at home. -Continue Ativan 0.5 mg daily as needed for anxiety  DVT prophylaxis: Lovenox Code Status: Full code Family Communication: No family available at this time. Disposition Plan: Anticipate discharge in 1 to 2 days. Consults called: None Admission status: It is my  clinical opinion that referral for OBSERVATION is reasonable and necessary in this patient based on the above information provided. The aforementioned taken together are felt to place the patient at high risk for further clinical deterioration. However it is anticipated that the patient may be medically stable for discharge from the hospital within 24 to 48 hours.  The medical decision making on this patient was of high complexity and the patient is at high risk for clinical deterioration, therefore this is a level 3 visit.  Shela Leff MD [facility] Pager [phone]  If 7PM-7AM, please contact night-coverage [email or web address] Password [facility]  07/30/2019, 8:49 PM

## 2019-07-30 NOTE — ED Triage Notes (Signed)
Patient states he has been feeling light headed, hungry, and frequent urination.   Patient states his wife works at [employer] and CBG was over 300 about 30 min.     CBG-159  A/Ox4 Ambulatory in triage.   Hx. Borderline DM

## 2019-07-30 NOTE — ED Notes (Signed)
ED TO INPATIENT HANDOFF REPORT  Name/Age/Gender Anthony Henderson 38 y.o. male  Code Status    Code Status Orders  (From admission, onward)         Start     Ordered   07/30/19 2041  Full code  Continuous     07/30/19 2041        Code Status History    Date Active Date Inactive Code Status Order ID Comments User Context   03/10/2014 1227 03/11/2014 1531 Full Code [id number]  Evans Lance, MD Inpatient   Advance Care Planning Activity      Home/SNF/Other Home  Chief Complaint dizzy nausea  possible hyperglycemia   Level of Care/Admitting Diagnosis ED Disposition    ED Disposition Condition [facility] Area: [facility] [[id number]]  Level of Care: Med-Surg [16]  Covid Evaluation: Asymptomatic Screening Protocol (No Symptoms)  Diagnosis: Rhabdomyolysis [728.88.ICD-9-CM]  Admitting Physician: Shela Leff [id number]  Attending Physician: Shela Leff [id number]  PT Class (Do Not Modify): Observation [104]  PT Acc Code (Do Not Modify): Observation [10022]       Medical History Past Medical History:  Diagnosis Date  . Abdominal pain   . Anxiety    "related to SVT"  . Atrial fibrillation ([place])    following RFCA of SVT 03/2014 - on Flecainide  . Depression   . GERD (gastroesophageal reflux disease)   . Hiatal hernia   . High risk sexual behavior   . Other malaise and fatigue   . Panic attack   . Post-operative nausea and vomiting   . SVT (supraventricular tachycardia) (HCC)    s/p ablation 03-10-14 by Dr Lovena Le    Allergies Allergies  Allergen Reactions  . Penicillins Hives    IV Location/Drains/Wounds Patient Lines/Drains/Airways Status   Active Line/Drains/Airways    Name:   Placement date:   Placement time:   Site:   Days:   Peripheral IV 07/30/19 Right Forearm   07/30/19    2026    Forearm   less than 1          Labs/Imaging Results for orders placed or performed during the hospital encounter of  07/30/19 (from the past 48 hour(s))  CBG monitoring, ED     Status: Abnormal   Collection Time: 07/30/19  2:02 PM  Result Value Ref Range   Glucose-Capillary 159 (H) 70 - 99 mg/dL  Basic metabolic panel     Status: Abnormal   Collection Time: 07/30/19  2:17 PM  Result Value Ref Range   Sodium 137 135 - 145 mmol/L   Potassium 4.0 3.5 - 5.1 mmol/L   Chloride 99 98 - 111 mmol/L   CO2 29 22 - 32 mmol/L   Glucose, Bld 146 (H) 70 - 99 mg/dL   BUN 16 6 - 20 mg/dL   Creatinine, Ser 1.07 0.61 - 1.24 mg/dL   Calcium 9.8 8.9 - 10.3 mg/dL   GFR calc non Af Amer >60 >60 mL/min   GFR calc Af Amer >60 >60 mL/min   Anion gap 9 5 - 15    Comment: Performed at [facility], [place] [address], [place], [place] [address]  CBC     Status: Abnormal   Collection Time: 07/30/19  2:17 PM  Result Value Ref Range   WBC 4.9 4.0 - 10.5 K/uL   RBC 4.96 4.22 - 5.81 MIL/uL   Hemoglobin 14.9 13.0 - 17.0 g/dL   HCT 45.3 39.0 - 52.0 %  MCV 91.3 80.0 - 100.0 fL   MCH 30.0 26.0 - 34.0 pg   MCHC 32.9 30.0 - 36.0 g/dL   RDW 11.3 (L) 11.5 - 15.5 %   Platelets 275 150 - 400 K/uL   nRBC 0.0 0.0 - 0.2 %    Comment: Performed at [facility], [place] [address], [place], [place] [address]  CK     Status: Abnormal   Collection Time: 07/30/19  2:17 PM  Result Value Ref Range   Total CK 24,599 (H) 49 - 397 U/L    Comment: RESULTS CONFIRMED BY MANUAL DILUTION Performed at [facility], [place] [address] [place], [place] [address]   Urinalysis, Routine w reflex microscopic     Status: None   Collection Time: 07/30/19  4:26 PM  Result Value Ref Range   Color, Urine YELLOW YELLOW   APPearance CLEAR CLEAR   Specific Gravity, Urine 1.017 1.005 - 1.030   pH 7.0 5.0 - 8.0   Glucose, UA NEGATIVE NEGATIVE mg/dL   Hgb urine dipstick NEGATIVE NEGATIVE   Bilirubin Urine NEGATIVE NEGATIVE   Ketones, ur NEGATIVE NEGATIVE mg/dL   Protein, ur NEGATIVE NEGATIVE mg/dL   Nitrite  NEGATIVE NEGATIVE   Leukocytes,Ua NEGATIVE NEGATIVE    Comment: Performed at [place] [address], [place],  [address]   No results found.  Pending Labs Unresulted Labs (From admission, onward)    Start     Ordered   07/31/19 0500  Hemoglobin A1c  Tomorrow morning,   R    Comments: To assess prior glycemic control    07/30/19 2041   07/31/19 [id number]  Basic metabolic panel  Tomorrow morning,   R     07/30/19 2041   07/31/19 0500  CK  Tomorrow morning,   R     07/30/19 2041   07/30/19 2042  Phosphorus  Add-on,   AD     07/30/19 2041   07/30/19 2040  HIV antibody (Routine Testing)  Once,   STAT     07/30/19 2041   07/30/19 2014  SARS CORONAVIRUS 2 (TAT 6-12 HRS) Nasal Swab Aptima Multi Swab  (Asymptomatic/Tier 2 Patients Labs)  Once,   STAT    Question Answer Comment  Is this test for diagnosis or screening Screening   Symptomatic for COVID-19 as defined by CDC No   Hospitalized for COVID-19 No   Admitted to ICU for COVID-19 No   Previously tested for COVID-19 Unknown   Resident in a congregate (group) care setting Unknown   Employed in healthcare setting Unknown      07/30/19 2014          Vitals/Pain Today's Vitals   07/30/19 2034 07/30/19 2120 07/30/19 2130 07/30/19 2200  BP: 128/83 120/79 123/77 112/76  Pulse: 78 69 67 64  Resp: 18 18  20   Temp:      TempSrc:      SpO2: 100% 100% 100% 100%  PainSc:        Isolation Precautions No active isolations  Medications Medications  flecainide (TAMBOCOR) tablet 50 mg (has no administration in time range)  famotidine (PEPCID) tablet 10 mg (10 mg Oral Refused 07/30/19 2127)  enoxaparin (LOVENOX) injection 40 mg (has no administration in time range)  acetaminophen (TYLENOL) tablet 650 mg (has no administration in time range)    Or  acetaminophen (TYLENOL) suppository 650 mg (has no administration in time range)  0.9 %  sodium chloride infusion (has no  administration in time range)   insulin aspart (novoLOG) injection 0-9 Units (has no administration in time range)  LORazepam (ATIVAN) tablet 0.5 mg (has no administration in time range)  sodium chloride 0.9 % bolus 1,000 mL (0 mLs Intravenous Stopped 07/30/19 2142)  sodium chloride 0.9 % bolus 1,000 mL (0 mLs Intravenous Stopped 07/30/19 2142)    Mobility walks

## 2019-07-30 NOTE — ED Provider Notes (Signed)
[facility] Emergency Department Provider Note MRN:  [id number]  Arrival date & time: 07/30/19     Chief Complaint   Dizziness and Hyperglycemia   History of Present Illness   Anthony Henderson is a 38 y.o. year-old male with a history of borderline diabetes presenting to the ED with chief complaint of dizziness and hyperglycemia.  The past several days patient has been feeling low energy, lightheaded, frequent urination, urinated every hour last night.  Denies hematuria or dysuria.  Patient has had a new exercise routine for the past 3 weeks with a personal trainer, trying to gain muscle.  Denies fever, no cough, no chest pain or shortness of breath, no abdominal pain.  Review of Systems  A complete 10 system review of systems was obtained and all systems are negative except as noted in the HPI and PMH.   Patient's Health History    Past Medical History:  Diagnosis Date  . Abdominal pain   . Anxiety    "related to SVT"  . Atrial fibrillation ([place])    following RFCA of SVT 03/2014 - on Flecainide  . Depression   . GERD (gastroesophageal reflux disease)   . Hiatal hernia   . High risk sexual behavior   . Other malaise and fatigue   . Panic attack   . Post-operative nausea and vomiting   . SVT (supraventricular tachycardia) [facility])    s/p ablation 03-10-14 by Dr Lovena Le    Past Surgical History:  Procedure Laterality Date  . ABLATION  03/10/2014   RFCA of left posterior septal accessory pathyway by Dr Lovena Le with resultant atrial fibrillation  . SUPRAVENTRICULAR TACHYCARDIA ABLATION N/A 03/10/2014   Procedure: SUPRAVENTRICULAR TACHYCARDIA ABLATION;  Surgeon: Evans Lance, MD;  Location: [facility] CATH LAB;  Service: Cardiovascular;  Laterality: N/A;    Family History  Problem Relation Age of Onset  . Diabetes Maternal Grandmother   . Colon cancer Neg Hx   . Esophageal cancer Neg Hx   . Rectal cancer Neg Hx   . Stomach cancer Neg Hx     Social History    Socioeconomic History  . Marital status: Married    Spouse name: Not on file  . Number of children: 3  . Years of education: Not on file  . Highest education level: Not on file  Occupational History  . Occupation: Time Environmental consultant  Social Needs  . Financial resource strain: Not on file  . Food insecurity    Worry: Not on file    Inability: Not on file  . Transportation needs    Medical: Not on file    Non-medical: Not on file  Tobacco Use  . Smoking status: Former Smoker    Packs/day: 0.25    Years: 10.00    Pack years: 2.50    Types: Cigarettes    Quit date: 02/03/2014    Years since quitting: 5.4  . Smokeless tobacco: Never Used  Substance and Sexual Activity  . Alcohol use: No    Alcohol/week: 0.0 standard drinks    Comment: 03/10/2014 "might have a few drinks 1-2 times/yr, if that"  . Drug use: No  . Sexual activity: Yes  Lifestyle  . Physical activity    Days per week: Not on file    Minutes per session: Not on file  . Stress: Not on file  Relationships  . Social Herbalist on phone: Not on file    Gets together: Not on  file    Attends religious service: Not on file    Active member of club or organization: Not on file    Attends meetings of clubs or organizations: Not on file    Relationship status: Not on file  . Intimate partner violence    Fear of current or ex partner: Not on file    Emotionally abused: Not on file    Physically abused: Not on file    Forced sexual activity: Not on file  Other Topics Concern  . Not on file  Social History Narrative  . Not on file     Physical Exam  Vital Signs and Nursing Notes reviewed Vitals:   07/30/19 1900 07/30/19 1930  BP: 102/78 109/76  Pulse: 74 64  Resp: 19 19  Temp:    SpO2: 100% 100%    CONSTITUTIONAL: Well-appearing, NAD NEURO:  Alert and oriented x 3, no focal deficits EYES:  eyes equal and reactive ENT/NECK:  no LAD, no JVD CARDIO: Regular rate, well-perfused, normal S1 and  S2 PULM:  CTAB no wheezing or rhonchi GI/GU:  normal bowel sounds, non-distended, non-tender MSK/SPINE:  No gross deformities, no edema SKIN:  no rash, atraumatic PSYCH:  Appropriate speech and behavior  Diagnostic and Interventional Summary    EKG Interpretation  Date/Time:  Tuesday July 30 2019 17:39:16 EDT Ventricular Rate:  65 PR Interval:    QRS Duration: 85 QT Interval:  405 QTC Calculation: 422 R Axis:   81 Text Interpretation:  Sinus rhythm Confirmed by Gerlene Fee [phone]) on 07/30/2019 7:21:25 PM      Labs Reviewed  BASIC METABOLIC PANEL - Abnormal; Notable for the following components:      Result Value   Glucose, Bld 146 (*)    All other components within normal limits  CBC - Abnormal; Notable for the following components:   RDW 11.3 (*)    All other components within normal limits  CK - Abnormal; Notable for the following components:   Total CK 24,599 (*)    All other components within normal limits  CBG MONITORING, ED - Abnormal; Notable for the following components:   Glucose-Capillary 159 (*)    All other components within normal limits  SARS CORONAVIRUS 2 (TAT 6-12 HRS)  URINALYSIS, ROUTINE W REFLEX MICROSCOPIC  CBG MONITORING, ED    No orders to display    Medications  sodium chloride 0.9 % bolus 1,000 mL (has no administration in time range)  sodium chloride 0.9 % bolus 1,000 mL (has no administration in time range)     Procedures Critical Care  ED Course and Medical Decision Making  I have reviewed the triage vital signs and the nursing notes.  Pertinent labs & imaging results that were available during my care of the patient were reviewed by me and considered in my medical decision making (see below for details).  Suspect dehydration, possibly related to hyperglycemia and also related to recent increased activity level.  Also considering rhabdomyolysis, DKA.  Labs are reassuring with mild hyperglycemia, no evidence of DKA.  Given the  lightheadedness will screen with EKG, still awaiting CK level.  CK markedly elevated at 24,000, will provide 2 L crystalloid and request admission for continued hydration.  Barth Kirks. Sedonia Small, MD [place] [email or web address]  Final Clinical Impressions(s) / ED Diagnoses     ICD-10-CM   1. Dehydration  E86.0   2. Non-traumatic rhabdomyolysis  M62.82     ED Discharge Orders  None      Discharge Instructions Discussed with and Provided to Patient: Discharge Instructions   None       Maudie Flakes, MD 07/30/19 2015

## 2019-07-30 NOTE — ED Notes (Signed)
Care handoff received from Clarene Reamer, RN

## 2019-07-31 DIAGNOSIS — M6282 Rhabdomyolysis: Secondary | ICD-10-CM | POA: Diagnosis not present

## 2019-07-31 LAB — BASIC METABOLIC PANEL
Anion gap: 6 (ref 5–15)
BUN: 12 mg/dL (ref 6–20)
CO2: 25 mmol/L (ref 22–32)
Calcium: 8.3 mg/dL — ABNORMAL LOW (ref 8.9–10.3)
Chloride: 104 mmol/L (ref 98–111)
Creatinine, Ser: 0.85 mg/dL (ref 0.61–1.24)
GFR calc Af Amer: >60 mL/min (ref >60)
GFR calc non Af Amer: >60 mL/min (ref >60)
Glucose, Bld: 135 mg/dL — ABNORMAL HIGH (ref 70–99)
Potassium: 3.8 mmol/L (ref 3.5–5.1)
Sodium: 135 mmol/L (ref 135–145)

## 2019-07-31 LAB — SARS CORONAVIRUS 2 (TAT 6-24 HRS): SARS Coronavirus 2: NEGATIVE

## 2019-07-31 LAB — GLUCOSE, CAPILLARY
Glucose-Capillary: 111 mg/dL — ABNORMAL HIGH (ref 70–99)
Glucose-Capillary: 85 mg/dL (ref 70–99)
Glucose-Capillary: 154 mg/dL — ABNORMAL HIGH (ref 70–99)
Glucose-Capillary: 85 mg/dL (ref 70–99)

## 2019-07-31 LAB — HEMOGLOBIN A1C
Hgb A1c MFr Bld: 6.1 % — ABNORMAL HIGH (ref 4.8–5.6)
Mean Plasma Glucose: 128.37 mg/dL

## 2019-07-31 LAB — CK: Total CK: 13512 U/L — ABNORMAL HIGH (ref 49–397)

## 2019-07-31 LAB — HIV ANTIBODY (ROUTINE TESTING W REFLEX): HIV Screen 4th Generation wRfx: NONREACTIVE

## 2019-07-31 MED ORDER — SODIUM CHLORIDE 0.9 % IV SOLN
INTRAVENOUS | Status: AC
  Administered 2019-07-31: 12:00:00 via INTRAVENOUS

## 2019-07-31 NOTE — Plan of Care (Signed)

## 2019-07-31 NOTE — Progress Notes (Addendum)
PROGRESS NOTE  Anthony Henderson [id number] DOB: 1981/06/03 DOA: 07/30/2019 PCP: Patient, No Pcp Per  Brief History   38 year old male SVT status post catheter ablation 2015-Supposed to be on propafenone per last cardiology note 10/12/2018 HTN Chronic abdominal pain followed by gastroenterology status post EGD, GES, ultrasound, HIDA all negative except small hiatal hernia?  GERD Depression Borderline diabetic Admitted with dizziness hyperglycemia and found to have in the emergency room on 8/25 exertional related rhabdomyolysis initial CK 24,599   A & P  Traumatic rhabdomyolysis Continue IV fluids cut back rate to 150 cc an hour --- in order to prevent ATN and keep well hydrated he will need IV fluids at this rate for the next 24 or possibly 48 hours Need to see at least 50 to 75% drop in his CK with hydration over this period of time to be comfortable with discharge home He is aware to contact Dr. Ouida Sills for repeat CK within 2 to 3 days of discharge  Impaired glucose tolerance A1c 6.1 Long discussion with patient about blood sugars Have counseled him regarding metformin and the use of the same-sugar trends are 111-1 30-May benefit from outpatient discussing with Dr. Ouida Sills at [facility] who he has seen in the past He is willing to do this as an outpatient-may benefit from a meter  SVT status post catheter ablation-stable at this time is in sinus rhythm on exam-is taking his flecainide 50 mg twice weekly and has been taking that at his preference-we will need outpatient cardiology input  Chronic abdominal pain?  GERD  Depression  DVT prophylaxis: Lovenox Code Status: Full Family Communication: None Disposition Plan: Likely discharge a.m.   Verneita Griffes, MD [facility] 9:23 AM  07/31/2019, 9:23 AM  LOS: 0 days   Consultants  . None  Procedures  . No  Antibiotics  . No  Interval History/Subjective  Feels much better muscle aches and pains in upper arms have  significantly diminished Asking when he can go home Passing good amounts of urine   Objective   Vitals:  Vitals:   07/30/19 2246 07/31/19 0546  BP: 101/72 109/61  Pulse: 64 (!) 53  Resp: 16   Temp: 98.4 F (36.9 C) 98 F (36.7 C)  SpO2: 100% 100%    Exam:  Awake coherent pleasant no distress EOMI NCAT Chest clear S1-S2 sinus rhythm no added sound Abdomen soft nontender no rebound no guarding No lower extremity edema Multiple tattoos   I have personally reviewed the following:   Today's Data  .   Lab Data  . BUNs/creatinine 12/0.8 . CK down from 24,000-13,512 . A1c 6.1  Micro Data  . None  Imaging  . No  Cardiology Data  . None  Other Data    Scheduled Meds: . enoxaparin (LOVENOX) injection  40 mg Subcutaneous QHS  . famotidine  10 mg Oral Daily  . [START ON 08/01/2019] flecainide  50 mg Oral 2 times weekly  . insulin aspart  0-9 Units Subcutaneous TID WC   Continuous Infusions:  Principal Problem:   Rhabdomyolysis Active Problems:   PSVT (paroxysmal supraventricular tachycardia) (HCC)   Esophageal reflux   Prediabetes   Encounter for screening for HIV   LOS: 0 days   How to contact the [facility] Attending or Consulting provider [place] or covering provider during after hours [place], for this patient?  1. Check the care team in [facility] and look for a) attending/consulting [facility] provider listed and b) the [facility] team listed  2. Log into [email or web address] and use [place]'s universal password to access. If you do not have the password, please contact the hospital operator. 3. Locate the [facility] provider you are looking for under [facility] and page to a number that you can be directly reached. 4. If you still have difficulty reaching the provider, please page the [facility] (Director on Call) for the Hospitalists listed on amion for assistance.

## 2019-08-01 DIAGNOSIS — M6282 Rhabdomyolysis: Secondary | ICD-10-CM | POA: Diagnosis not present

## 2019-08-01 LAB — GLUCOSE, CAPILLARY: Glucose-Capillary: 82 mg/dL (ref 70–99)

## 2019-08-01 LAB — RENAL FUNCTION PANEL
Albumin: 3.3 g/dL — ABNORMAL LOW (ref 3.5–5.0)
Anion gap: 6 (ref 5–15)
BUN: 9 mg/dL (ref 6–20)
CO2: 24 mmol/L (ref 22–32)
Calcium: 8.4 mg/dL — ABNORMAL LOW (ref 8.9–10.3)
Chloride: 107 mmol/L (ref 98–111)
Creatinine, Ser: 0.87 mg/dL (ref 0.61–1.24)
GFR calc Af Amer: >60 mL/min (ref >60)
GFR calc non Af Amer: >60 mL/min (ref >60)
Glucose, Bld: 81 mg/dL (ref 70–99)
Phosphorus: 3.3 mg/dL (ref 2.5–4.6)
Potassium: 3.9 mmol/L (ref 3.5–5.1)
Sodium: 137 mmol/L (ref 135–145)

## 2019-08-01 LAB — CK: Total CK: 7073 U/L — ABNORMAL HIGH (ref 49–397)

## 2019-08-01 MED ORDER — BLOOD GLUCOSE MONITOR KIT: PACK | 0 refills | Status: DC

## 2019-08-01 NOTE — Discharge Summary (Signed)
Physician Discharge Summary  Anthony Henderson XNA:[id number] DOB: 1981-09-16 DOA: 07/30/2019  PCP: Patient, No Pcp Per  Admit date: 07/30/2019 Discharge date: 08/01/2019  Time spent: 25 minutes  Recommendations for Outpatient Follow-up:  1. Needs further diet modifications as an outpatient 2. Recommend A1c in 3 months, recommend be met CBC and CK level in 2 to 3 days patient has an appointment on 8/31 with PCP to discuss this  Discharge Diagnoses:  Principal Problem:   Rhabdomyolysis Active Problems:   PSVT (paroxysmal supraventricular tachycardia) ([place])   Esophageal reflux   Prediabetes   Encounter for screening for HIV   Discharge Condition: Much improved  Diet recommendation: Low-salt low sugar  Filed Weights   07/31/19 0546  Weight: 63.5 kg    History of present illness:  38 year old male SVT status post catheter ablation 2015-Supposed to be on propafenone per last cardiology note 10/12/2018 HTN Chronic abdominal pain followed by gastroenterology status post EGD, GES, ultrasound, HIDA all negative except small hiatal hernia?  GERD Depression Borderline diabetic Admitted with dizziness hyperglycemia and found to have in the emergency room on 8/25 exertional related rhabdomyolysis initial [place] Hospital Course:  Traumatic rhabdomyolysis Was on copious IV fluids 150 cc an hour and his CK dropped from a peak of [id number] on discharge-he is aware after long discussions to get repeat CK in 2 to 3 days post discharge  Impaired glucose tolerance A1c 6.1 Long discussions with patient about blood sugar ordering glucometer holding on addition of any meds at this time he will try dietary route first-he shares with me he has been trying to bulk up in addition to eat more and I have explained to him that doing so will change his metabolism and may be deleterious to his blood sugar I discussed this with his wife who is a nurse as well and she understands and comprehends He will  check his sugars biweekly and report this back to Dr. Ouida Sills  SVT status post catheter ablation-patient takes his flecainide twice weekly only and is not on propafenone he will follow-up as an outpatient with Dr. Lovena Le I will CC him on the same  His chronic abdominal pain reflux depression were all stable on discharge   Discharge Exam: Vitals:   07/31/19 2057 08/01/19 0456  BP: 124/87 107/68  Pulse: (!) 55 (!) 49  Resp: 18 15  Temp: 98.2 F (36.8 C) 98 F (36.7 C)  SpO2: 100% 99%    General: Awake alert pleasant no distress flat affect coherent no muscle pain at this time Cardiovascular: S1-S2 no murmur rub or gallop Respiratory: Clinically clear no added sound Abdomen soft No tenderness to any of his major muscle groups  Discharge Instructions   Discharge Instructions    Diet - low sodium heart healthy   Complete by: As directed    Discharge instructions   Complete by: As directed    Please ensure that you get follow-up with Dr. Ouida Sills in the outpatient setting with regards to her diabetes mellitus and borderline sugars-I have not prescribed any blood sugar medications at this time but we will prescribe you a meter to monitor your sugar at least 2-3 times a week-if you notice that your sugars are over 200 you may require at least 1-2 oral medications As we discussed, it is not probably in your best interest to put on weight-this may exacerbate or worsen your blood sugar control so be careful with diet in addition to training and body building as this  may affect your metabolism Ensure that you follow-up once again to get lab work done see that your muscle injury enzymes are going down   Increase activity slowly   Complete by: As directed      Allergies as of 08/01/2019      Reactions   Penicillins Hives      Medication List    STOP taking these medications   propafenone 225 MG tablet Commonly known as: RYTHMOL     TAKE these medications   blood glucose meter  kit and supplies Kit Dispense based on patient and insurance preference. Use up to four times daily as directed. (FOR ICD-9 250.00, 250.01).   famotidine 10 MG tablet Commonly known as: PEPCID Take 10 mg by mouth daily.   flecainide 100 MG tablet Commonly known as: TAMBOCOR Take 0.5 tablets (50 mg total) by mouth daily. What changed: when to take this   ondansetron 4 MG disintegrating tablet Commonly known as: Zofran ODT Dissolve 1 tablet on the tongue daily as needed for nausea.      Allergies  Allergen Reactions  . Penicillins Hives      The results of significant diagnostics from this hospitalization (including imaging, microbiology, ancillary and laboratory) are listed below for reference.    Significant Diagnostic Studies: No results found.  Microbiology: Recent Results (from the past 240 hour(s))  SARS CORONAVIRUS 2 (TAT 6-12 HRS) Nasal Swab Aptima Multi Swab     Status: None   Collection Time: 07/30/19  8:14 PM   Specimen: Aptima Multi Swab; Nasal Swab  Result Value Ref Range Status   SARS Coronavirus 2 NEGATIVE NEGATIVE Final    Comment: (NOTE) SARS-CoV-2 target nucleic acids are NOT DETECTED. The SARS-CoV-2 RNA is generally detectable in upper and lower respiratory specimens during the acute phase of infection. Negative results do not preclude SARS-CoV-2 infection, do not rule out co-infections with other pathogens, and should not be used as the sole basis for treatment or other patient management decisions. Negative results must be combined with clinical observations, patient history, and epidemiological information. The expected result is Negative. Fact Sheet for Patients: SugarRoll.be Fact Sheet for Healthcare Providers: https://www.woods-mathews.com/ This test is not yet approved or cleared by the [place] FDA and  has been authorized for detection and/or diagnosis of SARS-CoV-2 by FDA under an Emergency  Use Authorization (EUA). This EUA will remain  in effect (meaning this test can be used) for the duration of the COVID-19 declaration under Section 56 4(b)(1) of the Act, 21 U.S.C. section 360bbb-3(b)(1), unless the authorization is terminated or revoked sooner. Performed at [facility], [place] [address] [place], [place] [address]      Labs: Basic Metabolic Panel: Recent Labs  Lab 07/30/19 1417 07/31/19 0256 08/01/19 0244  NA 137 135 137  K 4.0 3.8 3.9  CL 99 104 107  CO2 _0 GLUCOSE 146* 135* 81  BUN _1 CREATININE 1.07 0.85 0.87  CALCIUM 9.8 8.3* 8.4*  PHOS 3.2  --  3.3   Liver Function Tests: Recent Labs  Lab 08/01/19 0244  ALBUMIN 3.3*   No results for input(s): LIPASE, AMYLASE in the last 168 hours. No results for input(s): AMMONIA in the last 168 hours. CBC: Recent Labs  Lab 07/30/19 1417  WBC 4.9  HGB 14.9  HCT 45.3  MCV 91.3  PLT 275   Cardiac Enzymes: Recent Labs  Lab 07/30/19 1417 07/31/19 0256 08/01/19 0244  CKTOTAL 24,599* 13,512* 7,073*  BNP: BNP (last 3 results) No results for input(s): BNP in the last 8760 hours.  ProBNP (last 3 results) No results for input(s): PROBNP in the last 8760 hours.  CBG: Recent Labs  Lab 07/31/19 0725 07/31/19 1130 07/31/19 1628 07/31/19 2058 08/01/19 0715  GLUCAP 111* 85 154* 85 82       Signed:  Nita Sells MD   [facility] 08/01/2019, 8:47 AM

## 2019-12-06 ENCOUNTER — Other Ambulatory Visit: Payer: Self-pay

## 2019-12-06 ENCOUNTER — Emergency Department (HOSPITAL_COMMUNITY)
Admission: EM | Admit: 2019-12-06 | Discharge: 2019-12-06 | Disposition: A | Discharge status: Home / Self Care | Payer: Self-pay | Attending: Emergency Medicine | Admitting: Emergency Medicine

## 2019-12-06 ENCOUNTER — Encounter (HOSPITAL_COMMUNITY): Payer: Self-pay

## 2019-12-06 DIAGNOSIS — R1110 Vomiting, unspecified: Secondary | ICD-10-CM | POA: Insufficient documentation

## 2019-12-06 DIAGNOSIS — R519 Headache, unspecified: Secondary | ICD-10-CM | POA: Insufficient documentation

## 2019-12-06 DIAGNOSIS — Z87891 Personal history of nicotine dependence: Secondary | ICD-10-CM | POA: Insufficient documentation

## 2019-12-06 DIAGNOSIS — R42 Dizziness and giddiness: Secondary | ICD-10-CM

## 2019-12-06 LAB — CK: Total CK: 248 U/L (ref 49–397)

## 2019-12-06 LAB — CBC WITH DIFFERENTIAL/PLATELET
Abs Immature Granulocytes: 0.01 10*3/uL (ref 0.00–0.07)
Basophils Absolute: 0 10*3/uL (ref 0.0–0.1)
Basophils Relative: 1 %
Eosinophils Absolute: 0 10*3/uL (ref 0.0–0.5)
Eosinophils Relative: 0 %
HCT: 54.1 % — ABNORMAL HIGH (ref 39.0–52.0)
Hemoglobin: 17.9 g/dL — ABNORMAL HIGH (ref 13.0–17.0)
Immature Granulocytes: 0 %
Lymphocytes Relative: 37 %
Lymphs Abs: 1.5 10*3/uL (ref 0.7–4.0)
MCH: 29.7 pg (ref 26.0–34.0)
MCHC: 33.1 g/dL (ref 30.0–36.0)
MCV: 89.7 fL (ref 80.0–100.0)
Monocytes Absolute: 0.4 10*3/uL (ref 0.1–1.0)
Monocytes Relative: 9 %
Neutro Abs: 2.1 10*3/uL (ref 1.7–7.7)
Neutrophils Relative %: 53 %
Platelets: 282 10*3/uL (ref 150–400)
RBC: 6.03 MIL/uL — ABNORMAL HIGH (ref 4.22–5.81)
RDW: 11.1 % — ABNORMAL LOW (ref 11.5–15.5)
WBC: 3.9 10*3/uL — ABNORMAL LOW (ref 4.0–10.5)
nRBC: 0 % (ref 0.0–0.2)

## 2019-12-06 LAB — COMPREHENSIVE METABOLIC PANEL
ALT: 14 U/L (ref 0–44)
AST: 32 U/L (ref 15–41)
Albumin: 4.7 g/dL (ref 3.5–5.0)
Alkaline Phosphatase: 42 U/L (ref 38–126)
Anion gap: 12 (ref 5–15)
BUN: 14 mg/dL (ref 6–20)
CO2: 23 mmol/L (ref 22–32)
Calcium: 9.6 mg/dL (ref 8.9–10.3)
Chloride: 100 mmol/L (ref 98–111)
Creatinine, Ser: 1.19 mg/dL (ref 0.61–1.24)
GFR calc Af Amer: >60 mL/min (ref >60)
GFR calc non Af Amer: >60 mL/min (ref >60)
Glucose, Bld: 98 mg/dL (ref 70–99)
Potassium: 5.3 mmol/L — ABNORMAL HIGH (ref 3.5–5.1)
Sodium: 135 mmol/L (ref 135–145)
Total Bilirubin: 1.6 mg/dL — ABNORMAL HIGH (ref 0.3–1.2)
Total Protein: 7.7 g/dL (ref 6.5–8.1)

## 2019-12-06 LAB — URINALYSIS, ROUTINE W REFLEX MICROSCOPIC
Bilirubin Urine: NEGATIVE
Glucose, UA: NEGATIVE mg/dL
Hgb urine dipstick: NEGATIVE
Ketones, ur: 20 mg/dL — AB
Leukocytes,Ua: NEGATIVE
Nitrite: NEGATIVE
Protein, ur: NEGATIVE mg/dL
Specific Gravity, Urine: 1.024 (ref 1.005–1.030)
pH: 6 (ref 5.0–8.0)

## 2019-12-06 MED ORDER — SODIUM CHLORIDE 0.9 % IV BOLUS
1000.0000 mL | Freq: Once | INTRAVENOUS | Status: AC
  Administered 2019-12-06: 17:00:00 1000 mL via INTRAVENOUS

## 2019-12-06 NOTE — Discharge Instructions (Addendum)
You should have your potassium rechecked within the next few days.  Return here as needed for any worsening symptoms.

## 2019-12-06 NOTE — ED Triage Notes (Signed)
Patient c/o dizziness, headache, nausea, and fatigue x 3 days

## 2019-12-06 NOTE — ED Provider Notes (Signed)
[facility] DEPT Provider Note   CSN: [id number] Arrival date & time: 12/06/19  1209     History Chief Complaint  Patient presents with  . Dizziness  . Headache    Anthony Henderson is a 39 y.o. male.  Patient is a 39 year old-year-old male who presents with fatigue and intermittent headaches for the last 4 days.  He said he is just felt tired.  He has had some intermittent bifrontal headaches which usually is relieved with Tylenol.  He has a mild headache now.  No associated neck pain.  No fevers.  He had one episode of vomiting but not today.  He feels like it might have been related to anxiety.  He denies any congestion.  No coughing.  No shortness of breath.  He feels dizzy at times.  He cannot really distinguish between lightheadedness versus vertiginous type symptoms.  Its not related to head movement.  Its not worse with ambulation.  He states he has had 2 - rapid Covid test this week.  He denies any myalgias.  No urinary symptoms.  No diarrhea.        Past Medical History:  Diagnosis Date  . Abdominal pain   . Anxiety    "related to SVT"  . Atrial fibrillation ([place])    following RFCA of SVT 03/2014 - on Flecainide  . Depression   . GERD (gastroesophageal reflux disease)   . Hiatal hernia   . High risk sexual behavior   . Other malaise and fatigue   . Panic attack   . Post-operative nausea and vomiting   . SVT (supraventricular tachycardia) [facility])    s/p ablation 03-10-14 by Dr Lovena Le    Patient Active Problem List   Diagnosis Date Noted  . 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 Prediabetes 07/30/2019  . Encounter for screening for HIV 07/30/2019  . Change in bowel habit 09/20/2018  . Abdominal pain, epigastric 03/24/2016  . Pyrosis 03/24/2016  . AP (abdominal pain) 10/21/2014  . Chronic nausea 10/21/2014  . Esophageal reflux 08/27/2014  . Paroxysmal supraventricular tachycardia ([place]) 03/10/2014  . PSVT (paroxysmal supraventricular  tachycardia) ([place]) 02/07/2014  . SVT (supraventricular tachycardia) ([place]) 02/03/2014  . Anxiety and depression 05/10/2013    Past Surgical History:  Procedure Laterality Date  . ABLATION  03/10/2014   RFCA of left posterior septal accessory pathyway by Dr Lovena Le with resultant atrial fibrillation  . SUPRAVENTRICULAR TACHYCARDIA ABLATION N/A 03/10/2014   Procedure: SUPRAVENTRICULAR TACHYCARDIA ABLATION;  Surgeon: Evans Lance, MD;  Location: [facility] CATH LAB;  Service: Cardiovascular;  Laterality: N/A;       Family History  Problem Relation Age of Onset  . Diabetes Maternal Grandmother   . Colon cancer Neg Hx   . Esophageal cancer Neg Hx   . Rectal cancer Neg Hx   . Stomach cancer Neg Hx     Social History   Tobacco Use  . Smoking status: Former Smoker    Packs/day: 0.25    Years: 10.00    Pack years: 2.50    Types: Cigarettes    Quit date: 02/03/2014    Years since quitting: 5.8  . Smokeless tobacco: Never Used  Substance Use Topics  . Alcohol use: No    Alcohol/week: 0.0 standard drinks    Comment: 03/10/2014 "might have a few drinks 1-2 times/yr, if that"  . Drug use: No    Home Medications Prior to Admission medications   Medication Sig Start Date End Date Taking? Authorizing Provider  flecainide (TAMBOCOR) 100 MG tablet Take 0.5 tablets (50 mg total) by mouth daily. Patient taking differently: Take 50 mg by mouth 2 (two) times a week.  11/06/18  Yes Evans Lance, MD  blood glucose meter kit and supplies KIT Dispense based on patient and insurance preference. Use up to four times daily as directed. (FOR ICD-9 250.00, 250.01). Patient not taking: Reported on 12/06/2019 08/01/19   Nita Sells, MD  ondansetron (ZOFRAN ODT) 4 MG disintegrating tablet Dissolve 1 tablet on the tongue daily as needed for nausea. Patient not taking: Reported on 12/06/2019 05/28/19   Pyrtle, Lajuan Lines, MD    Allergies    Penicillins  Review of Systems   Review of Systems  Constitutional:  Positive for fatigue. Negative for chills, diaphoresis and fever.  HENT: Negative for congestion, rhinorrhea and sneezing.   Eyes: Negative.   Respiratory: Negative for cough, chest tightness and shortness of breath.   Cardiovascular: Negative for chest pain and leg swelling.  Gastrointestinal: Positive for nausea and vomiting (one time). Negative for abdominal pain, blood in stool and diarrhea.  Genitourinary: Negative for difficulty urinating, flank pain, frequency and hematuria.  Musculoskeletal: Negative for arthralgias and back pain.  Skin: Negative for rash.  Neurological: Positive for dizziness and headaches. Negative for speech difficulty, weakness and numbness.    Physical Exam Updated Vital Signs BP 128/79 (BP Location: Left Arm)   Pulse 73   Temp 97.9 F (36.6 C) (Oral)   Resp 17   Ht 6' (1.829 m)   Wt 62.6 kg   SpO2 100%   BMI 18.72 kg/m   Physical Exam Constitutional:      Appearance: He is well-developed.  HENT:     Head: Normocephalic and atraumatic.  Eyes:     Pupils: Pupils are equal, round, and reactive to light.  Cardiovascular:     Rate and Rhythm: Normal rate and regular rhythm.     Heart sounds: Normal heart sounds.  Pulmonary:     Effort: Pulmonary effort is normal. No respiratory distress.     Breath sounds: Normal breath sounds. No wheezing or rales.  Chest:     Chest wall: No tenderness.  Abdominal:     General: Bowel sounds are normal.     Palpations: Abdomen is soft.     Tenderness: There is no abdominal tenderness. There is no guarding or rebound.  Musculoskeletal:        General: Normal range of motion.     Cervical back: Normal range of motion and neck supple.  Lymphadenopathy:     Cervical: No cervical adenopathy.  Skin:    General: Skin is warm and dry.     Findings: No rash.  Neurological:     Mental Status: He is alert and oriented to person, place, and time.     ED Results / Procedures / Treatments   Labs (all labs ordered  are listed, but only abnormal results are displayed) Labs Reviewed  COMPREHENSIVE METABOLIC PANEL - Abnormal; Notable for the following components:      Result Value   Potassium 5.3 (*)    Total Bilirubin 1.6 (*)    All other components within normal limits  URINALYSIS, ROUTINE W REFLEX MICROSCOPIC - Abnormal; Notable for the following components:   Ketones, ur 20 (*)    All other components within normal limits  CBC WITH DIFFERENTIAL/PLATELET - Abnormal; Notable for the following components:   WBC 3.9 (*)    RBC 6.03 (*)  Hemoglobin 17.9 (*)    HCT 54.1 (*)    RDW 11.1 (*)    All other components within normal limits  CK  CBC WITH DIFFERENTIAL/PLATELET  I-STAT CHEM 8, ED    EKG None  Radiology No results found.  Procedures Procedures (including critical care time)  Medications Ordered in ED Medications  sodium chloride 0.9 % bolus 1,000 mL (1,000 mLs Intravenous New Bag/Given 12/06/19 1640)    ED Course  I have reviewed the triage vital signs and the nursing notes.  Pertinent labs & imaging results that were available during my care of the patient were reviewed by me and considered in my medical decision making (see chart for details).    MDM Rules/Calculators/A&P                      Patient is a 39 year old male who presents with some intermittent dizziness and headache with some fatigue over the last 3 days.  His labs are nonconcerning.  His potassium is minimally elevated.  His creatinine is normal.  His other labs are nonconcerning.  There is no evidence of urinary tract infection.  He does not appear to be systemically ill.  His vital signs are stable.  He has no meningismal signs.  No current significant headache.  No neurologic deficits.  He has had 2 - Covid test this week.  He likely has a viral type illness.  He was discharged home in good condition.  Was given a recheck his potassium but he does not want to stay any longer and I advised him he can have this  followed up as an outpatient.  Its only minimally elevated.  Return precautions were given. Final Clinical Impression(s) / ED Diagnoses Final diagnoses:  Dizziness    Rx / DC Orders ED Discharge Orders    None       Malvin Johns, MD 12/06/19 1950

## 2020-03-09 DIAGNOSIS — E119 Type 2 diabetes mellitus without complications: Secondary | ICD-10-CM | POA: Insufficient documentation

## 2020-03-25 ENCOUNTER — Other Ambulatory Visit: Payer: Self-pay

## 2020-03-25 MED ORDER — FLECAINIDE ACETATE 100 MG PO TABS: 50.0000 mg | ORAL_TABLET | Freq: Every day | ORAL | 0 refills | Status: DC

## 2020-03-25 NOTE — Telephone Encounter (Signed)
Pt's medication was sent to pt's pharmacy as requested. Confirmation received.  °

## 2020-03-29 ENCOUNTER — Other Ambulatory Visit: Payer: Self-pay | Admitting: Internal Medicine

## 2020-05-06 ENCOUNTER — Emergency Department (HOSPITAL_COMMUNITY): Payer: BC Managed Care – PPO

## 2020-05-06 ENCOUNTER — Other Ambulatory Visit: Payer: Self-pay

## 2020-05-06 ENCOUNTER — Encounter (HOSPITAL_COMMUNITY): Payer: Self-pay | Admitting: Emergency Medicine

## 2020-05-06 ENCOUNTER — Emergency Department (HOSPITAL_COMMUNITY)
Admission: EM | Admit: 2020-05-06 | Discharge: 2020-05-07 | Disposition: A | Discharge status: Home / Self Care | Payer: BC Managed Care – PPO | Attending: Emergency Medicine | Admitting: Emergency Medicine

## 2020-05-06 DIAGNOSIS — Y939 Activity, unspecified: Secondary | ICD-10-CM | POA: Insufficient documentation

## 2020-05-06 DIAGNOSIS — S0990XA Unspecified injury of head, initial encounter: Secondary | ICD-10-CM | POA: Diagnosis present

## 2020-05-06 DIAGNOSIS — G9389 Other specified disorders of brain: Secondary | ICD-10-CM | POA: Diagnosis not present

## 2020-05-06 DIAGNOSIS — Y999 Unspecified external cause status: Secondary | ICD-10-CM | POA: Insufficient documentation

## 2020-05-06 DIAGNOSIS — W228XXA Striking against or struck by other objects, initial encounter: Secondary | ICD-10-CM | POA: Insufficient documentation

## 2020-05-06 DIAGNOSIS — Z87891 Personal history of nicotine dependence: Secondary | ICD-10-CM | POA: Insufficient documentation

## 2020-05-06 DIAGNOSIS — Y929 Unspecified place or not applicable: Secondary | ICD-10-CM | POA: Diagnosis not present

## 2020-05-06 DIAGNOSIS — M542 Cervicalgia: Secondary | ICD-10-CM | POA: Insufficient documentation

## 2020-05-06 LAB — CBC WITH DIFFERENTIAL/PLATELET
Abs Immature Granulocytes: 0.01 10*3/uL (ref 0.00–0.07)
Basophils Absolute: 0 10*3/uL (ref 0.0–0.1)
Basophils Relative: 1 %
Eosinophils Absolute: 0 10*3/uL (ref 0.0–0.5)
Eosinophils Relative: 1 %
HCT: 46.9 % (ref 39.0–52.0)
Hemoglobin: 15.7 g/dL (ref 13.0–17.0)
Immature Granulocytes: 0 %
Lymphocytes Relative: 35 %
Lymphs Abs: 1.4 10*3/uL (ref 0.7–4.0)
MCH: 30.4 pg (ref 26.0–34.0)
MCHC: 33.5 g/dL (ref 30.0–36.0)
MCV: 90.9 fL (ref 80.0–100.0)
Monocytes Absolute: 0.4 10*3/uL (ref 0.1–1.0)
Monocytes Relative: 10 %
Neutro Abs: 2.1 10*3/uL (ref 1.7–7.7)
Neutrophils Relative %: 53 %
Platelets: 268 10*3/uL (ref 150–400)
RBC: 5.16 MIL/uL (ref 4.22–5.81)
RDW: 11.1 % — ABNORMAL LOW (ref 11.5–15.5)
WBC: 3.9 10*3/uL — ABNORMAL LOW (ref 4.0–10.5)
nRBC: 0 % (ref 0.0–0.2)

## 2020-05-06 LAB — BASIC METABOLIC PANEL
Anion gap: 13 (ref 5–15)
BUN: 14 mg/dL (ref 6–20)
CO2: 26 mmol/L (ref 22–32)
Calcium: 9.8 mg/dL (ref 8.9–10.3)
Chloride: 100 mmol/L (ref 98–111)
Creatinine, Ser: 1.03 mg/dL (ref 0.61–1.24)
GFR calc Af Amer: >60 mL/min (ref >60)
GFR calc non Af Amer: >60 mL/min (ref >60)
Glucose, Bld: 110 mg/dL — ABNORMAL HIGH (ref 70–99)
Potassium: 3.5 mmol/L (ref 3.5–5.1)
Sodium: 139 mmol/L (ref 135–145)

## 2020-05-06 MED ORDER — LORAZEPAM 2 MG/ML IJ SOLN
1.0000 mg | Freq: Once | INTRAMUSCULAR | Status: AC | PRN
  Administered 2020-05-07: 1 mg via INTRAVENOUS
  Filled 2020-05-06: qty 1

## 2020-05-06 NOTE — ED Provider Notes (Signed)
Emergency Department Provider Note   I have reviewed the triage vital signs and the nursing notes.   HISTORY  Chief Complaint Head Injury   HPI Anthony Henderson is a 39 y.o. male with past history reviewed below presents to the emergency department with headache and neck pain after striking his head several days ago.  Patient is having a pain up the left neck up into the back of the posterior scalp.  He has some worsening pain with movement and touching the area.  He is not experiencing vision changes, numbness, weakness.  No speech difficulty. No arm weakness/numbness. No fever. No CP.    Past Medical History:  Diagnosis Date  . Abdominal pain   . Anxiety    "related to SVT"  . Atrial fibrillation ([place])    following RFCA of SVT 03/2014 - on Flecainide  . Depression   . GERD (gastroesophageal reflux disease)   . Hiatal hernia   . High risk sexual behavior   . Other malaise and fatigue   . Panic attack   . Post-operative nausea and vomiting   . SVT (supraventricular tachycardia) [facility])    s/p ablation 03-10-14 by Dr Lovena Le    Patient Active Problem List   Diagnosis Date Noted  . 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 Prediabetes 07/30/2019  . Encounter for screening for HIV 07/30/2019  . Change in bowel habit 09/20/2018  . Abdominal pain, epigastric 03/24/2016  . Pyrosis 03/24/2016  . AP (abdominal pain) 10/21/2014  . Chronic nausea 10/21/2014  . Esophageal reflux 08/27/2014  . Paroxysmal supraventricular tachycardia ([place]) 03/10/2014  . PSVT (paroxysmal supraventricular tachycardia) ([place]) 02/07/2014  . SVT (supraventricular tachycardia) ([place]) 02/03/2014  . Anxiety and depression 05/10/2013    Past Surgical History:  Procedure Laterality Date  . ABLATION  03/10/2014   RFCA of left posterior septal accessory pathyway by Dr Lovena Le with resultant atrial fibrillation  . SUPRAVENTRICULAR TACHYCARDIA ABLATION N/A 03/10/2014   Procedure: SUPRAVENTRICULAR TACHYCARDIA ABLATION;   Surgeon: Evans Lance, MD;  Location: [facility] CATH LAB;  Service: Cardiovascular;  Laterality: N/A;    Allergies Penicillins  Family History  Problem Relation Age of Onset  . Diabetes Maternal Grandmother   . Colon cancer Neg Hx   . Esophageal cancer Neg Hx   . Rectal cancer Neg Hx   . Stomach cancer Neg Hx     Social History Social History   Tobacco Use  . Smoking status: Former Smoker    Packs/day: 0.25    Years: 10.00    Pack years: 2.50    Types: Cigarettes    Quit date: 02/03/2014    Years since quitting: 6.2  . Smokeless tobacco: Never Used  Substance Use Topics  . Alcohol use: No    Alcohol/week: 0.0 standard drinks    Comment: 03/10/2014 "might have a few drinks 1-2 times/yr, if that"  . Drug use: No    Review of Systems  Constitutional: No fever/chills. Eyes: No visual changes. ENT: No sore throat. Cardiovascular: Denies chest pain. Respiratory: Denies shortness of breath. Gastrointestinal: No abdominal pain.  No nausea, no vomiting.  No diarrhea.  No constipation. Genitourinary: Negative for dysuria. Musculoskeletal: Negative for back pain. Skin: Negative for rash. Neurological: Negative for headaches, focal weakness or numbness.  10-point ROS otherwise negative.  ____________________________________________   PHYSICAL EXAM:  VITAL SIGNS: ED Triage Vitals  Enc Vitals Group     BP 05/06/20 1901 126/78     Pulse Rate 05/06/20 1901 74  Resp 05/06/20 1901 16     Temp 05/06/20 1901 98.8 F (37.1 C)     Temp Source 05/06/20 1901 Oral     SpO2 05/06/20 1901 100 %   Constitutional: Alert and oriented. Well appearing and in no acute distress. Eyes: Conjunctivae are normal.  Head: Atraumatic. Nose: No congestion/rhinnorhea. Mouth/Throat: Mucous membranes are moist.  Neck: No stridor.  Cardiovascular: Normal rate, regular rhythm. Good peripheral circulation. Grossly normal heart sounds.   Respiratory: Normal respiratory effort.  No retractions. Lungs  CTAB. Gastrointestinal: Soft and nontender. No distention.  Musculoskeletal: No gross deformities of extremities. Left paraspinal tenderness in the cervical spine region. No midline tenderness.  Neurologic:  Normal speech and language.  Skin:  Skin is warm, dry and intact. No rash noted.  ____________________________________________   LABS (all labs ordered are listed, but only abnormal results are displayed)  Labs Reviewed  BASIC METABOLIC PANEL - Abnormal; Notable for the following components:      Result Value   Glucose, Bld 110 (*)    All other components within normal limits  CBC WITH DIFFERENTIAL/PLATELET - Abnormal; Notable for the following components:   WBC 3.9 (*)    RDW 11.1 (*)    All other components within normal limits   ____________________________________________  RADIOLOGY  CT head reveiwed ____________________________________________   PROCEDURES  Procedure(s) performed:   Procedures  None ____________________________________________   INITIAL IMPRESSION / ASSESSMENT AND PLAN / ED COURSE  Pertinent labs & imaging results that were available during my care of the patient were reviewed by me and considered in my medical decision making (see chart for details).   Patient presents to the emergency department after head injury several days ago.  He has had headache and left lateral neck pain with a whooshing type feeling in his head mainly with movement. Likely mild concussion but will f/u with CT head and c-spine.   CT head with 2cm cystic mass with mild surrounding edema. Radiology recommends MRI with contrast. Discussed with wife and patient. Will get MRI and reassess.   Care transferred to Dr. Leonides Schanz.  ____________________________________________  FINAL CLINICAL IMPRESSION(S) / ED DIAGNOSES  Final diagnoses:  Injury of head, initial encounter  Brain mass     MEDICATIONS GIVEN DURING THIS VISIT:  Medications  LORazepam (ATIVAN) injection 1  mg (1 mg Intravenous Given 05/07/20 0000)  gadobutrol (GADAVIST) 1 MMOL/ML injection 8.5 mL (8.5 mLs Intravenous Contrast Given 05/07/20 0022)  methocarbamol (ROBAXIN) tablet 500 mg (500 mg Oral Given 05/07/20 0140)     NEW OUTPATIENT MEDICATIONS STARTED DURING THIS VISIT:  Discharge Medication List as of 05/07/2020  3:38 AM    START taking these medications   Details  methocarbamol (ROBAXIN) 500 MG tablet Take 1 tablet (500 mg total) by mouth every 8 (eight) hours as needed for muscle spasms., Starting Thu 05/07/2020, Print        Note:  This document was prepared using Dragon voice recognition software and may include unintentional dictation errors.  Nanda Quinton, MD, [facility] Emergency Medicine    Nanie Dunkleberger, Wonda Olds, MD 05/09/20 [phone]

## 2020-05-06 NOTE — ED Triage Notes (Signed)
Pt states he hit the top of his head on his shed door Saturday- Pt denies any LOC just hurt and the next day having stiff neck and pain on left side.

## 2020-05-07 MED ORDER — METHOCARBAMOL 500 MG PO TABS: 500.0000 mg | ORAL_TABLET | Freq: Three times a day (TID) | ORAL | 0 refills | Status: DC | PRN

## 2020-05-07 MED ORDER — ACETAMINOPHEN 500 MG PO TABS: 1000.0000 mg | ORAL_TABLET | Freq: Once | ORAL | Status: DC

## 2020-05-07 MED ORDER — GADOBUTROL 1 MMOL/ML IV SOLN
8.5000 mL | Freq: Once | INTRAVENOUS | Status: AC | PRN
  Administered 2020-05-07: 8.5 mL via INTRAVENOUS

## 2020-05-07 MED ORDER — METHOCARBAMOL 500 MG PO TABS
500.0000 mg | ORAL_TABLET | Freq: Once | ORAL | Status: AC
  Administered 2020-05-07: 500 mg via ORAL
  Filled 2020-05-07: qty 1

## 2020-05-07 NOTE — Discharge Instructions (Signed)
Your imaging showed no acute injury to your skull, brain or cervical spine after your head injury on Saturday.  We did find a cystic lesion in the right side of the brain.  We have discussed this finding with our neurosurgeon Dr. Christella Noa.  He recommends close outpatient follow-up but given this is an incidental finding, you do not require admission or emergent surgery at this time.  If you develop severe headache that is uncontrolled with over-the-counter Tylenol or ibuprofen, numbness or weakness on one side of your body, changes in vision or speech, vomiting that will not stop, confusion, please return to the emergency department immediately.

## 2020-05-07 NOTE — ED Provider Notes (Signed)
11:30 PM  Assumed care.  Patient is a 39 year old male who presented to the emergency department after head injury that occurred on Saturday.  Complaining of head and neck pain.  CT of the head and cervical spine showed no acute injury but CT of the head did show 2.3 x 2.4 cm low-attenuation lesion in the right temporal lobe suspicious for cystic mass with mild surrounding edema and minimal enlargement of the right temporal horn.  MRI with and without contrast ordered for further evaluation.  Per previous provider, patient neurologically intact.  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  No other acute abnormality noted.  Will consult neurosurgery.  3:20 AM  Discussed with Dr. Christella Noa on-call for neurosurgery.  Discussed MRI results.  He states given this is an incidental finding and not emergent, patient can be seen as an outpatient in his office very quickly.  Recommends calling in the morning for an appointment.  He does not recommend steroids at this time for mild surrounding vasogenic edema.  Updated patient and wife at bedside.  Will discharge home with outpatient neurosurgery follow-up information.  At this time, I do not feel there is any life-threatening condition present. I have reviewed, interpreted and discussed all results (EKG, imaging, lab, urine as appropriate) and exam findings with patient/family. I have reviewed nursing notes and appropriate previous records.  I feel the patient is safe to be discharged home without further emergent workup and can continue workup as an outpatient as needed. Discussed usual and customary return precautions. Patient/family verbalize understanding and are comfortable with this plan.  Outpatient follow-up has been provided as needed. All questions have been answered.    Ilyana Manuele, Delice Bison, DO 05/07/20 [phone]

## 2020-05-07 NOTE — ED Notes (Signed)
Patient transported to MRI 

## 2020-07-23 ENCOUNTER — Other Ambulatory Visit (HOSPITAL_COMMUNITY): Payer: Self-pay | Admitting: Neurosurgery

## 2020-07-23 ENCOUNTER — Other Ambulatory Visit: Payer: Self-pay | Admitting: Neurosurgery

## 2020-07-23 DIAGNOSIS — D432 Neoplasm of uncertain behavior of brain, unspecified: Secondary | ICD-10-CM

## 2020-08-06 ENCOUNTER — Other Ambulatory Visit: Payer: Self-pay

## 2020-08-06 ENCOUNTER — Ambulatory Visit (HOSPITAL_COMMUNITY)
Admission: RE | Admit: 2020-08-06 | Discharge: 2020-08-06 | Disposition: A | Discharge status: Home / Self Care | Payer: BC Managed Care – PPO | Source: Ambulatory Visit | Attending: Neurosurgery | Admitting: Neurosurgery

## 2020-08-06 DIAGNOSIS — D432 Neoplasm of uncertain behavior of brain, unspecified: Secondary | ICD-10-CM | POA: Insufficient documentation

## 2020-08-06 MED ORDER — GADOBUTROL 1 MMOL/ML IV SOLN
6.2000 mL | Freq: Once | INTRAVENOUS | Status: AC | PRN
  Administered 2020-08-06: 6.2 mL via INTRAVENOUS

## 2020-08-24 ENCOUNTER — Other Ambulatory Visit: Payer: Self-pay | Admitting: *Deleted

## 2020-08-24 MED ORDER — FLECAINIDE ACETATE 100 MG PO TABS: 50.0000 mg | ORAL_TABLET | Freq: Every day | ORAL | 0 refills | Status: DC

## 2020-09-16 DIAGNOSIS — R197 Diarrhea, unspecified: Secondary | ICD-10-CM | POA: Insufficient documentation

## 2020-09-16 DIAGNOSIS — R5383 Other fatigue: Secondary | ICD-10-CM | POA: Insufficient documentation

## 2020-09-16 DIAGNOSIS — R7303 Prediabetes: Secondary | ICD-10-CM | POA: Insufficient documentation

## 2020-09-16 DIAGNOSIS — D489 Neoplasm of uncertain behavior, unspecified: Secondary | ICD-10-CM | POA: Insufficient documentation

## 2020-09-16 DIAGNOSIS — F419 Anxiety disorder, unspecified: Secondary | ICD-10-CM | POA: Insufficient documentation

## 2020-10-09 ENCOUNTER — Ambulatory Visit: Payer: BC Managed Care – PPO | Admitting: Internal Medicine

## 2020-10-09 ENCOUNTER — Other Ambulatory Visit: Payer: Self-pay

## 2020-10-09 ENCOUNTER — Encounter: Payer: Self-pay | Admitting: Internal Medicine

## 2020-10-09 DIAGNOSIS — I10 Essential (primary) hypertension: Secondary | ICD-10-CM | POA: Diagnosis not present

## 2020-10-09 DIAGNOSIS — R002 Palpitations: Secondary | ICD-10-CM | POA: Insufficient documentation

## 2020-10-09 NOTE — Patient Instructions (Signed)

## 2020-10-09 NOTE — Progress Notes (Signed)
HPI [name] returns today for ongoing evaluation and management of palpitations.  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  He underwent catheter ablation approximately 6 years ago.  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  I saw the patient last 2 years ago.  He has had some palpitations but no demonstrable recurrent SVT.  The patient has been treated with as needed low-dose flecainide.  He states that he will take 1 pill approximately 3 or 4 times a week.  In the last year his palpitations have been well controlled with no sustained episodes.  He has been noted to have diet-controlled diabetes.  He is having difficulty with fluctuations in his blood sugar and intense hunger cravings.  He admits to consuming a fairly high carbohydrate diet. Allergies  Allergen Reactions  . Penicillins Hives    Did it involve swelling of the face/tongue/throat, SOB, or low BP? No Did it involve sudden or severe rash/hives, skin peeling, or any reaction on the inside of your mouth or nose? Yes Did you need to seek medical attention at a hospital or doctor's office? No When did it last happen?childhood If all above answers are "NO", may proceed with cephalosporin use.     Current Outpatient Medications  Medication Sig Dispense Refill  . blood glucose meter kit and supplies KIT Dispense based on patient and insurance preference. Use up to four times daily as directed. (FOR ICD-9 250.00, 250.01). 1 each 0  . busPIRone (BUSPAR) 5 MG tablet Take 5 mg by mouth as needed for anxiety.    . flecainide (TAMBOCOR) 100 MG tablet Take 0.5 tablets (50 mg total) by mouth daily. 45 tablet 0   No current facility-administered medications for this visit.     Past Medical History:  Diagnosis Date  . Abdominal pain   . Anxiety    "related to SVT"  . Atrial fibrillation ([place])     following RFCA of SVT 03/2014 - on Flecainide  . Depression   . GERD (gastroesophageal reflux disease)   . Hiatal hernia   . High risk sexual behavior   . Other malaise and fatigue   . Panic attack   . Post-operative nausea and vomiting   . SVT (supraventricular tachycardia) ([place])    s/p ablation 03-10-14 by Dr Lovena Le    ROS:   All systems reviewed and negative except as noted in the HPI.   Past Surgical History:  Procedure Laterality Date  . ABLATION  03/10/2014   RFCA of left posterior septal accessory pathyway by Dr Lovena Le with resultant atrial fibrillation  . SUPRAVENTRICULAR TACHYCARDIA ABLATION N/A 03/10/2014   Procedure: SUPRAVENTRICULAR TACHYCARDIA ABLATION;  Surgeon: Evans Lance, MD;  Location: [facility] CATH LAB;  Service: Cardiovascular;  Laterality: N/A;     Family History  Problem Relation Age of Onset  . Diabetes Maternal Grandmother   . Colon cancer Neg Hx   . Esophageal cancer Neg Hx   . Rectal cancer Neg Hx   . Stomach cancer Neg Hx      Social History   Socioeconomic History  . Marital status: Married    Spouse name: Not on file  . Number of children: 3  . Years of education: Not on file  . Highest education level: Not on file  Occupational History  . Occupation: Time Environmental consultant  Tobacco Use  .  Smoking status: Former Smoker    Packs/day: 0.25    Years: 10.00    Pack years: 2.50    Types: Cigarettes    Quit date: 02/03/2014    Years since quitting: 6.6  . Smokeless tobacco: Never Used  Vaping Use  . Vaping Use: Never used  Substance and Sexual Activity  . Alcohol use: No    Alcohol/week: 0.0 standard drinks    Comment: 03/10/2014 "might have a few drinks 1-2 times/yr, if that"  . Drug use: No  . Sexual activity: Yes  Other Topics Concern  . Not on file  Social History Narrative  . Not on file   Social Determinants of Health   Financial Resource Strain:   . Difficulty of Paying Living Expenses: Not on file  Food Insecurity:   .  Worried About Charity fundraiser in the Last Year: Not on file  . Ran Out of Food in the Last Year: Not on file  Transportation Needs:   . Lack of Transportation (Medical): Not on file  . Lack of Transportation (Non-Medical): Not on file  Physical Activity:   . Days of Exercise per Week: Not on file  . Minutes of Exercise per Session: Not on file  Stress:   . Feeling of Stress : Not on file  Social Connections:   . Frequency of Communication with Friends and Family: Not on file  . Frequency of Social Gatherings with Friends and Family: Not on file  . Attends Religious Services: Not on file  . Active Member of Clubs or Organizations: Not on file  . Attends Archivist Meetings: Not on file  . Marital Status: Not on file  Intimate Partner Violence:   . Fear of Current or Ex-Partner: Not on file  . Emotionally Abused: Not on file  . Physically Abused: Not on file  . Sexually Abused: Not on file     BP 116/78   Pulse 74   Ht 6' (1.829 m)   Wt 138 lb (62.6 kg)   SpO2 93%   BMI 18.72 kg/m   Physical Exam:  Well appearing 39 year old man, NAD HEENT: Unremarkable Neck:  No JVD, no thyromegally Lymphatics:  No adenopathy Back:  No CVA tenderness Lungs:  Clear, with no wheezes, rales, or rhonchi HEART:  Regular rate rhythm, no murmurs, no rubs, no clicks Abd:  soft, positive bowel sounds, no organomegally, no rebound, no guarding Ext:  2 plus pulses, no edema, no cyanosis, no clubbing Skin:  No rashes no nodules Neuro:  CN II through XII intact, motor grossly intact  EKG -normal sinus rhythm with no ventricular preexcitation.  Assess/Plan: 1.  Palpitations -his symptoms are well controlled.  He will continue to take as needed flecainide. 2.  SVT -he has had no recurrent sustained SVT since his ablation.  He will undergo watchful waiting. 3.  Diabetes -I have strongly encouraged the patient to forego high carbohydrate snacks and meals.  Rather he is encouraged to  eat more protein and fat.  This should help his hunger craving. 4. HTN - his bp is well controlled on diet therapy. He does not need to lose weight. I encouraged a low sodium diet.  Anthony Sinner Eman Rynders,MD

## 2020-11-02 ENCOUNTER — Encounter: Payer: Self-pay | Admitting: Gastroenterology

## 2020-11-02 ENCOUNTER — Ambulatory Visit (INDEPENDENT_AMBULATORY_CARE_PROVIDER_SITE_OTHER): Payer: BC Managed Care – PPO | Admitting: Gastroenterology

## 2020-11-02 VITALS — BP 102/72 | HR 69 | Ht 72.0 in | Wt 137.0 lb

## 2020-11-02 DIAGNOSIS — R150 Incomplete defecation: Secondary | ICD-10-CM | POA: Diagnosis not present

## 2020-11-02 NOTE — Progress Notes (Signed)
11/02/2020 Anthony Henderson [id number] [birth year]   HISTORY OF PRESENT ILLNESS: This is a 39 year old male who is a patient of Dr. Vena Rua.  He has been seen here in the past for complaints of nausea and epigastric abdominal pain.  Was actually last seen here about 2 years ago.  He underwent extensive evaluation of those symptoms and no definite cause was identified.  Says that he has actually been doing well from that standpoint.  He is here today with complaints of incomplete emptying of his stools.  He says that he is not constipated, he is having normal bowel movements, but sometimes it feels like he is not completely emptying and will have to return to the bathroom an hour or so after having a bowel movement to pass more stool.  He denies any straining.  He denies any blood in the stool.  He denies any abdominal pain.  This has only been present intermittently over the past few months or so.  He says that actually the past 1 to 2 weeks it seems to be a little bit better.  He says he had a loose bowel movement this morning because he ate a large amount of broccoli yesterday.   Past Medical History:  Diagnosis Date  . Abdominal pain   . Anxiety    "related to SVT"  . Atrial fibrillation ([place])    following RFCA of SVT 03/2014 - on Flecainide  . Depression   . GERD (gastroesophageal reflux disease)   . Hiatal hernia   . High risk sexual behavior   . Other malaise and fatigue   . Panic attack   . Post-operative nausea and vomiting   . [facility]sup[facility]ventricular tachycardia) [facility])    s/p ablation 03-10-14 by Dr Lovena Le   Past Surgical History:  Procedure Laterality Date  . ABLATION  03/10/2014   RFCA of left posterior septal accessory pathyway by Dr Lovena Le with resultant atrial fibrillation  . SUPRAVENTRICULAR TACHYCARDIA ABLATION N/A 03/10/2014   Procedure: SUPRAVENTRICULAR TACHYCARDIA ABLATION;  Surgeon: Evans Lance, MD;  Location: [facility] CATH LAB;  Service: Cardiovascular;  Laterality:  N/A;    reports that he quit smoking about 6 years ago. His smoking use included cigarettes. He has a 2.50 pack-year smoking history. He has never used smokeless tobacco. He reports that he does not drink alcohol and does not use drugs. family history includes Diabetes in his maternal grandmother. Allergies  Allergen Reactions  . Penicillins Hives    Did it involve swelling of the face/tongue/throat, SOB, or low BP? No Did it involve sudden or severe rash/hives, skin peeling, or any reaction on the inside of your mouth or nose? Yes Did you need to seek medical attention at a hospital or doctor's office? No When did it last happen?childhood If all above answers are "NO", may proceed with cephalosporin use.      Outpatient Encounter Medications as of 11/02/2020  Medication Sig  . blood glucose meter kit and supplies KIT Dispense based on patient and insurance preference. Use up to four times daily as directed. (FOR ICD-9 250.00, 250.01).  . busPIRone (BUSPAR) 5 MG tablet Take 5 mg by mouth as needed for anxiety.  . flecainide (TAMBOCOR) 100 MG tablet Take 0.5 tablets (50 mg total) by mouth daily.   No facility-administered encounter medications on file as of 11/02/2020.    REVIEW OF SYSTEMS  : All other systems reviewed and negative except where noted in the History of Present Illness.  PHYSICAL EXAM: BP 102/72   Pulse 69   Ht 6' (1.829 m)   Wt 137 lb (62.1 kg)   SpO2 99%   BMI 18.58 kg/m  General: Well developed AA male in no acute distress Head: Normocephalic and atraumatic Eyes:  Sclerae anicteric, conjunctiva pink. Ears: Normal auditory acuity Lungs: Clear throughout to auscultation; no W/R/R. Heart: Regular rate and rhythm; no M/R/G. Abdomen: Soft, non-distended.  BS present.  Non-tender. Musculoskeletal: Symmetrical with no gross deformities  Skin: No lesions on visible extremities Extremities: No edema  Neurological: Alert oriented x 4, grossly  non-focal Psychological:  Alert and cooperative. Normal mood and affect  ASSESSMENT AND PLAN: *39 year old male with complaints of intermittent sensation of incomplete emptying of his bowels: Reports that at times he has a bowel movement and then an hour or so later has to return to pass more stool.  Says it actually has been better over the past 1 to 2 weeks again now.  No other associated symptoms.  I have asked him to begin taking a daily powder fiber supplement the form of Benefiber 2 teaspoons mixed in 8 ounces of liquid daily to help bulk his stools.  He will advise [place] of his symptoms worsen or continue to the point that it is bothersome to him or if he develops any other symptoms such as bleeding, etc.  We discussed colonoscopy although I think the yield that we would find anything serious or significant is very low and he agreed to defer colonoscopy at this time.   CC:  No ref. provider found

## 2020-11-02 NOTE — Patient Instructions (Signed)
If you are age 41 or older, your body mass index should be between 23-30. Your Body mass index is 18.58 kg/m. If this is out of the aforementioned range listed, please consider follow up with your Primary Care Provider.  If you are age 69 or younger, your body mass index should be between 19-25. Your Body mass index is 18.58 kg/m. If this is out of the aformentioned range listed, please consider follow up with your Primary Care Provider.   Start Benefiber or Citrucel 2 teaspoons in 8 ounces of liquid daily.

## 2020-11-03 NOTE — Progress Notes (Signed)
Addendum: Reviewed and agree with assessment and management plan. Ziyan Schoon M, MD  

## 2020-12-05 DIAGNOSIS — U071 COVID-19: Secondary | ICD-10-CM

## 2020-12-05 HISTORY — DX: COVID-19: U07.1

## 2020-12-07 DIAGNOSIS — Z20822 Contact with and (suspected) exposure to covid-19: Secondary | ICD-10-CM | POA: Diagnosis not present

## 2020-12-12 DIAGNOSIS — Z1152 Encounter for screening for COVID-19: Secondary | ICD-10-CM | POA: Diagnosis not present

## 2020-12-18 DIAGNOSIS — Z8616 Personal history of COVID-19: Secondary | ICD-10-CM | POA: Diagnosis not present

## 2020-12-23 ENCOUNTER — Encounter (HOSPITAL_COMMUNITY): Payer: Self-pay

## 2020-12-23 ENCOUNTER — Ambulatory Visit (HOSPITAL_COMMUNITY)
Admission: EM | Admit: 2020-12-23 | Discharge: 2020-12-23 | Disposition: A | Discharge status: Home / Self Care | Payer: BC Managed Care – PPO | Attending: Emergency Medicine | Admitting: Emergency Medicine

## 2020-12-23 ENCOUNTER — Emergency Department (HOSPITAL_COMMUNITY)
Admission: EM | Admit: 2020-12-23 | Discharge: 2020-12-23 | Discharge status: Absent without leave | Payer: BC Managed Care – PPO

## 2020-12-23 ENCOUNTER — Other Ambulatory Visit: Payer: Self-pay

## 2020-12-23 DIAGNOSIS — R42 Dizziness and giddiness: Secondary | ICD-10-CM

## 2020-12-23 DIAGNOSIS — R519 Headache, unspecified: Secondary | ICD-10-CM

## 2020-12-23 DIAGNOSIS — R112 Nausea with vomiting, unspecified: Secondary | ICD-10-CM

## 2020-12-23 MED ORDER — ONDANSETRON HCL 4 MG PO TABS: 4.0000 mg | ORAL_TABLET | Freq: Four times a day (QID) | ORAL | 0 refills | Status: DC | PRN

## 2020-12-23 NOTE — ED Triage Notes (Signed)
Patient presents to Urgent Care with complaints of a headache, nausea, vomiting, dizziness and intermittent numbness to right side of face. Symptoms have been an on-going issue for 2-3x weeks. Pt states he has been dx with a cystic lesion on his brain and has a appt. with neuro next weds. Pt states he tested positive for covid 3 weeks ago unsure if symptoms are related to covid or his lesion. He states he is borderline diabetic last BG last night at 143.  Denies any changes to speech or vision.

## 2020-12-23 NOTE — ED Provider Notes (Signed)
[place]    CSN: [id number] Arrival date & time: 12/23/20  1327      History   Chief Complaint Chief Complaint  Patient presents with  . Headache    HPI Anthony Henderson is a 40 y.o. male.   Patient presents with 2 to 3-week history of headache, dizziness, nausea, vomiting, intermittent numbness of the right side of his face.  1 episode of emesis today.  He denies fever, chills, chest pain, shortness of breath, abdominal pain, or other symptoms.  He is scheduled to see a neurologist next week.  He tested positive for COVID 3 weeks ago.  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  No acute intracranial abnormality or significant interval change."  The history is provided by the patient and medical records.    Past Medical History:  Diagnosis Date  . Abdominal pain   . Anxiety    "related to SVT"  . Atrial fibrillation ([place])    following RFCA of SVT 03/2014 - on Flecainide  . Depression   . GERD (gastroesophageal reflux disease)   . Hiatal hernia   . High risk sexual behavior   . Other malaise and fatigue   . Panic attack   . Post-operative nausea and vomiting   . SVT (supraventricular tachycardia) [facility])    s/p ablation 03-10-14 by Dr Lovena Le    Patient Active Problem List   Diagnosis Date Noted  . Incomplete passage of stool 11/02/2020  . Palpitations 10/09/2020  . Hypertension 10/09/2020  . 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 Prediabetes 07/30/2019  . Encounter for screening for HIV 07/30/2019  . Change in bowel habit 09/20/2018  . Abdominal pain, epigastric 03/24/2016  . Pyrosis 03/24/2016  . AP (abdominal pain) 10/21/2014  . Chronic nausea 10/21/2014  . Esophageal reflux 08/27/2014  . Paroxysmal supraventricular tachycardia ([place]) 03/10/2014  . PSVT (paroxysmal supraventricular tachycardia) ([place]) 02/07/2014  . SVT (supraventricular  tachycardia) ([place]) 02/03/2014  . Anxiety and depression 05/10/2013    Past Surgical History:  Procedure Laterality Date  . ABLATION  03/10/2014   RFCA of left posterior septal accessory pathyway by Dr Lovena Le with resultant atrial fibrillation  . SUPRAVENTRICULAR TACHYCARDIA ABLATION N/A 03/10/2014   Procedure: SUPRAVENTRICULAR TACHYCARDIA ABLATION;  Surgeon: Evans Lance, MD;  Location: [facility] CATH LAB;  Service: Cardiovascular;  Laterality: N/A;       Home Medications    Prior to Admission medications   Medication Sig Start Date End Date Taking? Authorizing Provider  ondansetron (ZOFRAN) 4 MG tablet Take 1 tablet (4 mg total) by mouth every 6 (six) hours as needed for nausea or vomiting. 12/23/20  Yes Sharion Balloon, NP  blood glucose meter kit and supplies KIT Dispense based on patient and insurance preference. Use up to four times daily as directed. (FOR ICD-9 250.00, 250.01). 08/01/19   Nita Sells, MD  busPIRone (BUSPAR) 5 MG tablet Take 5 mg by mouth as needed for anxiety. 09/28/20   [provider]  flecainide (TAMBOCOR) 100 MG tablet Take 0.5 tablets (50 mg total) by mouth daily. 08/24/20   Evans Lance, MD    Family History Family History  Problem Relation Age of Onset  . Healthy Mother   . Healthy Father   . Diabetes Maternal Grandmother   . Colon cancer Neg Hx   . Esophageal cancer Neg Hx   . Rectal cancer Neg Hx   .  Stomach cancer Neg Hx     Social History Social History   Tobacco Use  . Smoking status: Former Smoker    Packs/day: 0.25    Years: 10.00    Pack years: 2.50    Types: Cigarettes    Quit date: 02/03/2014    Years since quitting: 6.8  . Smokeless tobacco: Never Used  Vaping Use  . Vaping Use: Never used  Substance Use Topics  . Alcohol use: No    Alcohol/week: 0.0 standard drinks    Comment: 03/10/2014 "might have a few drinks 1-2 times/yr, if that"  . Drug use: No     Allergies   Penicillins   Review of Systems Review of  Systems  Constitutional: Negative for chills and fever.  HENT: Negative for ear pain and sore throat.   Eyes: Negative for pain and visual disturbance.  Respiratory: Negative for cough and shortness of breath.   Cardiovascular: Negative for chest pain and palpitations.  Gastrointestinal: Positive for nausea and vomiting. Negative for abdominal pain and diarrhea.  Genitourinary: Negative for dysuria and hematuria.  Musculoskeletal: Negative for arthralgias and back pain.  Skin: Negative for color change and rash.  Neurological: Positive for dizziness, numbness and headaches. Negative for seizures, syncope, facial asymmetry, speech difficulty and weakness.  All other systems reviewed and are negative.    Physical Exam Triage Vital Signs ED Triage Vitals  Enc Vitals Group     BP 12/23/20 1502 131/82     Pulse Rate 12/23/20 1502 72     Resp 12/23/20 1502 16     Temp --      Temp Source 12/23/20 1502 Oral     SpO2 12/23/20 1502 100 %     Weight 12/23/20 1500 138 lb (62.6 kg)     Height --      Head Circumference --      Peak Flow --      Pain Score 12/23/20 1459 5     Pain Loc --      Pain Edu? --      Excl. in GC? --    No data found.  Updated Vital Signs BP 131/82 (BP Location: Left Arm)   Pulse 72   Temp 97.7 F (36.5 C) (Oral)   Resp 16   Wt 138 lb (62.6 kg)   SpO2 100%   BMI 18.72 kg/m   Visual Acuity Right Eye Distance:   Left Eye Distance:   Bilateral Distance:    Right Eye Near:   Left Eye Near:    Bilateral Near:     Physical Exam Vitals and nursing note reviewed.  Constitutional:      General: He is not in acute distress.    Appearance: He is well-developed and well-nourished. He is not ill-appearing.  HENT:     Head: Normocephalic and atraumatic.     Mouth/Throat:     Mouth: Mucous membranes are moist.  Eyes:     Conjunctiva/sclera: Conjunctivae normal.  Cardiovascular:     Rate and Rhythm: Normal rate and regular rhythm.     Heart sounds:  Normal heart sounds.  Pulmonary:     Effort: Pulmonary effort is normal. No respiratory distress.     Breath sounds: Normal breath sounds.  Abdominal:     Palpations: Abdomen is soft.     Tenderness: There is no abdominal tenderness. There is no guarding or rebound.  Musculoskeletal:        General: No edema.       Cervical back: Neck supple.  Skin:    General: Skin is warm and dry.  Neurological:     General: No focal deficit present.     Mental Status: He is alert and oriented to person, place, and time.     Cranial Nerves: No cranial nerve deficit.     Sensory: No sensory deficit.     Motor: No weakness.     Coordination: Romberg sign negative. Coordination normal.     Gait: Gait normal.  Psychiatric:        Mood and Affect: Mood and affect and mood normal.        Behavior: Behavior normal.      UC Treatments / Results  Labs (all labs ordered are listed, but only abnormal results are displayed) Labs Reviewed - No data to display  EKG   Radiology No results found.  Procedures Procedures (including critical care time)  Medications Ordered in UC Medications - No data to display  Initial Impression / Assessment and Plan / UC Course  I have reviewed the triage vital signs and the nursing notes.  Pertinent labs & imaging results that were available during my care of the patient were reviewed by me and considered in my medical decision making (see chart for details).   Nausea with vomiting, dizziness, headache.  Patient refused transfer to the ED.  Treating nausea and vomiting with Zofran.  Instructed patient to keep himself hydrated with clear liquids.  Instructed him to go to the ED if he has acute worsening symptoms and to follow-up with his PCP tomorrow.  Patient agrees to plan of care.   Final Clinical Impressions(s) / UC Diagnoses   Final diagnoses:  Non-intractable vomiting with nausea, unspecified vomiting type  Dizziness  Acute nonintractable headache,  unspecified headache type     Discharge Instructions     Take the antinausea medication as directed.    Keep yourself hydrated with clear liquids, such as water, Gatorade, Pedialyte, Sprite, or ginger ale.    Go to the emergency department if you have acute worsening symptoms.    Follow up with your primary care provider tomorrow.         ED Prescriptions    Medication Sig Dispense Auth. Provider   ondansetron (ZOFRAN) 4 MG tablet Take 1 tablet (4 mg total) by mouth every 6 (six) hours as needed for nausea or vomiting. 12 tablet Sharion Balloon, NP     PDMP not reviewed this encounter.   Sharion Balloon, NP 12/23/20 [phone]

## 2020-12-23 NOTE — Discharge Instructions (Signed)
Take the antinausea medication as directed.    Keep yourself hydrated with clear liquids, such as water, Gatorade, Pedialyte, Sprite, or ginger ale.    Go to the emergency department if you have acute worsening symptoms.    Follow up with your primary care provider tomorrow.

## 2020-12-29 ENCOUNTER — Encounter: Payer: Self-pay | Admitting: *Deleted

## 2020-12-30 ENCOUNTER — Ambulatory Visit: Payer: BC Managed Care – PPO | Admitting: Diagnostic Neuroimaging

## 2020-12-30 ENCOUNTER — Encounter: Payer: Self-pay | Admitting: Diagnostic Neuroimaging

## 2020-12-30 ENCOUNTER — Other Ambulatory Visit: Payer: Self-pay

## 2020-12-30 VITALS — BP 121/80 | HR 75 | Ht 72.0 in | Wt 137.6 lb

## 2020-12-30 DIAGNOSIS — G930 Cerebral cysts: Secondary | ICD-10-CM

## 2020-12-30 DIAGNOSIS — G8929 Other chronic pain: Secondary | ICD-10-CM

## 2020-12-30 DIAGNOSIS — R519 Headache, unspecified: Secondary | ICD-10-CM | POA: Diagnosis not present

## 2020-12-30 DIAGNOSIS — U099 Post covid-19 condition, unspecified: Secondary | ICD-10-CM

## 2020-12-30 MED ORDER — PREDNISONE 10 MG PO TABS: ORAL_TABLET | ORAL | 0 refills | Status: DC

## 2020-12-30 NOTE — Progress Notes (Signed)
[facility]  PATIENT: Anthony Henderson DOB: [birth year]  REFERRING CLINICIAN: Ashok Pall, MD HISTORY FROM: patient  REASON FOR VISIT: new consult    HISTORICAL  CHIEF COMPLAINT:  Chief Complaint  Patient presents with  . 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  History of SVT, anxiety.  June 2021 patient was working in his shed when he came out and accidentally hit his head on the door frame.  He had immediate sharp pain over the top of his head.  The next day he had increasing headache and pain.  Patient went to the emergency room for evaluation and had CT of the head which showed a right temporal lobe cyst with mild edema.  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  He followed up with neurosurgery, had follow-up MRI in September 2021 which showed stability of lesion.  This was felt to be an incidental finding.  Patient's headache and pain from his concussion in June 2021 resolved within 1 to 2 weeks.  Patient has done well throughout 2021.  January 2022 patient had headache and fever, was diagnosed with Covid infection.  Patient had been vaccinated prior to this.  He had ongoing headaches, brain fog and dizziness following this.  He has been taking Tylenol Aleve as needed with mild benefit.  In the last few days symptoms have slightly improved.  Yesterday was a fairly good day and he was able to return to work.  Today he does have some ongoing throbbing headache sensation.  No nausea or vomiting or sensitive light or sound.     REVIEW OF SYSTEMS: Full 14 system review of systems  performed and negative with exception of: As per HPI.  ALLERGIES: Allergies  Allergen Reactions  . Penicillins Hives    Did it involve swelling of the face/tongue/throat, SOB, or low BP? No Did it involve sudden or severe rash/hives, skin peeling, or any reaction on the inside of your mouth or nose? Yes Did you need to seek medical attention at a hospital or doctor's office? No When did it last happen?childhood If all above answers are "NO", may proceed with cephalosporin use.    HOME MEDICATIONS: Outpatient Medications Prior to Visit  Medication Sig Dispense Refill  . blood glucose meter kit and supplies KIT Dispense based on patient and insurance preference. Use up to four times daily as directed. (FOR ICD-9 250.00, 250.01). 1 each 0  . busPIRone (BUSPAR) 5 MG tablet Take 5 mg by mouth as needed for anxiety.    . flecainide (TAMBOCOR) 100 MG tablet Take 0.5 tablets (50 mg total) by mouth daily. 45 tablet 0  . ondansetron (ZOFRAN) 4 MG tablet Take 1 tablet (4 mg total) by mouth every 6 (six) hours as needed for nausea or vomiting. 12 tablet 0   No facility-administered medications prior to visit.    PAST MEDICAL HISTORY: Past Medical History:  Diagnosis Date  . Abdominal pain   . Anxiety    "related to SVT"  . Atrial fibrillation ([place])    following RFCA of SVT 03/2014 - on Flecainide  . COVID 12/2020  . Depression   . GERD (gastroesophageal reflux disease)   .  Headache   . Hiatal hernia   . High risk sexual behavior   . Other malaise and fatigue   . Panic attack   . Post-operative nausea and vomiting   . SVT (supraventricular tachycardia) ([facility])    s/p ablation 03-10-14 by Dr Lovena Le    PAST SURGICAL HISTORY: Past Surgical History:  Procedure Laterality Date  . ABLATION  03/10/2014   RFCA of left posterior septal accessory pathyway by Dr Lovena Le with resultant atrial fibrillation  . SUPRAVENTRICULAR TACHYCARDIA ABLATION N/A 03/10/2014   Procedure: SUPRAVENTRICULAR  TACHYCARDIA ABLATION;  Surgeon: Evans Lance, MD;  Location: [facility] CATH LAB;  Service: Cardiovascular;  Laterality: N/A;    FAMILY HISTORY: Family History  Problem Relation Age of Onset  . Healthy Mother   . Healthy Father   . Diabetes Maternal Grandmother   . Colon cancer Neg Hx   . Esophageal cancer Neg Hx   . Rectal cancer Neg Hx   . Stomach cancer Neg Hx     SOCIAL HISTORY: Social History   Socioeconomic History  . Marital status: Married    Spouse name: Colletta [place]  . Number of children: 3  . Years of education: Not on file  . Highest education level: High school graduate  Occupational History  . Occupation: Time Environmental consultant  Tobacco Use  . Smoking status: Former Smoker    Packs/day: 0.25    Years: 10.00    Pack years: 2.50    Types: Cigarettes    Quit date: 02/03/2014    Years since quitting: 6.9  . Smokeless tobacco: Never Used  Vaping Use  . Vaping Use: Never used  Substance and Sexual Activity  . Alcohol use: Not Currently    Alcohol/week: 0.0 standard drinks    Comment: 03/10/2014 "might have a few drinks 1-2 times/yr, if that"  . Drug use: No  . Sexual activity: Yes  Other Topics Concern  . Not on file  Social History Narrative   Lives with wife   No caffeine   Social Determinants of Health   Financial Resource Strain: Not on file  Food Insecurity: Not on file  Transportation Needs: Not on file  Physical Activity: Not on file  Stress: Not on file  Social Connections: Not on file  Intimate Partner Violence: Not on file     PHYSICAL EXAM  GENERAL EXAM/CONSTITUTIONAL: Vitals:  Vitals:   12/30/20 0842  BP: 121/80  Pulse: 75  Weight: 137 lb 9.6 oz (62.4 kg)  Height: 6' (1.829 m)   Body mass index is 18.66 kg/m. Wt Readings from Last 3 Encounters:  12/30/20 137 lb 9.6 oz (62.4 kg)  12/23/20 138 lb (62.6 kg)  11/02/20 137 lb (62.1 kg)    Patient is in no distress; well developed, nourished and groomed; neck is  supple  CARDIOVASCULAR:  Examination of carotid arteries is normal; no carotid bruits  Regular rate and rhythm, no murmurs  Examination of peripheral vascular system by observation and palpation is normal  EYES:  Ophthalmoscopic exam of optic discs and posterior segments is normal; no papilledema or hemorrhages No exam data present  MUSCULOSKELETAL:  Gait, strength, tone, movements noted in Neurologic exam below  NEUROLOGIC: MENTAL STATUS:  No flowsheet data found.  awake, alert, oriented to person, place and time  recent and remote memory intact  normal attention and concentration  language fluent, comprehension intact, naming intact  fund of knowledge appropriate  CRANIAL NERVE:   2nd - no papilledema on fundoscopic exam  2nd, 3rd, 4th, 6th - pupils equal and reactive to light, visual fields full to confrontation, extraocular muscles intact, no nystagmus  5th - facial sensation symmetric  7th - facial strength symmetric  8th - hearing intact  9th - palate elevates symmetrically, uvula midline  11th - shoulder shrug symmetric  12th - tongue protrusion midline  MOTOR:   normal bulk and tone, full strength in the BUE, BLE  SENSORY:   normal and symmetric to light touch, temperature, vibration  COORDINATION:   finger-nose-finger, fine finger movements normal  REFLEXES:   deep tendon reflexes present and symmetric  GAIT/STATION:   narrow based gait    DIAGNOSTIC DATA (LABS, IMAGING, TESTING) - I reviewed patient records, labs, notes, testing and imaging myself where available.  Lab Results  Component Value Date   WBC 3.9 (L) 05/06/2020   HGB 15.7 05/06/2020   HCT 46.9 05/06/2020   MCV 90.9 05/06/2020   PLT 268 05/06/2020      Component Value Date/Time   NA 139 05/06/2020 2301   K 3.5 05/06/2020 2301   CL 100 05/06/2020 2301   CO2 26 05/06/2020 2301   GLUCOSE 110 (H) 05/06/2020 2301   BUN 14 05/06/2020 2301   CREATININE 1.03  05/06/2020 2301   CREATININE 1.06 05/02/2013 1219   CALCIUM 9.8 05/06/2020 2301   PROT 7.7 12/06/2019 1615   ALBUMIN 4.7 12/06/2019 1615   AST 32 12/06/2019 1615   ALT 14 12/06/2019 1615   ALKPHOS 42 12/06/2019 1615   BILITOT 1.6 (H) 12/06/2019 1615   GFRNONAA >60 05/06/2020 2301   GFRAA >60 05/06/2020 2301   No results found for: CHOL, HDL, LDLCALC, LDLDIRECT, TRIG, CHOLHDL Lab Results  Component Value Date   HGBA1C 6.1 (H) 07/31/2019   Lab Results  Component Value Date   GQQPYPPJ09 326 05/02/2013   Lab Results  Component Value Date   TSH 1.525 02/12/2014    05/06/2020 MRI brain with and without [I reviewed images myself and agree with interpretation. -VRP]  1. 2.9 x 2.4 x 1.7 cm cystic mass centered at the mid right temporal lobe with mild surrounding vasogenic edema. Finding concerning for a primary CNS neoplasm. Primary differential considerations include astrocytoma versus ganglioglioma. Neuro surgical consultation recommended. 2. 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 3. Otherwise normal brain MRI.  08/06/20 MRI brain with and without [I reviewed images myself and agree with interpretation. -VRP]  1. 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 2. No acute intracranial abnormality or significant interval change.     ASSESSMENT AND PLAN  40 y.o. year old male here with new onset headaches, dizziness brain fog in January 2022 following Covid infection.  Also with history of concussion in June 2021.  Also with incidental right temporal lobe lesion found during concussion evaluation in June 2021.  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 Cyst of brain      PLAN:  POST-COVID HEADACHES (headaches, brain fog, dizziness since COVID in Jan 2022) - prednisone pack - may consider migraine  treatments in future (e.g. rizatriptan + amitriptyline)  CONCUSSION (June 2021) - symptoms (HA, pain) resolved  RIGHT TEMPORAL LOBE LESION / CYST (? low grade astrocytoma, ganglioglioma) - likely incidental finding; follow up scan in March 2022 per Dr. Christella Noa  Meds ordered this encounter  Medications  . predniSONE (DELTASONE) 10 MG tablet  Sig: Take 21m on day 1. Reduce by 178meach subsequent day. (60, 50, 40, 30, 20, 10, stop)    Dispense:  21 tablet    Refill:  0   Return in about 6 months (around 06/29/2021).    VIPenni BombardMD 12/05/34/43839:7:79M Certified in Neurology, Neurophysiology and Neuroimaging  [facility] [address] [id number] [phone]

## 2020-12-30 NOTE — Patient Instructions (Signed)
  POST-COVID HEADACHES (headaches, brain fog, dizziness since COVID in Jan 2022) - prednisone pack - may consider migraine treatments in future  CONCUSSION (June 2021) - symptoms (HA, pain) resolved  RIGHT TEMPORAL LOBE LESION / CYST (? low grade astrocytoma vs ganglioglioma) - likely incidental finding; follow up scan in March 2022 per Dr. Christella Noa

## 2021-01-20 ENCOUNTER — Other Ambulatory Visit: Payer: Self-pay | Admitting: Neurosurgery

## 2021-01-20 ENCOUNTER — Other Ambulatory Visit (HOSPITAL_COMMUNITY): Payer: Self-pay | Admitting: Neurosurgery

## 2021-01-20 DIAGNOSIS — D432 Neoplasm of uncertain behavior of brain, unspecified: Secondary | ICD-10-CM

## 2021-02-08 ENCOUNTER — Other Ambulatory Visit: Payer: Self-pay

## 2021-02-08 ENCOUNTER — Ambulatory Visit (HOSPITAL_COMMUNITY)
Admission: RE | Admit: 2021-02-08 | Discharge: 2021-02-08 | Disposition: A | Discharge status: Home / Self Care | Payer: BC Managed Care – PPO | Source: Ambulatory Visit | Attending: Neurosurgery | Admitting: Neurosurgery

## 2021-02-08 DIAGNOSIS — R519 Headache, unspecified: Secondary | ICD-10-CM | POA: Diagnosis not present

## 2021-02-08 DIAGNOSIS — D432 Neoplasm of uncertain behavior of brain, unspecified: Secondary | ICD-10-CM | POA: Insufficient documentation

## 2021-02-08 DIAGNOSIS — G9389 Other specified disorders of brain: Secondary | ICD-10-CM | POA: Diagnosis not present

## 2021-02-08 MED ORDER — GADOBUTROL 1 MMOL/ML IV SOLN
6.5000 mL | Freq: Once | INTRAVENOUS | Status: AC | PRN
  Administered 2021-02-08: 6.5 mL via INTRAVENOUS

## 2021-02-15 DIAGNOSIS — D432 Neoplasm of uncertain behavior of brain, unspecified: Secondary | ICD-10-CM | POA: Diagnosis not present

## 2021-04-29 ENCOUNTER — Encounter (HOSPITAL_COMMUNITY): Payer: Self-pay | Admitting: Emergency Medicine

## 2021-04-29 ENCOUNTER — Emergency Department (HOSPITAL_COMMUNITY)
Admission: EM | Admit: 2021-04-29 | Discharge: 2021-04-29 | Disposition: A | Discharge status: Home / Self Care | Payer: BC Managed Care – PPO | Attending: Emergency Medicine | Admitting: Emergency Medicine

## 2021-04-29 DIAGNOSIS — R531 Weakness: Secondary | ICD-10-CM | POA: Insufficient documentation

## 2021-04-29 DIAGNOSIS — Z20822 Contact with and (suspected) exposure to covid-19: Secondary | ICD-10-CM | POA: Insufficient documentation

## 2021-04-29 DIAGNOSIS — R110 Nausea: Secondary | ICD-10-CM | POA: Insufficient documentation

## 2021-04-29 DIAGNOSIS — Z8616 Personal history of COVID-19: Secondary | ICD-10-CM | POA: Insufficient documentation

## 2021-04-29 DIAGNOSIS — Z87891 Personal history of nicotine dependence: Secondary | ICD-10-CM | POA: Insufficient documentation

## 2021-04-29 DIAGNOSIS — R5383 Other fatigue: Secondary | ICD-10-CM | POA: Insufficient documentation

## 2021-04-29 DIAGNOSIS — I10 Essential (primary) hypertension: Secondary | ICD-10-CM | POA: Insufficient documentation

## 2021-04-29 LAB — COMPREHENSIVE METABOLIC PANEL
ALT: 16 U/L (ref 0–44)
AST: 22 U/L (ref 15–41)
Albumin: 4.3 g/dL (ref 3.5–5.0)
Alkaline Phosphatase: 37 U/L — ABNORMAL LOW (ref 38–126)
Anion gap: 8 (ref 5–15)
BUN: 11 mg/dL (ref 6–20)
CO2: 27 mmol/L (ref 22–32)
Calcium: 9.4 mg/dL (ref 8.9–10.3)
Chloride: 101 mmol/L (ref 98–111)
Creatinine, Ser: 1.07 mg/dL (ref 0.61–1.24)
GFR, Estimated: >60 mL/min (ref >60)
Glucose, Bld: 117 mg/dL — ABNORMAL HIGH (ref 70–99)
Potassium: 3.9 mmol/L (ref 3.5–5.1)
Sodium: 136 mmol/L (ref 135–145)
Total Bilirubin: 0.7 mg/dL (ref 0.3–1.2)
Total Protein: 7.3 g/dL (ref 6.5–8.1)

## 2021-04-29 LAB — CBC
HCT: 45.6 % (ref 39.0–52.0)
Hemoglobin: 15.4 g/dL (ref 13.0–17.0)
MCH: 30.7 pg (ref 26.0–34.0)
MCHC: 33.8 g/dL (ref 30.0–36.0)
MCV: 90.8 fL (ref 80.0–100.0)
Platelets: 287 K/uL (ref 150–400)
RBC: 5.02 MIL/uL (ref 4.22–5.81)
RDW: 11.2 % — ABNORMAL LOW (ref 11.5–15.5)
WBC: 2.3 K/uL — ABNORMAL LOW (ref 4.0–10.5)
nRBC: 0 % (ref 0.0–0.2)

## 2021-04-29 LAB — URINALYSIS, ROUTINE W REFLEX MICROSCOPIC
Bilirubin Urine: NEGATIVE
Glucose, UA: NEGATIVE mg/dL
Hgb urine dipstick: NEGATIVE
Ketones, ur: NEGATIVE mg/dL
Leukocytes,Ua: NEGATIVE
Nitrite: NEGATIVE
Protein, ur: NEGATIVE mg/dL
Specific Gravity, Urine: 1.017 (ref 1.005–1.030)
pH: 9 — ABNORMAL HIGH (ref 5.0–8.0)

## 2021-04-29 LAB — CBG MONITORING, ED: Glucose-Capillary: 122 mg/dL — ABNORMAL HIGH (ref 70–99)

## 2021-04-29 LAB — LIPASE, BLOOD: Lipase: 25 U/L (ref 11–51)

## 2021-04-29 LAB — SARS CORONAVIRUS 2 (TAT 6-24 HRS): SARS Coronavirus 2: NEGATIVE

## 2021-04-29 LAB — ETHANOL: Alcohol, Ethyl (B): <10 mg/dL (ref <10)

## 2021-04-29 MED ORDER — ONDANSETRON HCL 4 MG/2ML IJ SOLN
4.0000 mg | Freq: Once | INTRAMUSCULAR | Status: AC
  Administered 2021-04-29: 4 mg via INTRAVENOUS
  Filled 2021-04-29: qty 2

## 2021-04-29 MED ORDER — SODIUM CHLORIDE 0.9 % IV BOLUS
1000.0000 mL | Freq: Once | INTRAVENOUS | Status: AC
  Administered 2021-04-29: 1000 mL via INTRAVENOUS

## 2021-04-29 NOTE — ED Triage Notes (Signed)
Pt reports weakness for the last couple days. C/o brain fog, possible low blood sugar, no appetite, dehydrated. Triage bs 122.

## 2021-04-29 NOTE — Discharge Instructions (Addendum)
As discussed, treat evaluation has been reassuring.  Your symptoms may be due to your persistent effects of COVID, though a reinfection remains a possibility.  Your test results should be available tonight.  Otherwise, please stay well-hydrated, get plenty of rest and follow-up with your physician in 1 week for repeat evaluation to ensure that you are improving.

## 2021-04-29 NOTE — ED Provider Notes (Signed)
[facility] EMERGENCY DEPARTMENT Provider Note   CSN: [id number] Arrival date & time: 04/29/21  1010     History Chief Complaint  Patient presents with  . Weakness    Anthony Henderson is a 40 y.o. male.  HPI Patient presents with concern of brain fog, generalized weakness, malaise. Patient has a history of COVID January this year, SVT, now status post ablation, and on flecainide, every other day presents with symptoms.  He notes 1 prior similar episode earlier this year, near the time of the COVID diagnosis.  He notes that he was generally improved until a few days ago, 1 without clear precipitant began feeling similar constellation of symptoms including brain fog, malaise.  No focal pain, no focal weakness.  There are some nausea, but no vomiting.  He did have diarrhea yesterday, but notes that he had food which he attributes that to earlier in the evening. No specific confusion. 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  He has been followed by neurology, neurosurgery, and cardiology for his issues. Now, during this recent illness, no clear alleviating or exacerbating factors.  With difficulty performing his work responsibilities he presents for evaluation.    Past Medical History:  Diagnosis Date  . Abdominal pain   . Anxiety    "related to SVT"  . Atrial fibrillation ([place])    following RFCA of SVT 03/2014 - on Flecainide  . COVID 12/2020  . Depression   . GERD (gastroesophageal reflux disease)   . Headache   . Hiatal hernia   . High risk sexual behavior   . Other malaise and fatigue   . Panic attack   . Post-operative nausea and vomiting   . SVT (supraventricular tachycardia) [facility])    s/p ablation 03-10-14 by Dr Lovena Le    Patient Active Problem List   Diagnosis Date Noted  . Incomplete passage of stool 11/02/2020  . Palpitations 10/09/2020  . Hypertension  10/09/2020  . 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 Prediabetes 07/30/2019  . Encounter for screening for HIV 07/30/2019  . Change in bowel habit 09/20/2018  . Abdominal pain, epigastric 03/24/2016  . Pyrosis 03/24/2016  . AP (abdominal pain) 10/21/2014  . Chronic nausea 10/21/2014  . Esophageal reflux 08/27/2014  . Paroxysmal supraventricular tachycardia ([place]) 03/10/2014  . PSVT (paroxysmal supraventricular tachycardia) ([place]) 02/07/2014  . SVT (supraventricular tachycardia) ([place]) 02/03/2014  . Anxiety and depression 05/10/2013    Past Surgical History:  Procedure Laterality Date  . ABLATION  03/10/2014   RFCA of left posterior septal accessory pathyway by Dr Lovena Le with resultant atrial fibrillation  . SUPRAVENTRICULAR TACHYCARDIA ABLATION N/A 03/10/2014   Procedure: SUPRAVENTRICULAR TACHYCARDIA ABLATION;  Surgeon: Evans Lance, MD;  Location: [facility] CATH LAB;  Service: Cardiovascular;  Laterality: N/A;       Family History  Problem Relation Age of Onset  . Healthy Mother   . Healthy Father   . Diabetes Maternal Grandmother   . Colon cancer Neg Hx   . Esophageal cancer Neg Hx   . Rectal cancer Neg Hx   . Stomach cancer Neg Hx     Social History   Tobacco Use  . Smoking status: Former Smoker    Packs/day: 0.25    Years: 10.00    Pack years: 2.50    Types: Cigarettes    Quit date: 02/03/2014    Years since quitting: 7.2  . Smokeless  tobacco: Never Used  Vaping Use  . Vaping Use: Never used  Substance Use Topics  . Alcohol use: Not Currently    Alcohol/week: 0.0 standard drinks    Comment: 03/10/2014 "might have a few drinks 1-2 times/yr, if that"  . Drug use: No    Home Medications Prior to Admission medications   Medication Sig Start Date End Date Taking? Authorizing Provider  blood glucose meter kit and supplies KIT Dispense based on patient and insurance preference. Use up to four times daily as directed. (FOR ICD-9 250.00, 250.01). 08/01/19  Yes Nita Sells, MD   flecainide (TAMBOCOR) 100 MG tablet Take 0.5 tablets (50 mg total) by mouth daily. Patient taking differently: Take 50 mg by mouth every other day. 08/24/20  Yes Evans Lance, MD  naproxen sodium (ALEVE) 220 MG tablet Take 220 mg by mouth as needed (pain).   Yes [provider]  ondansetron (ZOFRAN) 4 MG tablet Take 1 tablet (4 mg total) by mouth every 6 (six) hours as needed for nausea or vomiting. Patient not taking: No sig reported 12/23/20   Sharion Balloon, NP    Allergies    Penicillins  Review of Systems   Review of Systems  Constitutional:       Per HPI, otherwise negative  HENT:       Per HPI, otherwise negative  Respiratory:       Per HPI, otherwise negative  Cardiovascular:       Per HPI, otherwise negative  Gastrointestinal: Positive for diarrhea and nausea. Negative for abdominal pain and vomiting.  Endocrine:       Negative aside from HPI  Genitourinary:       Neg aside from HPI   Musculoskeletal:       Per HPI, otherwise negative  Skin: Negative.   Neurological: Positive for light-headedness. Negative for syncope.    Physical Exam Updated Vital Signs BP 116/70   Pulse (!) 58   Temp 98.1 F (36.7 C) (Oral)   Resp 13   SpO2 100%   Physical Exam Vitals and nursing note reviewed.  Constitutional:      General: He is not in acute distress.    Appearance: He is well-developed.  HENT:     Head: Normocephalic and atraumatic.  Eyes:     Conjunctiva/sclera: Conjunctivae normal.  Cardiovascular:     Rate and Rhythm: Normal rate and regular rhythm.  Pulmonary:     Effort: Pulmonary effort is normal. No respiratory distress.     Breath sounds: No stridor.  Abdominal:     General: There is no distension.  Skin:    General: Skin is warm and dry.  Neurological:     General: No focal deficit present.     Mental Status: He is alert and oriented to person, place, and time.     Cranial Nerves: No cranial nerve deficit.  Psychiatric:        Mood and  Affect: Mood normal.      ED Results / Procedures / Treatments   Labs (all labs ordered are listed, but only abnormal results are displayed) Labs Reviewed  CBC - Abnormal; Notable for the following components:      Result Value   WBC 2.3 (*)    RDW 11.2 (*)    All other components within normal limits  URINALYSIS, ROUTINE W REFLEX MICROSCOPIC - Abnormal; Notable for the following components:   pH 9.0 (*)    All other components within normal limits  COMPREHENSIVE METABOLIC PANEL - Abnormal; Notable for the following components:   Glucose, Bld 117 (*)    Alkaline Phosphatase 37 (*)    All other components within normal limits  CBG MONITORING, ED - Abnormal; Notable for the following components:   Glucose-Capillary 122 (*)    All other components within normal limits  SARS CORONAVIRUS 2 (TAT 6-24 HRS)  ETHANOL  LIPASE, BLOOD  CBG MONITORING, ED    EKG EKG Interpretation  Date/Time:  Thursday Apr 29 2021 10:15:20 EDT Ventricular Rate:  87 PR Interval:  156 QRS Duration: 80 QT Interval:  324 QTC Calculation: 389 R Axis:   87 Text Interpretation: Normal sinus rhythm Septal infarct , age undetermined Abnormal ECG Confirmed by Carmin Muskrat [phone]) on 04/29/2021 11:12:56 AM   Radiology No results found.  Procedures Procedures   Medications Ordered in ED Medications  sodium chloride 0.9 % bolus 1,000 mL (0 mLs Intravenous Stopped 04/29/21 1216)  ondansetron (ZOFRAN) injection 4 mg (4 mg Intravenous Given 04/29/21 1123)    ED Course  I have reviewed the triage vital signs and the nursing notes.  Pertinent labs & imaging results that were available during my care of the patient were reviewed by me and considered in my medical decision making (see chart for details).  Chart review after initial evaluation notable for consideration of MRI studies over the past 3 years with demonstration of the temporal lesion consistent with cystic abnormality, no substantial  progression over the past few years. Patient also designated as sustaining likely post-COVID sequelae with confusion.  1:35 PM On repeat exam the patient is awake, alert, in no distress.  Vital signs unremarkable.  We discussed all findings, and have reviewed his chart, as above. No evidence for bacteremia, sepsis without hypoxia, increased work of breathing, low suspicion for pneumonia.  Some suspicion for sequelae of COVID, though recurrent infection is not out of the room of possibilities.  Patient will have repeat testing performed, but is appropriate to follow this result at home. We discussed options for additional imaging of his known brain lesion, the patient acknowledges this, states that he would like to follow-up with his neurosurgeon in this regard.  Without focal neurologic deficiencies, this is reasonable. Patient discharged in stable condition with outpatient follow-up.  MDM Rules/Calculators/A&P MDM Number of Diagnoses or Management Options Weakness: new, needed workup   Amount and/or Complexity of Data Reviewed Clinical lab tests: ordered and reviewed Tests in the radiology section of CPT: reviewed Tests in the medicine section of CPT: reviewed and ordered Decide to obtain previous medical records or to obtain history from someone other than the patient: yes Review and summarize past medical records: yes Independent visualization of images, tracings, or specimens: yes  Risk of Complications, Morbidity, and/or Mortality Presenting problems: high Diagnostic procedures: high Management options: high  Critical Care Total time providing critical care: < 30 minutes  Patient Progress Patient progress: stable  Final Clinical Impression(s) / ED Diagnoses Final diagnoses:  Weakness     Carmin Muskrat, MD 04/29/21 1337

## 2021-04-29 NOTE — ED Notes (Signed)
Got patient into a gown on the monitor patient is resting with call bell in reach 

## 2021-06-24 ENCOUNTER — Other Ambulatory Visit: Payer: Self-pay | Admitting: Urgent Care

## 2021-06-24 ENCOUNTER — Telehealth: Payer: Self-pay | Admitting: Hematology

## 2021-06-24 DIAGNOSIS — E119 Type 2 diabetes mellitus without complications: Secondary | ICD-10-CM

## 2021-06-24 DIAGNOSIS — E139 Other specified diabetes mellitus without complications: Secondary | ICD-10-CM

## 2021-06-24 NOTE — Telephone Encounter (Signed)
Scheduled appt per referral. Pt aware.  

## 2021-06-29 ENCOUNTER — Ambulatory Visit: Payer: BC Managed Care – PPO | Admitting: Diagnostic Neuroimaging

## 2021-06-30 NOTE — Progress Notes (Signed)
HEMATOLOGY/ONCOLOGY CONSULTATION NOTE  Date of Service: 06/30/2021  Patient Care Team: Letitia Neri as PCP - General (Physician Assistant)  CHIEF COMPLAINTS/PURPOSE OF CONSULTATION:  Leukopenia  HISTORY OF PRESENTING ILLNESS:   Anthony Henderson is a wonderful 40 y.o. male who has been referred to [place] by Geryl Councilman, PA-C for evaluation and management of leukopenia. The pt reports that he is doing well overall.  Lab results 04/29/2021 of CBC and CMP is as follows: all values are WNL except for WBC of 2.3K (differential not done), RDW of 11.2, Glucose of 117, Alkaline Phosphatase of 37.  Patient reports that he has recently had labs from [place] this month in the last couple of weeks and her his WBC count was up to 3.2k with normal hemoglobin and platelet count.  The pt reports no issues with frequent infections.  No new bone pains.  No fevers no chills no night sweats.  No new lumps or bumps.. Notes that he had COVID in January 2022 and is still having some issues with fatigue brain fog and a sense of malaise.  He does not report any fixed physical limitations and is still working as a Geophysical data processor for a [employer].  He is accompanied by his wife who notes that he has been having significant anxieties in general and that seems to be keeping him from doing physical activities.  He reports that he was diagnosed with atrial fibrillation and has been on flecainide and anticoagulation for this for 3-4 years.  Patient denies using any drugs No unusual over-the-counter supplements . He has been having apparent issues with low glucose levels which is being evaluated by his primary care physician.  On review of systems, pt reports significant issues with anxiety which we discussed about addressing.  MEDICAL HISTORY:  Past Medical History:  Diagnosis Date   Abdominal pain    Anxiety    "related to SVT"   Atrial fibrillation ([place])    following RFCA of SVT 03/2014  - on Flecainide   COVID 12/2020   Depression    GERD (gastroesophageal reflux disease)    Headache    Hiatal hernia    High risk sexual behavior    Other malaise and fatigue    Panic attack    Post-operative nausea and vomiting    SVT (supraventricular tachycardia) ([facility])    s/p ablation 03-10-14 by Dr Lovena Le  Rhabdomyolysis  SURGICAL HISTORY: Past Surgical History:  Procedure Laterality Date   ABLATION  03/10/2014   RFCA of left posterior septal accessory pathyway by Dr Lovena Le with resultant atrial fibrillation   SUPRAVENTRICULAR TACHYCARDIA ABLATION N/A 03/10/2014   Procedure: SUPRAVENTRICULAR TACHYCARDIA ABLATION;  Surgeon: Evans Lance, MD;  Location: [facility] CATH LAB;  Service: Cardiovascular;  Laterality: N/A;    SOCIAL HISTORY: Social History   Socioeconomic History   Marital status: Married    Spouse name: Colletta [place]   Number of children: 3   Years of education: Not on file   Highest education level: High school graduate  Occupational History   Occupation: Time Environmental consultant  Tobacco Use   Smoking status: Former    Packs/day: 0.25    Years: 10.00    Pack years: 2.50    Types: Cigarettes    Quit date: 02/03/2014    Years since quitting: 7.4   Smokeless tobacco: Never  Vaping Use   Vaping Use: Never used  Substance and Sexual Activity   Alcohol use: Not Currently  Alcohol/week: 0.0 standard drinks    Comment: 03/10/2014 "might have a few drinks 1-2 times/yr, if that"   Drug use: No   Sexual activity: Yes  Other Topics Concern   Not on file  Social History Narrative   Lives with wife   No caffeine   Social Determinants of Health   Financial Resource Strain: Not on file  Food Insecurity: Not on file  Transportation Needs: Not on file  Physical Activity: Not on file  Stress: Not on file  Social Connections: Not on file  Intimate Partner Violence: Not on file    FAMILY HISTORY: Family History  Problem Relation Age of Onset   Healthy Mother     Healthy Father    Diabetes Maternal Grandmother    Colon cancer Neg Hx    Esophageal cancer Neg Hx    Rectal cancer Neg Hx    Stomach cancer Neg Hx      ALLERGIES:  is allergic to penicillins.  MEDICATIONS:  Current Outpatient Medications  Medication Sig Dispense Refill   blood glucose meter kit and supplies KIT Dispense based on patient and insurance preference. Use up to four times daily as directed. (FOR ICD-9 250.00, 250.01). 1 each 0   flecainide (TAMBOCOR) 100 MG tablet Take 0.5 tablets (50 mg total) by mouth daily. (Patient taking differently: Take 50 mg by mouth every other day.) 45 tablet 0   naproxen sodium (ALEVE) 220 MG tablet Take 220 mg by mouth as needed (pain).     ondansetron (ZOFRAN) 4 MG tablet Take 1 tablet (4 mg total) by mouth every 6 (six) hours as needed for nausea or vomiting. (Patient not taking: No sig reported) 12 tablet 0   No current facility-administered medications for this visit.    REVIEW OF SYSTEMS:    10 Point review of Systems was done is negative except as noted above.  PHYSICAL EXAMINATION: ECOG PERFORMANCE STATUS: 1 - Symptomatic but completely ambulatory  . Vitals:   07/01/21 1311  BP: 123/81  Pulse: 65  Resp: 17  Temp: 98.7 F (37.1 C)  SpO2: 95%   Filed Weights   07/01/21 1311  Weight: 133 lb 12.8 oz (60.7 kg)   .Body mass index is 18.15 kg/m.  NAD GENERAL:alert, in no acute distress and comfortable SKIN: no acute rashes, no significant lesions EYES: conjunctiva are pink and non-injected, sclera anicteric OROPHARYNX: MMM, no exudates, no oropharyngeal erythema or ulceration NECK: supple, no JVD LYMPH:  no palpable lymphadenopathy in the cervical, axillary or inguinal regions LUNGS: clear to auscultation b/l with normal respiratory effort HEART: regular rate & rhythm ABDOMEN:  normoactive bowel sounds , non tender, not distended. Extremity: no pedal edema PSYCH: alert & oriented x 3 with fluent speech NEURO: no focal  motor/sensory deficits  LABORATORY DATA:  I have reviewed the data as listed  . CBC Latest Ref Rng & Units 04/29/2021 05/06/2020 12/06/2019  WBC 4.0 - 10.5 K/uL 2.3(L) 3.9(L) 3.9(L)  Hemoglobin 13.0 - 17.0 g/dL 15.4 15.7 17.9(H)  Hematocrit 39.0 - 52.0 % 45.6 46.9 54.1(H)  Platelets 150 - 400 K/uL 287 268 282    . CMP Latest Ref Rng & Units 04/29/2021 05/06/2020 12/06/2019  Glucose 70 - 99 mg/dL 117(H) 110(H) 98  BUN 6 - 20 mg/dL 11 14 14  Creatinine 0.61 - 1.24 mg/dL 1.07 1.03 1.19  Sodium 135 - 145 mmol/L 136 139 135  Potassium 3.5 - 5.1 mmol/L 3.9 3.5 5.3(H)  Chloride 98 - 111 mmol/L 101 100   100  CO2 22 - 32 mmol/L 27 26 23  Calcium 8.9 - 10.3 mg/dL 9.4 9.8 9.6  Total Protein 6.5 - 8.1 g/dL 7.3 - 7.7  Total Bilirubin 0.3 - 1.2 mg/dL 0.7 - 1.6(H)  Alkaline Phos 38 - 126 U/L 37(L) - 42  AST 15 - 41 U/L 22 - 32  ALT 0 - 44 U/L 16 - 14     RADIOGRAPHIC STUDIES: I have personally reviewed the radiological images as listed and agreed with the findings in the report. No results found.  ASSESSMENT & PLAN:   39-year-old male with history of atrial fibrillation, SVT referred for  1) Isolated leukopenia No anemia no thrombocytopenia His WBC count was 2.3k no differential available.  Recent labs at [facility] couple of weeks ago showed his white counts are improved to 3.2k  PLAN: -We discussed available results in detail and noted that improvement in his white counts spontaneously over time is relatively reassuring. -We discussed that flecainide can cause leukopenia but if it is mild that would not be a reason to change this important medication. -With discussed that there is also possible that you have benign ethnic neutropenia. -He has not had any overt constitutional symptoms lymphadenopathy or splenomegaly to suggest an obvious lymphoproliferative disorder. -We discussed getting labs today but he prefers to have this done at interval and pursue a conservative work-up and follow-up. -We  discussed empirically starting a vitamin B complex to address any potential nutritional deficiencies. -Triggers for his anxiety and anxiety management were discussed in detail. -We recommended avoiding alcohol over-the-counter NSAIDs and other medications that could affect his white blood counts.  He denies using any drugs or vaping at this time. -He has been getting worked up for low blood sugar levels and notes that he has been scheduled for imaging of his pancreas by his primary care providers. -He also has symptoms of long COVID which he will pursue with his primary care physician   FOLLOW UP: RTC with Dr Kale with labs in 2 months. Plz schedule these labs 1 week prior to clinic visit     All of the patients questions were answered with apparent satisfaction. The patient knows to call the clinic with any problems, questions or concerns.  I spent 30 minutes counseling the patient face to face. The total time spent in the appointment was 45minutes and more than 50% was on counseling and direct patient cares.    Gautam Kale MD MS AAHIVMS SCH CTH Hematology/Oncology Physician [facility]  (Office):       [phone] (Work cell):  [phone] (Fax):           [phone]  06/30/2021 8:36 PM  I, Ross Judd, am acting as scribe for Dr. Gautam Kale, MD.   .I have reviewed the above documentation for accuracy and completeness, and I agree with the above. .Gautam Kishore Kale MD  

## 2021-07-01 ENCOUNTER — Inpatient Hospital Stay: Payer: PRIVATE HEALTH INSURANCE | Attending: Hematology | Admitting: Hematology

## 2021-07-01 ENCOUNTER — Other Ambulatory Visit: Payer: Self-pay

## 2021-07-01 VITALS — BP 123/81 | HR 65 | Temp 98.7°F | Resp 17 | Ht 72.0 in | Wt 133.8 lb

## 2021-07-01 DIAGNOSIS — Z79899 Other long term (current) drug therapy: Secondary | ICD-10-CM | POA: Insufficient documentation

## 2021-07-01 DIAGNOSIS — D72819 Decreased white blood cell count, unspecified: Secondary | ICD-10-CM | POA: Diagnosis present

## 2021-07-01 DIAGNOSIS — I4891 Unspecified atrial fibrillation: Secondary | ICD-10-CM | POA: Diagnosis not present

## 2021-07-02 ENCOUNTER — Telehealth: Payer: Self-pay | Admitting: Hematology

## 2021-07-02 NOTE — Telephone Encounter (Signed)
Left message with follow-up appointments per 7/28 los. 

## 2021-07-08 ENCOUNTER — Ambulatory Visit
Admission: RE | Admit: 2021-07-08 | Discharge: 2021-07-08 | Disposition: A | Discharge status: Home / Self Care | Payer: PRIVATE HEALTH INSURANCE | Source: Ambulatory Visit | Attending: Urgent Care | Admitting: Urgent Care

## 2021-07-08 ENCOUNTER — Other Ambulatory Visit: Payer: Self-pay

## 2021-07-08 DIAGNOSIS — E139 Other specified diabetes mellitus without complications: Secondary | ICD-10-CM

## 2021-07-08 DIAGNOSIS — E119 Type 2 diabetes mellitus without complications: Secondary | ICD-10-CM

## 2021-07-12 ENCOUNTER — Other Ambulatory Visit: Payer: Self-pay | Admitting: Urgent Care

## 2021-07-12 ENCOUNTER — Other Ambulatory Visit (HOSPITAL_COMMUNITY): Payer: Self-pay | Admitting: Neurosurgery

## 2021-07-12 ENCOUNTER — Other Ambulatory Visit: Payer: Self-pay | Admitting: Neurosurgery

## 2021-07-12 DIAGNOSIS — D432 Neoplasm of uncertain behavior of brain, unspecified: Secondary | ICD-10-CM

## 2021-07-12 DIAGNOSIS — K769 Liver disease, unspecified: Secondary | ICD-10-CM

## 2021-07-18 ENCOUNTER — Other Ambulatory Visit: Payer: Self-pay

## 2021-07-18 ENCOUNTER — Ambulatory Visit
Admission: RE | Admit: 2021-07-18 | Discharge: 2021-07-18 | Disposition: A | Discharge status: Home / Self Care | Payer: PRIVATE HEALTH INSURANCE | Source: Ambulatory Visit | Attending: Urgent Care | Admitting: Urgent Care

## 2021-07-18 DIAGNOSIS — K769 Liver disease, unspecified: Secondary | ICD-10-CM

## 2021-07-18 MED ORDER — GADOBENATE DIMEGLUMINE 529 MG/ML IV SOLN
15.0000 mL | Freq: Once | INTRAVENOUS | Status: AC | PRN
  Administered 2021-07-18: 15 mL via INTRAVENOUS

## 2021-07-24 ENCOUNTER — Ambulatory Visit (HOSPITAL_COMMUNITY)
Admission: EM | Admit: 2021-07-24 | Discharge: 2021-07-24 | Disposition: A | Discharge status: Home / Self Care | Payer: PRIVATE HEALTH INSURANCE | Attending: Internal Medicine | Admitting: Internal Medicine

## 2021-07-24 ENCOUNTER — Ambulatory Visit (INDEPENDENT_AMBULATORY_CARE_PROVIDER_SITE_OTHER): Payer: PRIVATE HEALTH INSURANCE

## 2021-07-24 ENCOUNTER — Other Ambulatory Visit: Payer: Self-pay

## 2021-07-24 ENCOUNTER — Encounter (HOSPITAL_COMMUNITY): Payer: Self-pay

## 2021-07-24 DIAGNOSIS — M79641 Pain in right hand: Secondary | ICD-10-CM

## 2021-07-24 MED ORDER — PREDNISONE 10 MG PO TABS: 40.0000 mg | ORAL_TABLET | Freq: Every day | ORAL | 0 refills | Status: DC

## 2021-07-24 MED ORDER — NAPROXEN 500 MG PO TABS: 500.0000 mg | ORAL_TABLET | Freq: Two times a day (BID) | ORAL | 0 refills | Status: DC

## 2021-07-24 NOTE — ED Provider Notes (Signed)
[facility]    CSN: [id number] Arrival date & time: 07/24/21  1010      History   Chief Complaint Chief Complaint  Patient presents with   Hand Injury    HPI Anthony Henderson is a 40 y.o. male.   Patient presents with right hand pain and swelling after hitting the back hand onto the wall of house yesterday evening.  unable to close hand to fist but able to fully open hand but does elicit pain.  Denies numbness, tingling, prior injury or trauma.  Has not attempted treatment.  Past Medical History:  Diagnosis Date   Abdominal pain    Anxiety    "related to SVT"   Atrial fibrillation ([place])    following RFCA of SVT 03/2014 - on Flecainide   COVID 12/2020   Depression    GERD (gastroesophageal reflux disease)    Headache    Hiatal hernia    High risk sexual behavior    Other malaise and fatigue    Panic attack    Post-operative nausea and vomiting    SVT (supraventricular tachycardia) ([place])    s/p ablation 03-10-14 by Dr Lovena Le    Patient Active Problem List   Diagnosis Date Noted   Incomplete passage of stool 11/02/2020   Palpitations 10/09/2020   Hypertension 10/09/2020   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   Prediabetes 07/30/2019   Encounter for screening for HIV 07/30/2019   Change in bowel habit 09/20/2018   Abdominal pain, epigastric 03/24/2016   Pyrosis 03/24/2016   AP (abdominal pain) 10/21/2014   Chronic nausea 10/21/2014   Esophageal reflux 08/27/2014   Paroxysmal supraventricular tachycardia (HCC) 03/10/2014   PSVT (paroxysmal supraventricular tachycardia) ([place]) 02/07/2014   SVT (supraventricular tachycardia) ([place]) 02/03/2014   Anxiety and depression 05/10/2013    Past Surgical History:  Procedure Laterality Date   ABLATION  03/10/2014   RFCA of left posterior septal accessory pathyway by Dr Lovena Le with resultant atrial fibrillation   SUPRAVENTRICULAR TACHYCARDIA ABLATION N/A 03/10/2014   Procedure: SUPRAVENTRICULAR TACHYCARDIA ABLATION;  Surgeon:  Evans Lance, MD;  Location: [facility] CATH LAB;  Service: Cardiovascular;  Laterality: N/A;       Home Medications    Prior to Admission medications   Medication Sig Start Date End Date Taking? Authorizing Provider  naproxen (NAPROSYN) 500 MG tablet Take 1 tablet (500 mg total) by mouth 2 (two) times daily. 07/24/21  Yes Bruin Bolger R, NP  predniSONE (DELTASONE) 10 MG tablet Take 4 tablets (40 mg total) by mouth daily. 07/24/21  Yes Kendyl Bissonnette, Leitha Schuller, NP  blood glucose meter kit and supplies KIT Dispense based on patient and insurance preference. Use up to four times daily as directed. (FOR ICD-9 250.00, 250.01). 08/01/19   Nita Sells, MD  flecainide (TAMBOCOR) 100 MG tablet Take 0.5 tablets (50 mg total) by mouth daily. Patient taking differently: Take 50 mg by mouth every other day. 08/24/20   Evans Lance, MD  ondansetron (ZOFRAN) 4 MG tablet Take 1 tablet (4 mg total) by mouth every 6 (six) hours as needed for nausea or vomiting. 12/23/20   Sharion Balloon, NP    Family History Family History  Problem Relation Age of Onset   Healthy Mother    Healthy Father    Diabetes Maternal Grandmother    Colon cancer Neg Hx    Esophageal cancer Neg Hx    Rectal cancer Neg Hx    Stomach cancer Neg Hx     Social History Social  History   Tobacco Use   Smoking status: Former    Packs/day: 0.25    Years: 10.00    Pack years: 2.50    Types: Cigarettes    Quit date: 02/03/2014    Years since quitting: 7.4   Smokeless tobacco: Never  Vaping Use   Vaping Use: Never used  Substance Use Topics   Alcohol use: Not Currently    Alcohol/week: 0.0 standard drinks    Comment: 03/10/2014 "might have a few drinks 1-2 times/yr, if that"   Drug use: No     Allergies   Penicillins   Review of Systems Review of Systems  Constitutional: Negative.   Respiratory: Negative.    Musculoskeletal:  Positive for joint swelling. Negative for arthralgias, back pain, gait problem, myalgias, neck  pain and neck stiffness.  Neurological: Negative.     Physical Exam Triage Vital Signs ED Triage Vitals [07/24/21 1054]  Enc Vitals Group     BP 132/87     Pulse Rate 73     Resp 18     Temp 98.1 F (36.7 C)     Temp Source Oral     SpO2 98 %     Weight      Height      Head Circumference      Peak Flow      Pain Score 9     Pain Loc      Pain Edu?      Excl. in [place]?    No data found.  Updated Vital Signs BP 132/87 (BP Location: Right Arm)   Pulse 73   Temp 98.1 F (36.7 C) (Oral)   Resp 18   SpO2 98%   Visual Acuity Right Eye Distance:   Left Eye Distance:   Bilateral Distance:    Right Eye Near:   Left Eye Near:    Bilateral Near:     Physical Exam Constitutional:      Appearance: Normal appearance. He is normal weight.  HENT:     Head: Normocephalic.  Eyes:     Extraocular Movements: Extraocular movements intact.  Pulmonary:     Effort: Pulmonary effort is normal.  Skin:    General: Skin is warm and dry.  Neurological:     Mental Status: He is alert and oriented to person, place, and time. Mental status is at baseline.  Psychiatric:        Mood and Affect: Mood normal.        Behavior: Behavior normal.     UC Treatments / Results  Labs (all labs ordered are listed, but only abnormal results are displayed) Labs Reviewed - No data to display  EKG   Radiology DG Hand Complete Right  Result Date: 07/24/2021 CLINICAL DATA:  Posttraumatic right hand pain EXAM: RIGHT HAND - COMPLETE 3+ VIEW COMPARISON:  None. FINDINGS: There is no evidence of fracture or dislocation. There is no evidence of arthropathy or other focal bone abnormality. Soft tissues are unremarkable. IMPRESSION: Negative. Electronically Signed   By: Monte Fantasia M.D.   On: 07/24/2021 11:18    Procedures Procedures (including critical care time)  Medications Ordered in UC Medications - No data to display  Initial Impression / Assessment and Plan / UC Course  I have reviewed  the triage vital signs and the nursing notes.  Pertinent labs & imaging results that were available during my care of the patient were reviewed by me and considered in my medical decision making (see  chart for details).  Right hand pain  1.  Hand x-ray negative 2.  Prednisone 40 mg daily for 5 days 3.  Naproxen 500 mg twice daily for 5 days at completion of prednisone, then as needed 4.  Advised Tylenol for breakthrough pain while on use of medications 5.  Heating pad in 15-minute intervals as needed 6.  Follow-up in 2 weeks for persistent pain with urgent care or with orthopedic specialist Final Clinical Impressions(s) / UC Diagnoses   Final diagnoses:  Right hand pain     Discharge Instructions      Your x-ray did not show injury to the bone, ligaments or tendon tendons therefore your pain and swelling should gradually improve with time  Take prednisone every morning with a little bit of food for the next 5 days, once completed take naproxen twice a day for 5 days then as needed  If needing additional comfort throughout the day while on these medications you may use Tylenol 650 mg every 6 hours as needed for pain  Can apply heating pad in 15-minute intervals to affected area for additional comfort  After consistent use of medication and pain and swelling is still very prominent and you are still hurting able to fully move your hand please follow-up in urgent care with orthopedic specialist, information listed below for reevaluation   ED Prescriptions     Medication Sig Dispense Auth. Provider   predniSONE (DELTASONE) 10 MG tablet Take 4 tablets (40 mg total) by mouth daily. 20 tablet Armilda Vanderlinden R, NP   naproxen (NAPROSYN) 500 MG tablet Take 1 tablet (500 mg total) by mouth 2 (two) times daily. 30 tablet Hans Eden, NP      PDMP not reviewed this encounter.   Hans Eden, [place] 07/24/21 1202

## 2021-07-24 NOTE — ED Triage Notes (Signed)
Pt presents with right hand injury after banging his hand on the foundation of his house yesterday after trying to bathe his dog.

## 2021-07-24 NOTE — Discharge Instructions (Addendum)
Your x-ray did not show injury to the bone, ligaments or tendon tendons therefore your pain and swelling should gradually improve with time  Take prednisone every morning with a little bit of food for the next 5 days, once completed take naproxen twice a day for 5 days then as needed  If needing additional comfort throughout the day while on these medications you may use Tylenol 650 mg every 6 hours as needed for pain  Can apply heating pad in 15-minute intervals to affected area for additional comfort  After consistent use of medication and pain and swelling is still very prominent and you are still hurting able to fully move your hand please follow-up in urgent care with orthopedic specialist, information listed below for reevaluation

## 2021-07-26 ENCOUNTER — Other Ambulatory Visit: Payer: Self-pay

## 2021-07-26 ENCOUNTER — Ambulatory Visit (HOSPITAL_COMMUNITY)
Admission: RE | Admit: 2021-07-26 | Discharge: 2021-07-26 | Disposition: A | Discharge status: Home / Self Care | Payer: PRIVATE HEALTH INSURANCE | Source: Ambulatory Visit | Attending: Neurosurgery | Admitting: Neurosurgery

## 2021-07-26 DIAGNOSIS — D432 Neoplasm of uncertain behavior of brain, unspecified: Secondary | ICD-10-CM | POA: Diagnosis present

## 2021-07-26 MED ORDER — GADOBUTROL 1 MMOL/ML IV SOLN
6.0000 mL | Freq: Once | INTRAVENOUS | Status: AC | PRN
  Administered 2021-07-26: 6 mL via INTRAVENOUS

## 2021-08-01 NOTE — Progress Notes (Deleted)
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

## 2021-08-02 ENCOUNTER — Encounter: Payer: PRIVATE HEALTH INSURANCE | Admitting: Internal Medicine

## 2021-08-25 ENCOUNTER — Inpatient Hospital Stay: Payer: PRIVATE HEALTH INSURANCE

## 2021-08-27 ENCOUNTER — Other Ambulatory Visit: Payer: Self-pay

## 2021-08-27 ENCOUNTER — Inpatient Hospital Stay: Payer: PRIVATE HEALTH INSURANCE | Attending: Hematology

## 2021-08-27 DIAGNOSIS — Z7952 Long term (current) use of systemic steroids: Secondary | ICD-10-CM | POA: Diagnosis not present

## 2021-08-27 DIAGNOSIS — Z79899 Other long term (current) drug therapy: Secondary | ICD-10-CM | POA: Insufficient documentation

## 2021-08-27 DIAGNOSIS — I4891 Unspecified atrial fibrillation: Secondary | ICD-10-CM | POA: Insufficient documentation

## 2021-08-27 DIAGNOSIS — D72819 Decreased white blood cell count, unspecified: Secondary | ICD-10-CM | POA: Diagnosis not present

## 2021-08-27 DIAGNOSIS — Z8616 Personal history of COVID-19: Secondary | ICD-10-CM | POA: Insufficient documentation

## 2021-08-27 LAB — CBC WITH DIFFERENTIAL/PLATELET
Abs Immature Granulocytes: 0 10*3/uL (ref 0.00–0.07)
Basophils Absolute: 0 10*3/uL (ref 0.0–0.1)
Basophils Relative: 1 %
Eosinophils Absolute: 0 10*3/uL (ref 0.0–0.5)
Eosinophils Relative: 1 %
HCT: 44.1 % (ref 39.0–52.0)
Hemoglobin: 14.9 g/dL (ref 13.0–17.0)
Immature Granulocytes: 0 %
Lymphocytes Relative: 38 %
Lymphs Abs: 1.4 10*3/uL (ref 0.7–4.0)
MCH: 30.1 pg (ref 26.0–34.0)
MCHC: 33.8 g/dL (ref 30.0–36.0)
MCV: 89.1 fL (ref 80.0–100.0)
Monocytes Absolute: 0.2 10*3/uL (ref 0.1–1.0)
Monocytes Relative: 7 %
Neutro Abs: 2 10*3/uL (ref 1.7–7.7)
Neutrophils Relative %: 53 %
Platelets: 255 10*3/uL (ref 150–400)
RBC: 4.95 MIL/uL (ref 4.22–5.81)
RDW: 11.2 % — ABNORMAL LOW (ref 11.5–15.5)
WBC: 3.6 10*3/uL — ABNORMAL LOW (ref 4.0–10.5)
nRBC: 0 % (ref 0.0–0.2)

## 2021-08-27 LAB — CMP (CANCER CENTER ONLY)
ALT: 14 U/L (ref 0–44)
AST: 18 U/L (ref 15–41)
Albumin: 4.3 g/dL (ref 3.5–5.0)
Alkaline Phosphatase: 57 U/L (ref 38–126)
Anion gap: 9 (ref 5–15)
BUN: 16 mg/dL (ref 6–20)
CO2: 28 mmol/L (ref 22–32)
Calcium: 9.5 mg/dL (ref 8.9–10.3)
Chloride: 102 mmol/L (ref 98–111)
Creatinine: 1.26 mg/dL — ABNORMAL HIGH (ref 0.61–1.24)
GFR, Estimated: >60 mL/min (ref >60)
Glucose, Bld: 140 mg/dL — ABNORMAL HIGH (ref 70–99)
Potassium: 4 mmol/L (ref 3.5–5.1)
Sodium: 139 mmol/L (ref 135–145)
Total Bilirubin: 0.6 mg/dL (ref 0.3–1.2)
Total Protein: 7.2 g/dL (ref 6.5–8.1)

## 2021-08-27 LAB — LACTATE DEHYDROGENASE: LDH: 166 U/L (ref 98–192)

## 2021-08-27 LAB — SEDIMENTATION RATE: Sed Rate: 1 mm/hr (ref 0–16)

## 2021-08-27 LAB — VITAMIN B12: Vitamin B-12: 210 pg/mL (ref 180–914)

## 2021-08-27 LAB — HIV ANTIBODY (ROUTINE TESTING W REFLEX): HIV Screen 4th Generation wRfx: NONREACTIVE

## 2021-08-27 LAB — HEPATITIS C ANTIBODY: HCV Ab: NONREACTIVE

## 2021-08-29 LAB — FOLATE RBC
Folate, Hemolysate: 497 ng/mL
Folate, RBC: 1085 ng/mL (ref >498)
Hematocrit: 45.8 % (ref 37.5–51.0)

## 2021-08-31 LAB — MULTIPLE MYELOMA PANEL, SERUM
Albumin SerPl Elph-Mcnc: 4.1 g/dL (ref 2.9–4.4)
Albumin/Glob SerPl: 1.7 (ref 0.7–1.7)
Alpha 1: 0.2 g/dL (ref 0.0–0.4)
Alpha2 Glob SerPl Elph-Mcnc: 0.5 g/dL (ref 0.4–1.0)
B-Globulin SerPl Elph-Mcnc: 0.8 g/dL (ref 0.7–1.3)
Gamma Glob SerPl Elph-Mcnc: 1.1 g/dL (ref 0.4–1.8)
Globulin, Total: 2.5 g/dL (ref 2.2–3.9)
IgA: 180 mg/dL (ref 90–386)
IgG (Immunoglobin G), Serum: 1182 mg/dL (ref 603–1613)
IgM (Immunoglobulin M), Srm: 63 mg/dL (ref 20–172)
Total Protein ELP: 6.6 g/dL (ref 6.0–8.5)

## 2021-09-01 ENCOUNTER — Other Ambulatory Visit: Payer: Self-pay

## 2021-09-01 ENCOUNTER — Inpatient Hospital Stay (HOSPITAL_BASED_OUTPATIENT_CLINIC_OR_DEPARTMENT_OTHER): Payer: PRIVATE HEALTH INSURANCE | Admitting: Hematology

## 2021-09-01 VITALS — BP 120/81 | HR 67 | Temp 98.4°F | Resp 17 | Wt 140.6 lb

## 2021-09-01 DIAGNOSIS — D72819 Decreased white blood cell count, unspecified: Secondary | ICD-10-CM | POA: Diagnosis not present

## 2021-09-08 NOTE — Progress Notes (Signed)
HEMATOLOGY/ONCOLOGY CONSULTATION NOTE  Date of Service: 09/08/2021  Patient Care Team: Anthony Henderson, [place] as PCP - General (Physician Assistant)  CHIEF COMPLAINTS/PURPOSE OF CONSULTATION:  Leukopenia  HISTORY OF PRESENTING ILLNESS:   Anthony Henderson is a wonderful 40 y.o. male who has been referred to [place] by Anthony Councilman, PA-C for evaluation and management of leukopenia. The pt reports that he is doing well overall.  Lab results 04/29/2021 of CBC and CMP is as follows: all values are WNL except for WBC of 2.3K (differential not done), RDW of 11.2, Glucose of 117, Alkaline Phosphatase of 37.  Patient reports that he has recently had labs from [place] this month in the last couple of weeks and her his WBC count was up to 3.2k with normal hemoglobin and platelet count.  The pt reports no issues with frequent infections.  No new bone pains.  No fevers no chills no night sweats.  No new lumps or bumps.. Notes that he had COVID in January 2022 and is still having some issues with fatigue brain fog and a sense of malaise.  He does not report any fixed physical limitations and is still working as a Geophysical data processor for a [employer].  He is accompanied by his wife who notes that he has been having significant anxieties in general and that seems to be keeping him from doing physical activities.  He reports that he was diagnosed with atrial fibrillation and has been on flecainide and anticoagulation for this for 3-4 years.  Patient denies using any drugs No unusual over-the-counter supplements . He has been having apparent issues with low glucose levels which is being evaluated by his primary care physician.  On review of systems, pt reports significant issues with anxiety which we discussed about addressing.  INTERVAL HISTORY  Anthony Henderson is here for follow-up regarding his leukopenia.  He notes he has been doing well no fevers no chills no night sweats.  No new lumps or bumps.   Energy levels have improved since his last visit.  He feels more energetic and feels less depressed overall.  He also notes less anxiety. Labs done on 08/27/2021 were reviewed with the patient. MEDICAL HISTORY:  Past Medical History:  Diagnosis Date   Abdominal pain    Anxiety    "related to SVT"   Atrial fibrillation ([place])    following RFCA of SVT 03/2014 - on Flecainide   COVID 12/2020   Depression    GERD (gastroesophageal reflux disease)    Headache    Hiatal hernia    High risk sexual behavior    Other malaise and fatigue    Panic attack    Post-operative nausea and vomiting    SVT (supraventricular tachycardia) (HCC)    s/p ablation 03-10-14 by Dr Anthony Henderson  Rhabdomyolysis  SURGICAL HISTORY: Past Surgical History:  Procedure Laterality Date   ABLATION  03/10/2014   RFCA of left posterior septal accessory pathyway by Dr Anthony Henderson with resultant atrial fibrillation   SUPRAVENTRICULAR TACHYCARDIA ABLATION N/A 03/10/2014   Procedure: SUPRAVENTRICULAR TACHYCARDIA ABLATION;  Surgeon: Evans Lance, MD;  Location: [facility] CATH LAB;  Service: Cardiovascular;  Laterality: N/A;    SOCIAL HISTORY: Social History   Socioeconomic History   Marital status: Married    Spouse name: Anthony Henderson   Number of children: 3   Years of education: Not on file   Highest education level: High school graduate  Occupational History   Occupation: Time Environmental consultant  Tobacco  Use   Smoking status: Former    Packs/day: 0.25    Years: 10.00    Pack years: 2.50    Types: Cigarettes    Quit date: 02/03/2014    Years since quitting: 7.6   Smokeless tobacco: Never  Vaping Use   Vaping Use: Never used  Substance and Sexual Activity   Alcohol use: Not Currently    Alcohol/week: 0.0 standard drinks    Comment: 03/10/2014 "might have a few drinks 1-2 times/yr, if that"   Drug use: No   Sexual activity: Yes  Other Topics Concern   Not on file  Social History Narrative   Lives with wife   No caffeine    Social Determinants of Health   Financial Resource Strain: Not on file  Food Insecurity: Not on file  Transportation Needs: Not on file  Physical Activity: Not on file  Stress: Not on file  Social Connections: Not on file  Intimate Partner Violence: Not on file    FAMILY HISTORY: Family History  Problem Relation Age of Onset   Healthy Mother    Healthy Father    Diabetes Maternal Grandmother    Colon cancer Neg Hx    Esophageal cancer Neg Hx    Rectal cancer Neg Hx    Stomach cancer Neg Hx      ALLERGIES:  is allergic to penicillins.  MEDICATIONS:  Current Outpatient Medications  Medication Sig Dispense Refill   blood glucose meter kit and supplies KIT Dispense based on patient and insurance preference. Use up to four times daily as directed. (FOR ICD-9 250.00, 250.01). 1 each 0   flecainide (TAMBOCOR) 100 MG tablet Take 0.5 tablets (50 mg total) by mouth daily. (Patient taking differently: Take 50 mg by mouth every other day.) 45 tablet 0   naproxen (NAPROSYN) 500 MG tablet Take 1 tablet (500 mg total) by mouth 2 (two) times daily. 30 tablet 0   ondansetron (ZOFRAN) 4 MG tablet Take 1 tablet (4 mg total) by mouth every 6 (six) hours as needed for nausea or vomiting. 12 tablet 0   predniSONE (DELTASONE) 10 MG tablet Take 4 tablets (40 mg total) by mouth daily. 20 tablet 0   No current facility-administered medications for this visit.    REVIEW OF SYSTEMS:   .10 Point review of Systems was done is negative except as noted above.   PHYSICAL EXAMINATION: ECOG PERFORMANCE STATUS: 1 - Symptomatic but completely ambulatory  . Vitals:   09/01/21 1514  BP: 120/81  Pulse: 67  Resp: 17  Temp: 98.4 F (36.9 C)  SpO2: 100%   Filed Weights   09/01/21 1514  Weight: 140 lb 9.6 oz (63.8 kg)   .Body mass index is 19.07 kg/m. Marland Kitchen GENERAL:alert, in no acute distress and comfortable SKIN: no acute rashes, no significant lesions EYES: conjunctiva are pink and  non-injected, sclera anicteric OROPHARYNX: MMM, no exudates, no oropharyngeal erythema or ulceration NECK: supple, no JVD LYMPH:  no palpable lymphadenopathy in the cervical, axillary or inguinal regions LUNGS: clear to auscultation b/l with normal respiratory effort HEART: regular rate & rhythm ABDOMEN:  normoactive bowel sounds , non tender, not distended. Extremity: no pedal edema PSYCH: alert & oriented x 3 with fluent speech NEURO: no focal motor/sensory deficits   LABORATORY DATA:  I have reviewed the data as listed  . CBC Latest Ref Rng & Units 08/27/2021 08/27/2021 04/29/2021  WBC 4.0 - 10.5 K/uL 3.6(L) - 2.3(L)  Hemoglobin 13.0 - 17.0 g/dL 14.9 -  15.4  Hematocrit 39.0 - 52.0 % 44.1 45.8 45.6  Platelets 150 - 400 K/uL 255 - 287    . CMP Latest Ref Rng & Units 08/27/2021 04/29/2021 05/06/2020  Glucose 70 - 99 mg/dL 140(H) 117(H) 110(H)  BUN 6 - 20 mg/dL '16 11 14  ' Creatinine 0.61 - 1.24 mg/dL 1.26(H) 1.07 1.03  Sodium 135 - 145 mmol/L 139 136 139  Potassium 3.5 - 5.1 mmol/L 4.0 3.9 3.5  Chloride 98 - 111 mmol/L 102 101 100  CO2 22 - 32 mmol/L '28 27 26  ' Calcium 8.9 - 10.3 mg/dL 9.5 9.4 9.8  Total Protein 6.5 - 8.1 g/dL 7.2 7.3 -  Total Bilirubin 0.3 - 1.2 mg/dL 0.6 0.7 -  Alkaline Phos 38 - 126 U/L 57 37(L) -  AST 15 - 41 U/L 18 22 -  ALT 0 - 44 U/L 14 16 -   Component     Latest Ref Rng & Units 08/27/2021  IgG (Immunoglobin G), Serum     603 - 1,613 mg/dL 1,182  IgA     90 - 386 mg/dL 180  IgM (Immunoglobulin M), Srm     20 - 172 mg/dL 63  Total Protein ELP     6.0 - 8.5 g/dL 6.6  Albumin SerPl Elph-Mcnc     2.9 - 4.4 g/dL 4.1  Alpha 1     0.0 - 0.4 g/dL 0.2  Alpha2 Glob SerPl Elph-Mcnc     0.4 - 1.0 g/dL 0.5  B-Globulin SerPl Elph-Mcnc     0.7 - 1.3 g/dL 0.8  Gamma Glob SerPl Elph-Mcnc     0.4 - 1.8 g/dL 1.1  M Protein SerPl Elph-Mcnc     Not Observed g/dL Not Observed  Globulin, Total     2.2 - 3.9 g/dL 2.5  Albumin/Glob SerPl     0.7 - 1.7 1.7  IFE  1      Comment  Please Note (HCV):      Comment  Folate, Hemolysate     Not Estab. ng/mL 497.0  HCT     37.5 - 51.0 % 45.8  Folate, RBC     >498 ng/mL 1,085  Vitamin B12     180 - 914 pg/mL 210  HIV Screen 4th Generation wRfx     Non Reactive Non Reactive  HCV Ab     NON REACTIVE NON REACTIVE  Sed Rate     0 - 16 mm/hr 1  LDH     98 - 192 U/L 166    RADIOGRAPHIC STUDIES: I have personally reviewed the radiological images as listed and agreed with the findings in the report. No results found.  ASSESSMENT & PLAN:   40 year old male with history of atrial fibrillation, SVT referred for  1) Isolated leukopenia No anemia no thrombocytopenia His WBC count was 2.3k no differential available.  Recent labs at [facility] couple of weeks ago showed his white counts are improved to 3.2k  PLAN: -Patient notes he is doing clinically much better and has no acute new symptoms.  No fevers no chills no night sweats no lumps or bumps. -Labs show resolved leukopenia and his CBC is within normal limits CMP within normal limits as well Sedimentation rate normal HIV and hepatitis C testing negative B12 levels low normal at 210.  Patient was recommended to start taking white B12 1000 mcg p.o. daily and follow-up with primary care physician. LDH within normal limits at 166 Myeloma panel within normal limits.  -All the lab  results with the patient no additional hematologic work-up recommended at this time continue follow-up with primary care physician.  FOLLOW UP: Follow-up with primary care physician   All of the patients questions were answered with apparent satisfaction. The patient knows to call the clinic with any problems, questions or concerns. . The total time spent in the appointment was 20 minutes and more than 50% was on counseling and direct patient cares.    Sullivan Lone MD [place] AAHIVMS [facility] Hematology/Oncology Physician [facility]

## 2022-01-22 ENCOUNTER — Emergency Department (HOSPITAL_COMMUNITY)
Admission: EM | Admit: 2022-01-22 | Discharge: 2022-01-22 | Disposition: A | Discharge status: Home / Self Care | Payer: PRIVATE HEALTH INSURANCE | Attending: Emergency Medicine | Admitting: Emergency Medicine

## 2022-01-22 ENCOUNTER — Encounter (HOSPITAL_COMMUNITY): Payer: Self-pay

## 2022-01-22 DIAGNOSIS — R3589 Other polyuria: Secondary | ICD-10-CM | POA: Insufficient documentation

## 2022-01-22 DIAGNOSIS — R739 Hyperglycemia, unspecified: Secondary | ICD-10-CM | POA: Insufficient documentation

## 2022-01-22 DIAGNOSIS — I10 Essential (primary) hypertension: Secondary | ICD-10-CM | POA: Insufficient documentation

## 2022-01-22 DIAGNOSIS — F419 Anxiety disorder, unspecified: Secondary | ICD-10-CM | POA: Insufficient documentation

## 2022-01-22 DIAGNOSIS — R531 Weakness: Secondary | ICD-10-CM

## 2022-01-22 DIAGNOSIS — D72819 Decreased white blood cell count, unspecified: Secondary | ICD-10-CM | POA: Insufficient documentation

## 2022-01-22 LAB — DIFFERENTIAL
Abs Immature Granulocytes: 0 10*3/uL (ref 0.00–0.07)
Basophils Absolute: 0 10*3/uL (ref 0.0–0.1)
Basophils Relative: 1 %
Eosinophils Absolute: 0.1 10*3/uL (ref 0.0–0.5)
Eosinophils Relative: 2 %
Immature Granulocytes: 0 %
Lymphocytes Relative: 53 %
Lymphs Abs: 1.8 10*3/uL (ref 0.7–4.0)
Monocytes Absolute: 0.3 10*3/uL (ref 0.1–1.0)
Monocytes Relative: 10 %
Neutro Abs: 1.1 10*3/uL — ABNORMAL LOW (ref 1.7–7.7)
Neutrophils Relative %: 34 %

## 2022-01-22 LAB — CBG MONITORING, ED
Glucose-Capillary: 112 mg/dL — ABNORMAL HIGH (ref 70–99)
Glucose-Capillary: 125 mg/dL — ABNORMAL HIGH (ref 70–99)

## 2022-01-22 LAB — URINALYSIS, ROUTINE W REFLEX MICROSCOPIC
Bilirubin Urine: NEGATIVE
Glucose, UA: NEGATIVE mg/dL
Hgb urine dipstick: NEGATIVE
Ketones, ur: NEGATIVE mg/dL
Leukocytes,Ua: NEGATIVE
Nitrite: NEGATIVE
Protein, ur: NEGATIVE mg/dL
Specific Gravity, Urine: 1.019 (ref 1.005–1.030)
pH: 6 (ref 5.0–8.0)

## 2022-01-22 LAB — BASIC METABOLIC PANEL
Anion gap: 10 (ref 5–15)
BUN: 12 mg/dL (ref 6–20)
CO2: 27 mmol/L (ref 22–32)
Calcium: 9.6 mg/dL (ref 8.9–10.3)
Chloride: 98 mmol/L (ref 98–111)
Creatinine, Ser: 0.98 mg/dL (ref 0.61–1.24)
GFR, Estimated: >60 mL/min (ref >60)
Glucose, Bld: 121 mg/dL — ABNORMAL HIGH (ref 70–99)
Potassium: 4.1 mmol/L (ref 3.5–5.1)
Sodium: 135 mmol/L (ref 135–145)

## 2022-01-22 LAB — CBC
HCT: 45.8 % (ref 39.0–52.0)
Hemoglobin: 15.5 g/dL (ref 13.0–17.0)
MCH: 30.9 pg (ref 26.0–34.0)
MCHC: 33.8 g/dL (ref 30.0–36.0)
MCV: 91.2 fL (ref 80.0–100.0)
Platelets: 300 10*3/uL (ref 150–400)
RBC: 5.02 MIL/uL (ref 4.22–5.81)
RDW: 11.3 % — ABNORMAL LOW (ref 11.5–15.5)
WBC: 3.3 10*3/uL — ABNORMAL LOW (ref 4.0–10.5)
nRBC: 0 % (ref 0.0–0.2)

## 2022-01-22 MED ORDER — LORAZEPAM 2 MG/ML IJ SOLN
0.5000 mg | Freq: Once | INTRAMUSCULAR | Status: DC
  Filled 2022-01-22: qty 1

## 2022-01-22 MED ORDER — SODIUM CHLORIDE 0.9 % IV BOLUS: 1000.0000 mL | Freq: Once | INTRAVENOUS | Status: DC

## 2022-01-22 NOTE — ED Triage Notes (Signed)
Pt arrives POV for eval of multiple complaints including hypoglycemia, headache, polyuria. Pt reports he is a "borderline diabetic", does not take any medications for diabetes management . Pt reports that he also has been having a HA w/ some intermittent L sided facial numbness. Pt reports frequent urination, denies dysuria.

## 2022-01-22 NOTE — ED Provider Notes (Signed)
[facility] Provider Note   CSN: [id number] Arrival date & time: 01/22/22  1722     History  Chief Complaint  Patient presents with   Hypoglycemia    Anthony Henderson is a 41 y.o. male.   Hypoglycemia  Patient with history of hypertension, l prediabetes, eukopenia, gang Li glioma of brain, prior history of A-fib, no longer anticoagulated after ablation presents today due to complaint of weakness.  States she has been feeling lightheaded intermittently for a week.  Also endorses polyuria and headache, denies any penile discharge, dysuria, hematuria.  States has been having intermittent left facial tingling and numbness.  This last for seconds, comes and goes that any obvious triggers.  Does not extend into the extremities, no associated confusion or difficulty speaking or with gait.     Home Medications Prior to Admission medications   Medication Sig Start Date End Date Taking? Authorizing Provider  blood glucose meter kit and supplies KIT Dispense based on patient and insurance preference. Use up to four times daily as directed. (FOR ICD-9 250.00, 250.01). 08/01/19   Nita Sells, MD  flecainide (TAMBOCOR) 100 MG tablet Take 0.5 tablets (50 mg total) by mouth daily. Patient taking differently: Take 50 mg by mouth every other day. 08/24/20   Evans Lance, MD  naproxen (NAPROSYN) 500 MG tablet Take 1 tablet (500 mg total) by mouth 2 (two) times daily. 07/24/21   White, Leitha Schuller, NP  ondansetron (ZOFRAN) 4 MG tablet Take 1 tablet (4 mg total) by mouth every 6 (six) hours as needed for nausea or vomiting. 12/23/20   Sharion Balloon, NP  predniSONE (DELTASONE) 10 MG tablet Take 4 tablets (40 mg total) by mouth daily. 07/24/21   Hans Eden, NP      Allergies    Penicillins    Review of Systems   Review of Systems  Constitutional:  Positive for fatigue.   Physical Exam Updated Vital Signs BP 122/88    Pulse 65    Temp 98.2 F  (36.8 C) (Oral)    Resp 16    Ht 6' (1.829 m)    Wt 64 kg    SpO2 98%    BMI 19.14 kg/m  Physical Exam Vitals and nursing note reviewed. Exam conducted with a chaperone present.  Constitutional:      Appearance: Normal appearance.  HENT:     Head: Normocephalic and atraumatic.  Eyes:     General: No scleral icterus.       Right eye: No discharge.        Left eye: No discharge.     Extraocular Movements: Extraocular movements intact.     Pupils: Pupils are equal, round, and reactive to light.  Cardiovascular:     Rate and Rhythm: Normal rate and regular rhythm.     Pulses: Normal pulses.     Heart sounds: Normal heart sounds. No murmur heard.   No friction rub. No gallop.  Pulmonary:     Effort: Pulmonary effort is normal. No respiratory distress.     Breath sounds: Normal breath sounds.  Abdominal:     General: Abdomen is flat. Bowel sounds are normal. There is no distension.     Palpations: Abdomen is soft.     Tenderness: There is no abdominal tenderness.  Skin:    General: Skin is warm and dry.     Coloration: Skin is not jaundiced.  Neurological:     General: No  focal deficit present.     Mental Status: He is alert. Mental status is at baseline.     Cranial Nerves: No cranial nerve deficit.     Coordination: Coordination normal.  Psychiatric:        Mood and Affect: Mood is anxious.    ED Results / Procedures / Treatments   Labs (all labs ordered are listed, but only abnormal results are displayed) Labs Reviewed  CBC - Abnormal; Notable for the following components:      Result Value   WBC 3.3 (*)    RDW 11.3 (*)    All other components within normal limits  BASIC METABOLIC PANEL - Abnormal; Notable for the following components:   Glucose, Bld 121 (*)    All other components within normal limits  DIFFERENTIAL - Abnormal; Notable for the following components:   Neutro Abs 1.1 (*)    All other components within normal limits  CBG MONITORING, ED - Abnormal;  Notable for the following components:   Glucose-Capillary 112 (*)    All other components within normal limits  CBG MONITORING, ED - Abnormal; Notable for the following components:   Glucose-Capillary 125 (*)    All other components within normal limits  URINE CULTURE  URINALYSIS, ROUTINE W REFLEX MICROSCOPIC  DIFFERENTIAL    EKG EKG Interpretation  Date/Time:  Saturday January 22 2022 19:38:17 EST Ventricular Rate:  69 PR Interval:  171 QRS Duration: 84 QT Interval:  383 QTC Calculation: 411 R Axis:   78 Text Interpretation: Sinus rhythm Confirmed by Godfrey Pick [id number] on 01/22/2022 7:56:09 PM  Radiology No results found.  Procedures Procedures    Medications Ordered in ED Medications - No data to display   ED Course/ Medical Decision Making/ A&P                           Medical Decision Making Amount and/or Complexity of Data Reviewed Labs: ordered.  Risk Prescription drug management.   This patient presents to the ED for concern of polyuria, left-sided facial numbness,, this involves an extensive number of treatment options, and is a complaint that carries with it a high risk of complications and morbidity.  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  No pronator drift, no dysarthria.  There is no left-sided facial droop, patient is symptom-free.  Lungs are clear to auscultation bilaterally, regular rhythm.  Physical exam overall unremarkable.  Additional history obtained: -Patient is followed by neurology, most  recent MRI was in September which did not show any change to the known and followed ganglioglioma.  Patient also has an leukopenia and is being followed by oncology, unclear etiology.  -I reviewed the office visit with oncology from 9/28 which noted patient has been having intermittent brain fog and fatigue since having COVID in JAN 2022.  MR brain did not show any changes to the ganglioglioma 08/2021.   Lab Tests: CBC, BMP, UA,  CBG -I ordered, reviewed, and interpreted labs.  The pertinent results include: Slight hyperglycemia at 121 but not because of DKA.  There is no gross electrolyte derangement or underlying anemia.  Patient is leukopenic at 3.3, this is roughly at baseline compared to previous at 3.6.  UA without any underlying signs of a UTI, culture pending.  Imaging: Although I did consider proceeding with neuro imaging, patient's symptoms are not consistent with acute stroke or TIA.  There is no symptoms on exam, no unilateral lysing weakness, speech deficit and the duration is seconds.   EKG  -Sinus rhythm without any heart block, bradycardia, arrhythmia such as A-fib or SVT or ischemic findings.  ED Workup: Patient is not in DKA, additionally glucose is not high enough to warrant starting the patient on metformin for diabetes.  There is no electrolyte derangement, leukopenia is within normal limits and he is followed outpatient.   I considered CVA/stroke, however the symptoms are not consistent with this given there is no lateralization, no neurodeficits. Additionally, patient is followed by neurology and has had MRI in the last year.  No evidence of UTI, urine culture is pending.   Reevaluation: Patient resting comfortably, no recurrence of symptoms.  Mentating well with no focal deficits on reexamination.    Cardiac Monitoring: The patient was maintained on a cardiac monitor.  I personally viewed and interpreted the cardiac monitored which showed an underlying rhythm of:  NSR   Disposition: Although I did briefly consider admission for observation, but ultimately I feel patient is appropriate and stable for discharge home and outpatient follow-up.  Discussed with patient, he agrees with plan.          Final Clinical Impression(s) / ED Diagnoses Final diagnoses:  Weakness    Rx / DC Orders ED Discharge Orders     None         Sherrill Raring, Hershal Coria 01/22/22 2339    Godfrey Pick, MD 01/23/22 2249

## 2022-01-22 NOTE — Discharge Instructions (Addendum)
Your work-up today was reassuring.  Urine did not show any evidence of urinary tract infection, culture will take 3 days to grow back and if it grows bacteria you will receive a phone call in a prescription for antibiotics.  It is possible this could be long COVID symptoms, also could be related to a normal neurologic process.  I do think you should follow-up with your primary in the next few days, reach out to neurology and see if they have any recommendations.  Keep a journal of symptoms, anything that makes things better or worse as well as the timeline.  Return to the ED if things worsen

## 2022-01-23 LAB — URINE CULTURE: Culture: NO GROWTH

## 2022-02-01 ENCOUNTER — Encounter: Payer: Self-pay | Admitting: Diagnostic Neuroimaging

## 2022-02-01 ENCOUNTER — Ambulatory Visit (INDEPENDENT_AMBULATORY_CARE_PROVIDER_SITE_OTHER): Payer: No Typology Code available for payment source | Admitting: Diagnostic Neuroimaging

## 2022-02-01 VITALS — BP 115/77 | HR 75 | Ht 72.0 in | Wt 135.8 lb

## 2022-02-01 DIAGNOSIS — R519 Headache, unspecified: Secondary | ICD-10-CM

## 2022-02-01 DIAGNOSIS — G8929 Other chronic pain: Secondary | ICD-10-CM

## 2022-02-01 DIAGNOSIS — U099 Post covid-19 condition, unspecified: Secondary | ICD-10-CM | POA: Diagnosis not present

## 2022-02-01 DIAGNOSIS — G930 Cerebral cysts: Secondary | ICD-10-CM

## 2022-02-01 NOTE — Progress Notes (Signed)
[facility]  PATIENT: Anthony Henderson DOB: 1980-12-31  REFERRING CLINICIAN: Chaney Malling, PA HISTORY FROM: patient  REASON FOR VISIT: follow up   HISTORICAL  CHIEF COMPLAINT:  Chief Complaint  Patient presents with   Headache    Rm 6, one year FU "still get brain fog, feel like I may pass out, began taking B12 and D3"    HISTORY OF PRESENT ILLNESS:   UPDATE (02/01/22, VRP): Since last visit, doing well. Symptoms are slightly improved. Mild intermittent lightheadedness, dizziness, brain fog. No other alleviating or aggravating factors.  PRIOR HPI (12/30/20): 41 year old male here for evaluation of headaches and dizziness.  History of SVT, anxiety.  June 2021 patient was working in his shed when he came out and accidentally hit his head on the door frame.  He had immediate sharp pain over the top of his head.  The next day he had increasing headache and pain.  Patient went to the emergency room for evaluation and had CT of the head which showed a right temporal lobe cyst with mild edema.  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  He followed up with neurosurgery, had follow-up MRI in September 2021 which showed stability of lesion.  This was felt to be an incidental finding.  Patient's headache and pain from his concussion in June 2021 resolved within 1 to 2 weeks.  Patient has done well throughout 2021.  January 2022 patient had headache and fever, was diagnosed with Covid infection.  Patient had been vaccinated prior to this.  He had ongoing headaches, brain fog and dizziness following this.  He has been taking Tylenol Aleve as needed with mild benefit.  In the last few days symptoms have slightly improved.  Yesterday was a fairly good day and he was able to return to work.  Today he does have some ongoing throbbing headache sensation.  No nausea or vomiting or sensitive light or  sound.   REVIEW OF SYSTEMS: Full 14 system review of systems performed and negative with exception of: As per HPI.  ALLERGIES: Allergies  Allergen Reactions   Penicillins Hives    Did it involve swelling of the face/tongue/throat, SOB, or low BP? No Did it involve sudden or severe rash/hives, skin peeling, or any reaction on the inside of your mouth or nose? Yes Did you need to seek medical attention at a hospital or doctor's office? No When did it last happen?      childhood If all above answers are NO, may proceed with cephalosporin use.    HOME MEDICATIONS: Outpatient Medications Prior to Visit  Medication Sig Dispense Refill   blood glucose meter kit and supplies KIT Dispense based on patient and insurance preference. Use up to four times daily as directed. (FOR ICD-9 250.00, 250.01). 1 each 0   Cholecalciferol (D3 ADULT PO) Take by mouth daily.     Continuous Blood Gluc Sensor (FREESTYLE LIBRE 2 SENSOR) MISC See admin instructions.     Cyanocobalamin (B-12 PO) Take by mouth daily.     flecainide (TAMBOCOR) 100 MG tablet Take 0.5 tablets (50 mg total) by mouth daily. (Patient taking differently: Take 50 mg by mouth every other day.) 45 tablet 0   Multiple Vitamin (MULTIVITAMIN) tablet Take 1 tablet by mouth daily.     naproxen (NAPROSYN) 500 MG tablet Take 1 tablet (500 mg total) by mouth 2 (two) times daily. 30 tablet 0   ondansetron (ZOFRAN) 4  MG tablet Take 1 tablet (4 mg total) by mouth every 6 (six) hours as needed for nausea or vomiting. 12 tablet 0   predniSONE (DELTASONE) 10 MG tablet Take 4 tablets (40 mg total) by mouth daily. 20 tablet 0   No facility-administered medications prior to visit.    PAST MEDICAL HISTORY: Past Medical History:  Diagnosis Date   Abdominal pain    Anxiety    "related to SVT"   Atrial fibrillation ([place])    following RFCA of SVT 03/2014 - on Flecainide   COVID 12/2020   Depression    GERD (gastroesophageal reflux disease)    Headache     Hiatal hernia    High risk sexual behavior    Other malaise and fatigue    Panic attack    Post-operative nausea and vomiting    SVT (supraventricular tachycardia) (HCC)    s/p ablation 03-10-14 by Dr Lovena Le    PAST SURGICAL HISTORY: Past Surgical History:  Procedure Laterality Date   ABLATION  03/10/2014   RFCA of left posterior septal accessory pathyway by Dr Lovena Le with resultant atrial fibrillation   SUPRAVENTRICULAR TACHYCARDIA ABLATION N/A 03/10/2014   Procedure: SUPRAVENTRICULAR TACHYCARDIA ABLATION;  Surgeon: Evans Lance, MD;  Location: [facility] CATH LAB;  Service: Cardiovascular;  Laterality: N/A;    FAMILY HISTORY: Family History  Problem Relation Age of Onset   Healthy Mother    Healthy Father    Diabetes Maternal Grandmother    Colon cancer Neg Hx    Esophageal cancer Neg Hx    Rectal cancer Neg Hx    Stomach cancer Neg Hx     SOCIAL HISTORY: Social History   Socioeconomic History   Marital status: Married    Spouse name: Colletta [place]   Number of children: 3   Years of education: Not on file   Highest education level: High school graduate  Occupational History   Occupation: Time Environmental consultant  Tobacco Use   Smoking status: Former    Packs/day: 0.25    Years: 10.00    Pack years: 2.50    Types: Cigarettes    Quit date: 02/03/2014    Years since quitting: 8.0   Smokeless tobacco: Never  Vaping Use   Vaping Use: Never used  Substance and Sexual Activity   Alcohol use: Not Currently    Alcohol/week: 0.0 standard drinks    Comment: 03/10/2014 "might have a few drinks 1-2 times/yr, if that"   Drug use: No   Sexual activity: Yes  Other Topics Concern   Not on file  Social History Narrative   Lives with wife   No caffeine   Social Determinants of Health   Financial Resource Strain: Not on file  Food Insecurity: Not on file  Transportation Needs: Not on file  Physical Activity: Not on file  Stress: Not on file  Social Connections: Not on file   Intimate Partner Violence: Not on file     PHYSICAL EXAM  GENERAL EXAM/CONSTITUTIONAL: Vitals:  Vitals:   02/01/22 0856  BP: 115/77  Pulse: 75  Weight: 135 lb 12.8 oz (61.6 kg)  Height: 6' (1.829 m)   Body mass index is 18.42 kg/m. Wt Readings from Last 3 Encounters:  02/01/22 135 lb 12.8 oz (61.6 kg)  01/22/22 141 lb 1.5 oz (64 kg)  09/01/21 140 lb 9.6 oz (63.8 kg)   Patient is in no distress; well developed, nourished and groomed; neck is supple  CARDIOVASCULAR: Examination of carotid arteries is normal;  no carotid bruits Regular rate and rhythm, no murmurs Examination of peripheral vascular system by observation and palpation is normal  EYES: Ophthalmoscopic exam of optic discs and posterior segments is normal; no papilledema or hemorrhages No results found.  MUSCULOSKELETAL: Gait, strength, tone, movements noted in Neurologic exam below  NEUROLOGIC: MENTAL STATUS:  No flowsheet data found. awake, alert, oriented to person, place and time recent and remote memory intact normal attention and concentration language fluent, comprehension intact, naming intact fund of knowledge appropriate  CRANIAL NERVE:  2nd - no papilledema on fundoscopic exam 2nd, 3rd, 4th, 6th - pupils equal and reactive to light, visual fields full to confrontation, extraocular muscles intact, no nystagmus 5th - facial sensation symmetric 7th - facial strength symmetric 8th - hearing intact 9th - palate elevates symmetrically, uvula midline 11th - shoulder shrug symmetric 12th - tongue protrusion midline  MOTOR:  normal bulk and tone, full strength in the BUE, BLE  SENSORY:  normal and symmetric to light touch, temperature, vibration  COORDINATION:  finger-nose-finger, fine finger movements normal  REFLEXES:  deep tendon reflexes present and symmetric  GAIT/STATION:  narrow based gait    DIAGNOSTIC DATA (LABS, IMAGING, TESTING) - I reviewed patient records, labs, notes,  testing and imaging myself where available.  Lab Results  Component Value Date   WBC 3.3 (L) 01/22/2022   HGB 15.5 01/22/2022   HCT 45.8 01/22/2022   MCV 91.2 01/22/2022   PLT 300 01/22/2022      Component Value Date/Time   NA 135 01/22/2022 1754   K 4.1 01/22/2022 1754   CL 98 01/22/2022 1754   CO2 27 01/22/2022 1754   GLUCOSE 121 (H) 01/22/2022 1754   BUN 12 01/22/2022 1754   CREATININE 0.98 01/22/2022 1754   CREATININE 1.26 (H) 08/27/2021 1536   CREATININE 1.06 05/02/2013 1219   CALCIUM 9.6 01/22/2022 1754   PROT 7.2 08/27/2021 1536   ALBUMIN 4.3 08/27/2021 1536   AST 18 08/27/2021 1536   ALT 14 08/27/2021 1536   ALKPHOS 57 08/27/2021 1536   BILITOT 0.6 08/27/2021 1536   GFRNONAA >60 01/22/2022 1754   GFRNONAA >60 08/27/2021 1536   GFRAA >60 05/06/2020 2301   No results found for: CHOL, HDL, LDLCALC, LDLDIRECT, TRIG, CHOLHDL Lab Results  Component Value Date   HGBA1C 6.1 (H) 07/31/2019   Lab Results  Component Value Date   VITAMINB12 210 08/27/2021   Lab Results  Component Value Date   TSH 1.525 02/12/2014    05/06/2020 MRI brain with and without [I reviewed images myself and agree with interpretation. -VRP]  1. 2.9 x 2.4 x 1.7 cm cystic mass centered at the mid right temporal lobe with mild surrounding vasogenic edema. Finding concerning for a primary CNS neoplasm. Primary differential considerations include astrocytoma versus ganglioglioma. Neuro surgical consultation recommended. 2. 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 3. Otherwise normal brain MRI.  08/06/20 MRI brain with and without [I reviewed images myself and agree with interpretation. -VRP]  1. 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 2. No acute intracranial abnormality or significant interval change.  07/26/21 MRI brain 1. Nonprogressive right  temporal lobe mass. Ganglioglioma remains the leading imaging consideration. 2. 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      ASSESSMENT AND PLAN  41 y.o. year old male here with new onset headaches, dizziness brain fog in January 2022 following Covid infection.  Also with history of concussion in June 2021.  Also with incidental right temporal lobe lesion found during concussion evaluation in June 2021.  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 Cyst of brain     PLAN:  POST-COVID SYMPTOMS (lightheadedness, brain fog, dizziness since COVID in Jan 2022) - continue supportive care; optimize nutrition, vitamin supplements, exercise, sleep and stress mgmt  RIGHT TEMPORAL LOBE LESION / CYST (? 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 Christella Noa  Return for return to PCP.    Penni Bombard, MD 7/84/1282, 0:81 AM Certified in Neurology, Neurophysiology and Neuroimaging  [facility] [address], [place],  [address] [phone]

## 2022-02-09 ENCOUNTER — Ambulatory Visit: Payer: Self-pay | Admitting: Diagnostic Neuroimaging

## 2022-02-24 ENCOUNTER — Encounter: Payer: Self-pay | Admitting: Student

## 2022-02-24 ENCOUNTER — Other Ambulatory Visit: Payer: Self-pay

## 2022-02-24 ENCOUNTER — Ambulatory Visit: Payer: PRIVATE HEALTH INSURANCE | Admitting: Student

## 2022-02-24 VITALS — BP 116/85 | HR 74 | Temp 98.2°F | Resp 24 | Ht 72.0 in | Wt 135.2 lb

## 2022-02-24 DIAGNOSIS — Z13220 Encounter for screening for lipoid disorders: Secondary | ICD-10-CM

## 2022-02-24 DIAGNOSIS — D72819 Decreased white blood cell count, unspecified: Secondary | ICD-10-CM

## 2022-02-24 DIAGNOSIS — I471 Supraventricular tachycardia, unspecified: Secondary | ICD-10-CM

## 2022-02-24 DIAGNOSIS — E119 Type 2 diabetes mellitus without complications: Secondary | ICD-10-CM

## 2022-02-24 DIAGNOSIS — R3589 Other polyuria: Secondary | ICD-10-CM | POA: Diagnosis not present

## 2022-02-24 DIAGNOSIS — R7303 Prediabetes: Secondary | ICD-10-CM | POA: Diagnosis not present

## 2022-02-24 DIAGNOSIS — D432 Neoplasm of uncertain behavior of brain, unspecified: Secondary | ICD-10-CM

## 2022-02-24 DIAGNOSIS — Z7689 Persons encountering health services in other specified circumstances: Secondary | ICD-10-CM

## 2022-02-24 DIAGNOSIS — E1169 Type 2 diabetes mellitus with other specified complication: Secondary | ICD-10-CM

## 2022-02-24 NOTE — Patient Instructions (Signed)
Anthony Henderson, it was a pleasure seeing you today! ? ?Today we discussed: ?- I am going to get lab work for you today. I will call you with the results. ? ?I have ordered the following labs today: ? ? ?Lab Orders    ?     Lipid Profile    ?     Hemoglobin A1c    ?     CBC with Diff    ?     Microalbumin / Creatinine Urine Ratio     ? ?Follow-up: 6 months  ? ?Please make sure to arrive 15 minutes prior to your next appointment. If you arrive late, you may be asked to reschedule.  ? ?We look forward to seeing you next time. Please call our clinic at [phone] if you have any questions or concerns. The best time to call is Monday-Friday from 9am-4pm, but there is someone available 24/7. If after hours or the weekend, call the main hospital number and ask for the Internal Medicine Resident On-Call. If you need medication refills, please notify your pharmacy one week in advance and they will send [place] a request. ? ?Thank you for letting us take part in your care. Wishing you the best! ? ?Thank you, ?Sanjuan Dame, MD ? ?

## 2022-02-25 ENCOUNTER — Telehealth: Payer: Self-pay

## 2022-02-25 ENCOUNTER — Encounter: Payer: Self-pay | Admitting: Student

## 2022-02-25 DIAGNOSIS — Z7689 Persons encountering health services in other specified circumstances: Secondary | ICD-10-CM | POA: Insufficient documentation

## 2022-02-25 DIAGNOSIS — D72819 Decreased white blood cell count, unspecified: Secondary | ICD-10-CM | POA: Insufficient documentation

## 2022-02-25 DIAGNOSIS — D432 Neoplasm of uncertain behavior of brain, unspecified: Secondary | ICD-10-CM | POA: Insufficient documentation

## 2022-02-25 DIAGNOSIS — R3589 Other polyuria: Secondary | ICD-10-CM | POA: Insufficient documentation

## 2022-02-25 LAB — MICROALBUMIN / CREATININE URINE RATIO
Creatinine, Urine: 150 mg/dL
Microalb/Creat Ratio: 4 mg/g creat (ref 0–29)
Microalbumin, Urine: 5.7 ug/mL

## 2022-02-25 NOTE — Progress Notes (Signed)
? ?CC: establish care ? ?HPI: ? ?AnthonyAnthony Henderson is a 41 y.o. person with medical history as below presenting to [facility] to establish care. ? ?Please see problem-based list for further details, assessments, and plans. ? ?Past Medical History:  ?Diagnosis Date  ? Chronic leukopenia   ? 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 Hiatal hernia   ? SVT (supraventricular tachycardia) [facility])   ? s/p ablation 03-10-14 by Dr Anthony Henderson  ? ?Review of Systems:  As per HPI ? ?Physical Exam: ? ?Vitals:  ? 02/24/22 0954  ?BP: 116/85  ?Pulse: 74  ?Resp: (!) 24  ?Temp: 98.2 ?F (36.8 ?C)  ?TempSrc: Oral  ?SpO2: 100%  ?Weight: 135 lb 3.2 oz (61.3 kg)  ?Height: 6' (1.829 m)  ? ?General: Resting comfortably in no acute distress ?HENT: Normocephalic, atraumatic. No cervical, auricular, or supraclavicular lymphadenopathy. ?CV: Regular rate, rhythm. No murmurs appreciated. Distal pulses 2+ bilaterally. ?Pulm: Normal respiratory effort on room air. Clear to ausculation bilaterally. ?GI: Abdomen soft, non-tender, non-distended. Normoactive bowel sounds. ?MSK: Normal bulk, tone. No pitting edema bilateral lower extremities. ?Skin: Warm, dry. No rashes or lesions ?Neuro: Awake, alert, conversing appropriately. ?Psych: Normal mood, affect, speech. ? ?Assessment & Plan:  ? ?Encounter to establish care ?Anthony Henderson is 41yo person presenting to clinic today to establish care. He was previously seen intermittently at [facility].  ? ?He endorses a history of paroxysmal SVT s/p ablation, pre-diabetes. He currently does not take any medications. ? ?Anthony Henderson lives here in [place] with his wife and children. He has four sons, all of which are teenagers currently. He feels home is a safe environment for him. He is able to independently complete IADL's and ADL's. He does not use assistive device for ambulation. He previously smoked cigarettes, 1/4 pack daily for 10 years. He quit in 2015. No alcohol use. Occasional marijuana use, no other recreational  drugs. ? ?He is up-to-date on Tdap vaccine. He also has had three COVID-19 vaccinations. He does not get the yearly influenza vaccination. He has also been screened for HIV and HCV in the past. We will obtain urine microalbumin/creatinine ratio today. ? ?Today he denies weight changes, fevers, chills, body aches, chest pain, dyspnea, nausea, vomiting, abdominal pain, constipation, diarrhea, paresthesias, numbness, vision changes, dysuria, hematuria, melena. He does note some intermittent night sweats that he describes as cold sweats. These have been chronic for him. ? ?Polyuria ?Anthony Henderson reports significant polyuria over the last month. He reports he does not drink very many fluids during the day. He states he has not experienced similar symptoms before. He denies hematuria, dysuria, color changes, incomplete bladder emptying, polydipsia.  ? ?Today we will plan to obtain repeat A1c. Can consider UA at next visit for further evaluation if symptoms persist. ? ?- Follow-up A1c ?- Consider UA at next visit if symptoms persist ? ?Prediabetes ?Patient is not currently on any diabetic medications. Last A1c 6.1% in 2020. He reports not being checked for this since that time. He does report polyuria but no polydipsia. Further mentions he feels like he has low blood sugars intermittently in the morning due to self-resolving cold sweats. We will obtain repeat A1c to evaluate.  ? ?- Repeat A1c today ? ?PSVT (paroxysmal supraventricular tachycardia) ([place]) ?Patient reports he has not seen Dr. Lovena Henderson since 2021. He denies chest pain, dyspnea, lightheadedness, syncopal episodes, palpitations. He no longer is on flecainide or anticoagulation. I have instructed him to follow-up with Dr. Lovena Henderson in the next couple  of months. ? ?- Follow-up with cardiology ? 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 Since this time, he has been following close with  neurosurgery. Last appointment 08/2021, I am unable to see these notes. Anthony Henderson reports he was told this is benign and they have just increased the interval of follow-up from 6 months to 1 year. He denies any focal symptoms. ? ?- Follow-up with neurosurgery in September ? ?Chronic leukopenia ?Patient has significant past history of chronic leukopenia, dating as far back as at least 2013. Work-up has included repeated CBC, HIV, hepatitis labs, peripheral smear, autoimmune work-up, vitamin B12, inflammatory markers. This has been largely negative. He does follow with oncology, last appointment 07/01/2021. At that time, possibilities included medication-induced (flecainide) vs benign ethnic neutropenia. ? ?In reviewing his past labs, patient has had chronic leukopenia but recently has new neutropenia in February w/ ANC 1120. We will plan to repeat CBC with diff today. He does not have any lymphadenopathy or significant consitutional symptoms. Pending CBC result might have patient return to oncology. ? ?- Repeat CBC w diff today ? ?Patient discussed with Dr. Dareen Henderson ? ?Sanjuan Dame, MD ?Internal Medicine PGY-2 ?Pager: [phone] ? ?

## 2022-02-25 NOTE — Telephone Encounter (Signed)
Dr. Collene Gobble to call patient and update on lab results.  ?

## 2022-02-25 NOTE — Assessment & Plan Note (Addendum)
Patient is not currently on any diabetic medications. Last A1c 6.1% in 2020. He reports not being checked for this since that time. He does report polyuria but no polydipsia. Further mentions he feels like he has low blood sugars intermittently in the morning due to self-resolving cold sweats. We will obtain repeat A1c to evaluate.  ? ?- Repeat A1c today ? ?ADDENDUM: ?A1c 7.1%. Discussed results with patient. He is not interested in metformin given he has had multiple family members with bad side effects. He also would like to avoid any medication that could cause hypoglycemia. We will initiate SGLT2i today, will avoid GLP-1 given his weight. ? ?- Start Jardiance '10mg'$  daily ?- Return to clinic in three months to re-assess ?

## 2022-02-25 NOTE — Telephone Encounter (Signed)
I called patient and left VM. I told him I would return the call on Monday. ?

## 2022-02-25 NOTE — Assessment & Plan Note (Signed)
Mr. Flett reports significant polyuria over the last month. He reports he does not drink very many fluids during the day. He states he has not experienced similar symptoms before. He denies hematuria, dysuria, color changes, incomplete bladder emptying, polydipsia.  ? ?Today we will plan to obtain repeat A1c. Can consider UA at next visit for further evaluation if symptoms persist. ? ?- Follow-up A1c ?- Consider UA at next visit if symptoms persist ?

## 2022-02-25 NOTE — Telephone Encounter (Signed)
Only CBC has resulted with Critical low WBC at 2.5. ?

## 2022-02-25 NOTE — Assessment & Plan Note (Addendum)
Patient has significant past history of chronic leukopenia, dating as far back as at least 2013. Work-up has included repeated CBC, HIV, hepatitis labs, peripheral smear, autoimmune work-up, vitamin B12, inflammatory markers. This has been largely negative. He does follow with oncology, last appointment 07/01/2021. At that time, possibilities included medication-induced (flecainide) vs benign ethnic neutropenia. ? ?In reviewing his past labs, patient has had chronic leukopenia but recently has new neutropenia in February w/ ANC 1120. We will plan to repeat CBC with diff today. He does not have any lymphadenopathy or significant consitutional symptoms. Pending CBC result might have patient return to oncology. ? ?- Repeat CBC w diff today ?

## 2022-02-25 NOTE — Assessment & Plan Note (Signed)
Patient reports this was incidentally found on imaging in 2021 after he had a fall. Since this time, he has been following close with neurosurgery. Last appointment 08/2021, I am unable to see these notes. Anthony Henderson reports he was told this is benign and they have just increased the interval of follow-up from 6 months to 1 year. He denies any focal symptoms. ? ?- Follow-up with neurosurgery in September ?

## 2022-02-25 NOTE — Telephone Encounter (Signed)
Requesting test results, please call pt back.  

## 2022-02-25 NOTE — Assessment & Plan Note (Signed)
Patient reports he has not seen Dr. Lovena Le since 2021. He denies chest pain, dyspnea, lightheadedness, syncopal episodes, palpitations. He no longer is on flecainide or anticoagulation. I have instructed him to follow-up with Dr. Lovena Le in the next couple of months. ? ?- Follow-up with cardiology ?

## 2022-02-25 NOTE — Assessment & Plan Note (Addendum)
[name] is 41yo person presenting to clinic today to establish care. He was previously seen intermittently at [facility].  ? ?He endorses a history of paroxysmal SVT s/p ablation, pre-diabetes. He currently does not take any medications. ? ?Anthony Henderson lives here in [place] with his wife and children. He has four sons, all of which are teenagers currently. He feels home is a safe environment for him. He is able to independently complete IADL's and ADL's. He does not use assistive device for ambulation. He previously smoked cigarettes, 1/4 pack daily for 10 years. He quit in 2015. No alcohol use. Occasional marijuana use, no other recreational drugs. ? ?He is up-to-date on Tdap vaccine. He also has had three COVID-19 vaccinations. He does not get the yearly influenza vaccination. He has also been screened for HIV and HCV in the past. We will obtain urine microalbumin/creatinine ratio today. ? ?Today he denies weight changes, fevers, chills, body aches, chest pain, dyspnea, nausea, vomiting, abdominal pain, constipation, diarrhea, paresthesias, numbness, vision changes, dysuria, hematuria, melena. He does note some intermittent night sweats that he describes as cold sweats. These have been chronic for him. ?

## 2022-02-26 LAB — CBC WITH DIFFERENTIAL/PLATELET
Basophils Absolute: 0 10*3/uL (ref 0.0–0.2)
Basos: 1 %
EOS (ABSOLUTE): 0 10*3/uL (ref 0.0–0.4)
Eos: 0 %
Hematocrit: 47.4 % (ref 37.5–51.0)
Hemoglobin: 15.8 g/dL (ref 13.0–17.7)
Immature Grans (Abs): 0 10*3/uL (ref 0.0–0.1)
Immature Granulocytes: 0 %
Lymphocytes Absolute: 1 10*3/uL (ref 0.7–3.1)
Lymphs: 41 %
MCH: 30.3 pg (ref 26.6–33.0)
MCHC: 33.3 g/dL (ref 31.5–35.7)
MCV: 91 fL (ref 79–97)
Monocytes Absolute: 0.3 10*3/uL (ref 0.1–0.9)
Monocytes: 12 %
Neutrophils Absolute: 1.1 10*3/uL — ABNORMAL LOW (ref 1.4–7.0)
Neutrophils: 46 %
Platelets: 282 10*3/uL (ref 150–450)
RBC: 5.22 x10E6/uL (ref 4.14–5.80)
RDW: 11.5 % — ABNORMAL LOW (ref 11.6–15.4)
WBC: 2.5 10*3/uL — CL (ref 3.4–10.8)

## 2022-02-26 LAB — LIPID PANEL
Chol/HDL Ratio: 2.6 ratio (ref 0.0–5.0)
Cholesterol, Total: 199 mg/dL (ref 100–199)
HDL: 77 mg/dL (ref >39)
LDL Chol Calc (NIH): 107 mg/dL — ABNORMAL HIGH (ref 0–99)
Triglycerides: 84 mg/dL (ref 0–149)
VLDL Cholesterol Cal: 15 mg/dL (ref 5–40)

## 2022-02-26 LAB — HEMOGLOBIN A1C
Est. average glucose Bld gHb Est-mCnc: 157 mg/dL
Hgb A1c MFr Bld: 7.1 % — ABNORMAL HIGH (ref 4.8–5.6)

## 2022-02-28 MED ORDER — EMPAGLIFLOZIN 10 MG PO TABS: 10.0000 mg | ORAL_TABLET | Freq: Every day | ORAL | 0 refills | Status: DC

## 2022-02-28 NOTE — Progress Notes (Signed)
Internal Medicine Clinic Attending ? ?Case discussed with Dr. Braswell  At the time of the visit.  We reviewed the resident?s history and exam and pertinent patient test results.  I agree with the assessment, diagnosis, and plan of care documented in the resident?s note.  ?

## 2022-02-28 NOTE — Addendum Note (Signed)
Addended bySanjuan Dame on: 02/28/2022 03:43 PM ? ? Modules accepted: Orders ? ?

## 2022-03-04 ENCOUNTER — Encounter: Payer: Self-pay | Admitting: Internal Medicine

## 2022-03-04 ENCOUNTER — Ambulatory Visit (INDEPENDENT_AMBULATORY_CARE_PROVIDER_SITE_OTHER): Payer: PRIVATE HEALTH INSURANCE | Admitting: Internal Medicine

## 2022-03-04 VITALS — BP 110/76 | Ht 72.0 in | Wt 134.8 lb

## 2022-03-04 DIAGNOSIS — E1165 Type 2 diabetes mellitus with hyperglycemia: Secondary | ICD-10-CM | POA: Insufficient documentation

## 2022-03-04 DIAGNOSIS — E785 Hyperlipidemia, unspecified: Secondary | ICD-10-CM | POA: Insufficient documentation

## 2022-03-04 MED ORDER — EMPAGLIFLOZIN 10 MG PO TABS: 10.0000 mg | ORAL_TABLET | Freq: Every day | ORAL | 1 refills | Status: DC

## 2022-03-04 MED ORDER — ACCU-CHEK GUIDE VI STRP: 1.0000 | ORAL_STRIP | Freq: Every day | 3 refills | Status: DC

## 2022-03-04 NOTE — Progress Notes (Signed)
?Name: Anthony Henderson  ?MRN/ DOB: [id number], 07-18-1981   ?Age/ Sex: 41 y.o., male   ? ?PCP: Sanjuan Dame, MD   ?Reason for Endocrinology Evaluation: Type 2 Diabetes Mellitus  ?   ?Date of Initial Endocrinology Visit: 03/04/2022   ? ? ?PATIENT IDENTIFIER: [name] is a 41 y.o. male with a past medical history of PSVT, T2DM. The patient presented for initial endocrinology clinic visit on 03/04/2022 for consultative assistance with his diabetes management.  ? ? ?HPI: ?Anthony Henderson was  ? ? ?Diagnosed with DM 2022 ?Prior Medications tried/Intolerance: He declines Metformin due to bad experience with family members. He was on Glipizide in 2022 but developed hypoglycemia.  ?Currently checking blood sugars multiple  x / day through freestyle libre  ?Hypoglycemia episodes : no               ?Hemoglobin A1c has ranged from 5.9% in 2014, peaking at 7.1% in 2023. ?Patient required assistance for hypoglycemia: no  ?Patient has required hospitalization within the last 1 year from hyper or hypoglycemia: no  ? ?In terms of diet, the patient eats 1x a day , snacks a  whole lot  ?Stopped drinking sugar-sweetened beverages  ? ? ?He has noted frequency with nocturia x2 ? ?He tells me he has hypoglycemia prior to his diagnosis of diabetes , with episodes of shaking and hunger  ? ? ? ?HOME DIABETES REGIMEN: ?Jardiance 10 mg daily ? ? ? ? ?Statin: no ?ACE-I/ARB: no ? ? ? ? ?CONTINUOUS GLUCOSE MONITORING RECORD INTERPRETATION   ? ?Dates of Recording: 3/18-3/31/2023 ? ?Sensor description:freestyle libre ? ?Results statistics: ?  ?CGM use % of time 81  ?Average and SD 152/17.4  ?Time in range   84     %  ?% Time Above 180 16  ?% Time above 250 0  ?% Time Below target 0  ? ?Glycemic patterns summary: Optimal Bg's during the day and night  ? ?Hyperglycemic episodes  postprandial  ? ?Hypoglycemic episodes occurred n/a ? ?Overnight periods: optimal  ? ? ? ? ?DIABETIC COMPLICATIONS: ?Microvascular complications:  ? ?Denies:  CKD, retinopathy, neuropathy  ?Last eye exam: Completed  ? ?Macrovascular complications:  ? ?Denies: CAD, PVD, CVA ? ? ?PAST HISTORY: ?Past Medical History:  ?Past Medical History:  ?Diagnosis Date  ? Chronic leukopenia   ? 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 Hiatal hernia   ? SVT (supraventricular tachycardia) [facility])   ? s/p ablation 03-10-14 by Dr Lovena Le  ? ?Past Surgical History:  ?Past Surgical History:  ?Procedure Laterality Date  ? ABLATION  03/10/2014  ? RFCA of left posterior septal accessory pathyway by Dr Lovena Le with resultant atrial fibrillation  ? SUPRAVENTRICULAR TACHYCARDIA ABLATION N/A 03/10/2014  ? Procedure: SUPRAVENTRICULAR TACHYCARDIA ABLATION;  Surgeon: Evans Lance, MD;  Location: [facility] CATH LAB;  Service: Cardiovascular;  Laterality: N/A;  ?  ?Social History:  reports that he quit smoking about 8 years ago. His smoking use included cigarettes. He has a 2.50 pack-year smoking history. He has never used smokeless tobacco. He reports that he does not currently use alcohol. He reports that he does not use drugs. ?Family History:  ?Family History  ?Problem Relation Age of Onset  ? Healthy Mother   ? Healthy Father   ? Diabetes Maternal Grandmother   ? Colon cancer Neg Hx   ? Esophageal cancer Neg Hx   ? Rectal cancer Neg Hx   ? Stomach cancer Neg Hx   ? ? ? ?  HOME MEDICATIONS: ?Allergies as of 03/04/2022   ? ?   Reactions  ? Penicillins Hives  ? Did it involve swelling of the face/tongue/throat, SOB, or low BP? No ?Did it involve sudden or severe rash/hives, skin peeling, or any reaction on the inside of your mouth or nose? Yes ?Did you need to seek medical attention at a hospital or doctor's office? No ?When did it last happen?      childhood ?If all above answers are ?NO?, may proceed with cephalosporin use.  ? ?  ? ?  ?Medication List  ?  ? ?  ? Accurate as of March 04, 2022  2:41 PM. If you have any questions, ask your nurse or doctor.  ?  ?  ? ?  ? ?empagliflozin 10 MG Tabs tablet ?Commonly known as:  Jardiance ?Take 1 tablet (10 mg total) by mouth daily before breakfast. ?  ?FreeStyle Libre 2 Sensor Misc ?See admin instructions. ?  ? ?  ? ? ? ?ALLERGIES: ?Allergies  ?Allergen Reactions  ? Penicillins Hives  ?  Did it involve swelling of the face/tongue/throat, SOB, or low BP? No ?Did it involve sudden or severe rash/hives, skin peeling, or any reaction on the inside of your mouth or nose? Yes ?Did you need to seek medical attention at a hospital or doctor's office? No ?When did it last happen?      childhood ?If all above answers are ?NO?, may proceed with cephalosporin use.  ? ? ? ?REVIEW OF SYSTEMS: ?A comprehensive ROS was conducted with the patient and is negative except as per HPI and below:  ?ROS ? ?  ?OBJECTIVE:  ? ?VITAL SIGNS: BP 110/76   Ht 6' (1.829 m)   Wt 134 lb 12.8 oz (61.1 kg)   BMI 18.28 kg/m?   ? ?PHYSICAL EXAM:  ?General: Pt appears well and is in NAD  ?Neck: General: Supple without adenopathy or carotid bruits. ?Thyroid: Thyroid size normal.  No goiter or nodules appreciated.  ?Lungs: Clear with good BS bilat with no rales, rhonchi, or wheezes  ?Heart: RRR with normal S1 and S2 and no gallops; no murmurs; no rub  ?Abdomen: Normoactive bowel sounds, soft, nontender, without masses or organomegaly palpable  ?Extremities:  ?Lower extremities - No pretibial edema. No lesions.  ?Neuro: MS is good with appropriate affect, pt is alert and Ox3  ? ? ? ?DATA REVIEWED: ? ?Lab Results  ?Component Value Date  ? HGBA1C 7.1 (H) 02/24/2022  ? HGBA1C 6.1 (H) 07/31/2019  ? HGBA1C 6.4 04/11/2017  ? ?Lab Results  ?Component Value Date  ? LDLCALC 107 (H) 02/24/2022  ? CREATININE 0.98 01/22/2022  ? ?Lab Results  ?Component Value Date  ? MICRALBCREAT 4 02/24/2022  ? ? ?Lab Results  ?Component Value Date  ? CHOL 199 02/24/2022  ? HDL 77 02/24/2022  ? LDLCALC 107 (H) 02/24/2022  ? TRIG 84 02/24/2022  ? CHOLHDL 2.6 02/24/2022  ?     ? Latest Reference Range & Units 01/22/22 17:54  ?Sodium 135 - 145 mmol/L 135   ?Potassium 3.5 - 5.1 mmol/L 4.1  ?Chloride 98 - 111 mmol/L 98  ?CO2 22 - 32 mmol/L 27  ?Glucose 70 - 99 mg/dL 121 (H)  ?BUN 6 - 20 mg/dL 12  ?Creatinine 0.61 - 1.24 mg/dL 0.98  ?Calcium 8.9 - 10.3 mg/dL 9.6  ?Anion gap 5 - 15  10  ?GFR, Estimated >60 mL/min >60  ? ? Latest Reference Range & Units 02/24/22 10:59  ?Total  CHOL/HDL Ratio 0.0 - 5.0 ratio 2.6  ?Cholesterol, Total 100 - 199 mg/dL 199  ?HDL Cholesterol >39 mg/dL 77  ?Triglycerides 0 - 149 mg/dL 84  ?VLDL Cholesterol Cal 5 - 40 mg/dL 15  ? ? ?ASSESSMENT / PLAN / RECOMMENDATIONS:  ? ?1) Type 2 Diabetes Mellitus, optimally controlled, With out complications - Most recent A1c of 7.1%. Goal A1c <7.0%.   ? ?Plan: ?GENERAL: ?I have discussed with the patient the pathophysiology of diabetes. We went over the natural progression of the disease. We talked about both insulin resistance and insulin deficiency. We stressed the importance of lifestyle changes including diet and exercise. I explained the complications associated with diabetes including retinopathy, nephropathy, neuropathy as well as increased risk of cardiovascular disease. We went over the benefit seen with glycemic control.  ? ?I explained to the patient that diabetic patients are at higher than normal risk for amputations.  ?I have offered him a referral to our RD, I do believe that he will benefit from this, patient tends to snack through the day due to his work schedule.  Today we discussed low-carb options for snacks ?He declines to use metformin due to negative experience 2 family members ?He also does not want anything to make him lose weight, I did explain to him that Jardiance can cause weight loss and we opted to remain on the current dose and not increase ?He was on glipizide at some point but this caused hypoglycemia ?We may consider Januvia in the future if needed due to weight neutrality and low risk of hypoglycemia ?He was advised to check fingerstick if the CGM shows hypoglycemia due to  skewed results ?He was given Accu-Chek meter ? ?MEDICATIONS: ?Continue Jardiance 10 mg daily ? ?EDUCATION / INSTRUCTIONS: ?BG monitoring instructions: Patient is instructed to check his blood sugars 3 times a week ?C

## 2022-03-04 NOTE — Patient Instructions (Addendum)
-   Continue Jardiance 10 mg daily  ? ? ?Choose healthy, lower carb lower calorie snacks: toss salad, vegetables, cottage cheese, peanut butter, low fat cheese / string cheese, lower sodium deli meat, tuna salad or chicken salad ? ? ?HOW TO TREAT LOW BLOOD SUGARS (Blood sugar LESS THAN 70 MG/DL) ?Please follow the RULE OF 15 for the treatment of hypoglycemia treatment (when your (blood sugars are less than 70 mg/dL)  ? ?STEP 1: Take 15 grams of carbohydrates when your blood sugar is low, which includes:  ?3-4 GLUCOSE TABS  OR ?3-4 OZ OF JUICE OR REGULAR SODA OR ?ONE TUBE OF GLUCOSE GEL   ? ?STEP 2: RECHECK blood sugar in 15 MINUTES ?STEP 3: If your blood sugar is still low at the 15 minute recheck --> then, go back to STEP 1 and treat AGAIN with another 15 grams of carbohydrates. ? ?

## 2022-03-07 ENCOUNTER — Encounter: Payer: Self-pay | Admitting: Internal Medicine

## 2022-03-31 IMAGING — MR MR ABDOMEN WO/W CM
12 of 19 series · 28 of 48 positions shown · IV contrast (multihance)
Comparison: Ultrasound July 08, 2021

CLINICAL DATA: Further characterization of abnormality seen on
prior ultrasound.

EXAM:
MRI ABDOMEN WITHOUT AND WITH CONTRAST
TECHNIQUE: Multiplanar multisequence MR imaging of the abdomen was performed
both before and after the administration of intravenous contrast.
CONTRAST:  15mL MULTIHANCE GADOBENATE DIMEGLUMINE 529 MG/ML IV SOLN

[Series 2: cor haste · coronal · 5.0mm · 0.68mm/px · 2 of 29 slices shown]
[im 1/29]
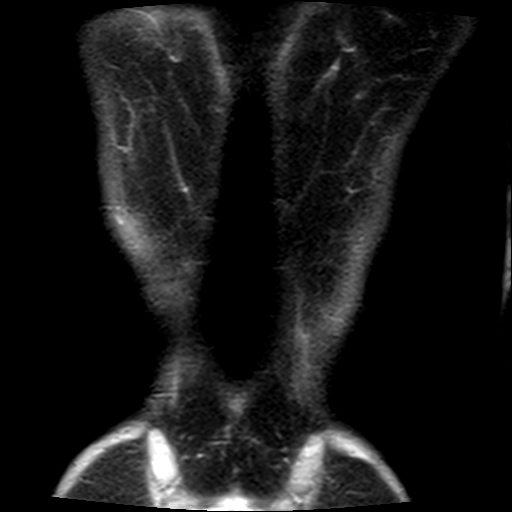
[im 29/29]
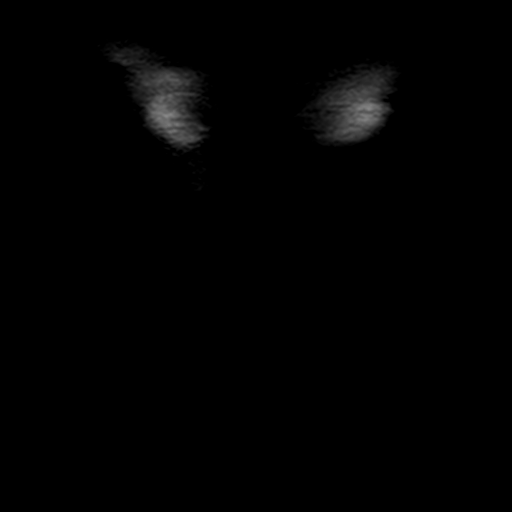

[Series 3: T2 fat-sat · axial · 6.0mm · 1.09mm/px · z∈[-78,+124]mm · 2 of 29 slices shown]
[im 1/29]
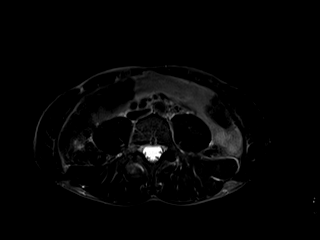
[im 29/29]
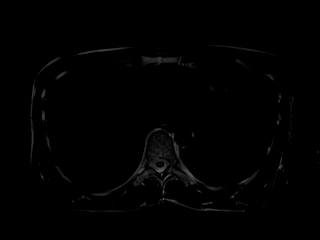

[Series 6: MRCP · sagittal · 0.89mm/px · 2 of 13 slices shown]
[im 1/13]
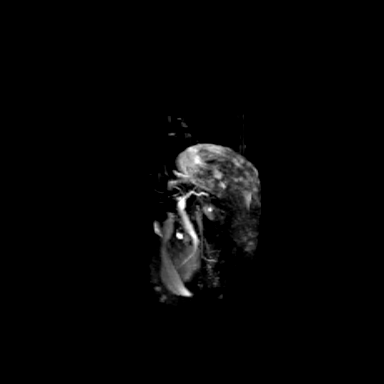
[im 13/13]
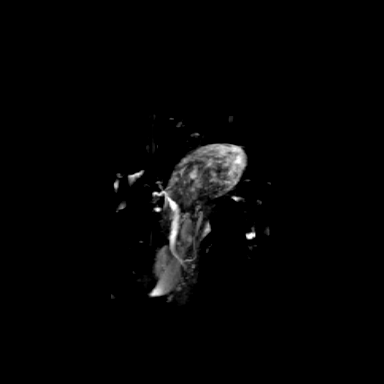

[Series 8: ep2d_[id number]_trig · axial · 6.0mm · 1.82mm/px · z∈[-81,+127]mm · 3 of 88 slices shown]
[im 1/88]
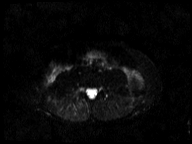
[im 44/88]
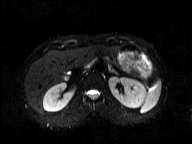
[im 88/88]
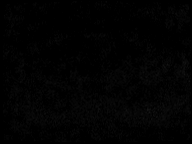

[Series 9: ep2d_[id number]_trig_adc · axial · 6.0mm · 1.82mm/px · 1 of 30 slices shown]
[im 1/30]
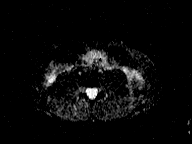

[Series 10: axial haste · axial · 6.0mm · 0.68mm/px · 1 of 33 slices shown]
[im 1/33]
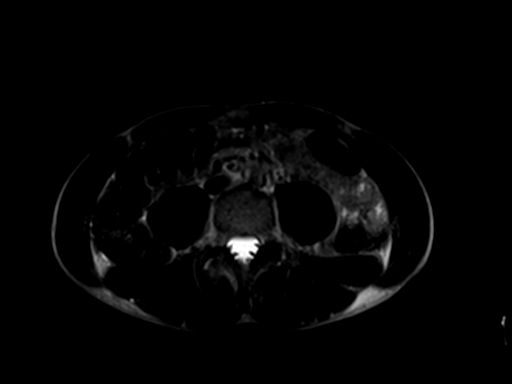

[Series 11: T1 · axial · 6.0mm · 0.68mm/px · z∈[-83,+129]mm · 3 of 66 slices shown]
[im 1/66]
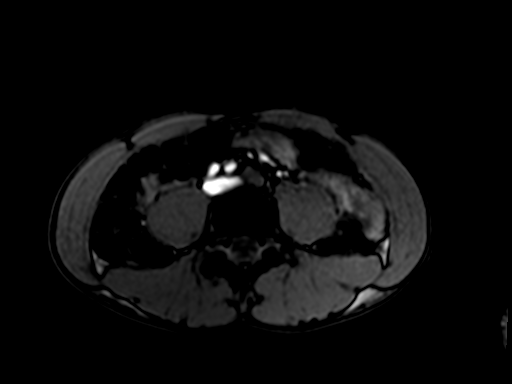
[im 33/66]
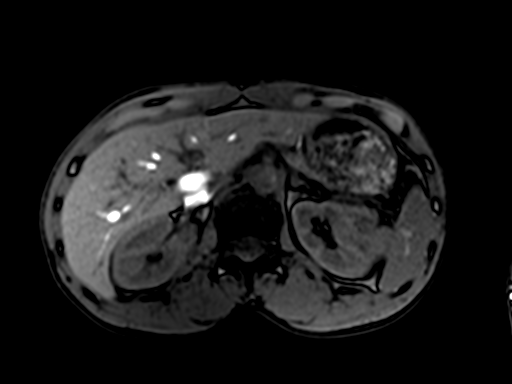
[im 66/66]
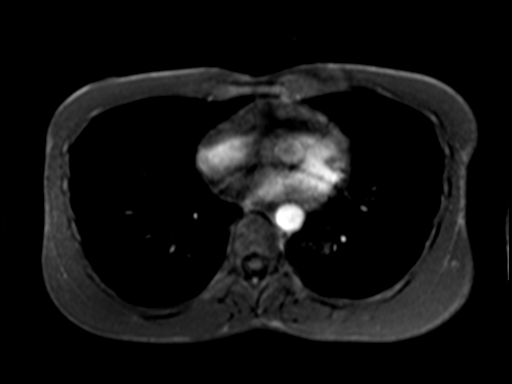

[Series 12: bSSFP · axial · 4.0mm · 0.68mm/px · z∈[-97,+143]mm · 2 of 61 slices shown]
[im 1/61]
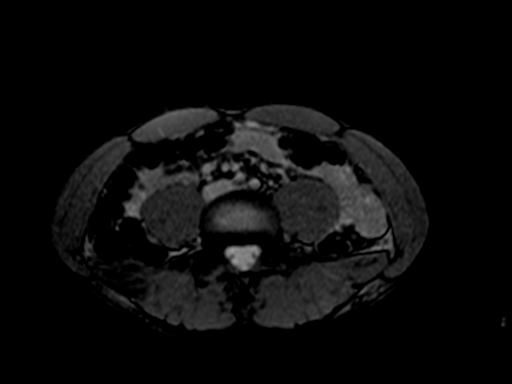
[im 61/61]
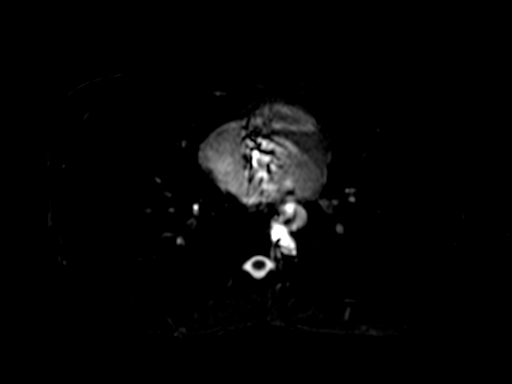

[Series 13: T1 dynamic · axial · non-contrast · 2.5mm · 0.74mm/px · z∈[-86,+131]mm · 3 of 88 slices shown]
[im 1/88]
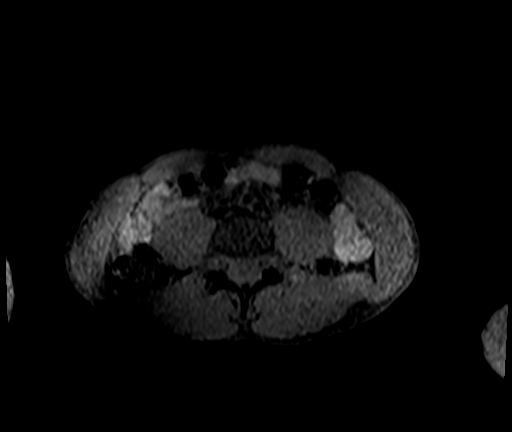
[im 44/88]
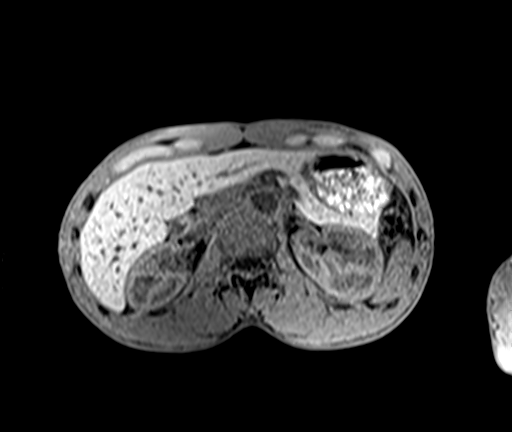
[im 88/88]
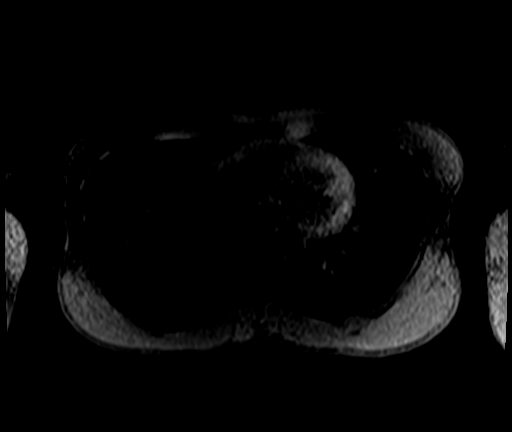

[Series 14: T1 dynamic post-contrast · axial · 2.5mm · 0.74mm/px · z∈[-86,+131]mm · 3 of 88 slices shown (1 of 3)]
[im 1/88]
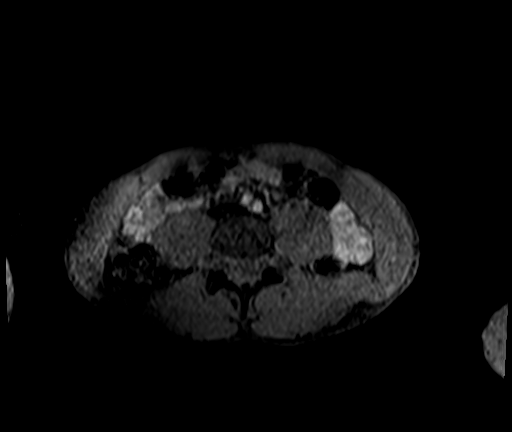
[im 44/88]
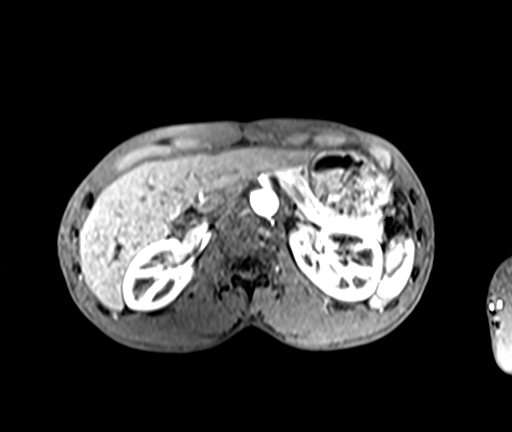
[im 88/88]
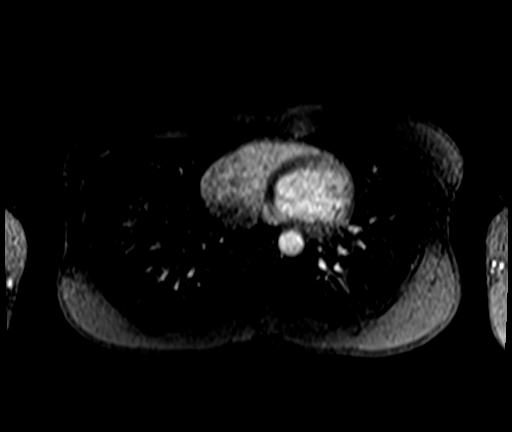

[Series 15: T1 dynamic post-contrast · axial · 2.5mm · 0.74mm/px · z∈[-86,+131]mm · 3 of 88 slices shown (2 of 3)]
[im 1/88]
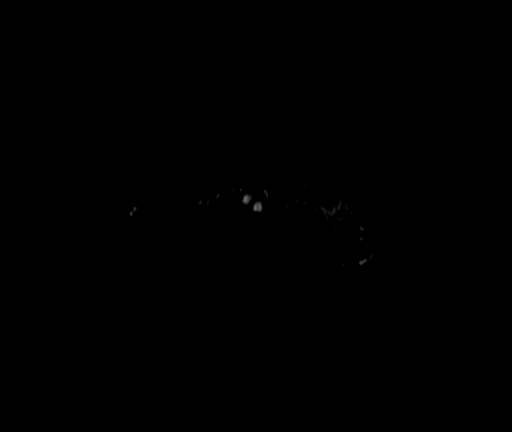
[im 44/88]
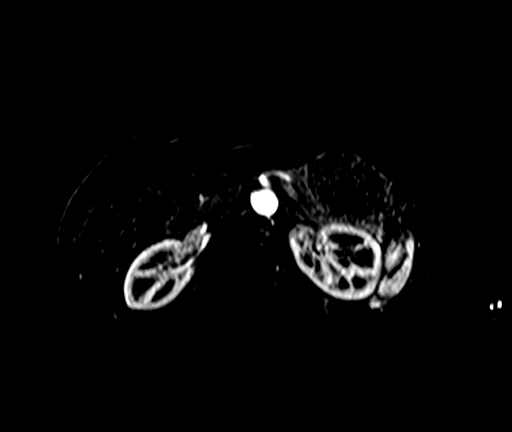
[im 88/88]
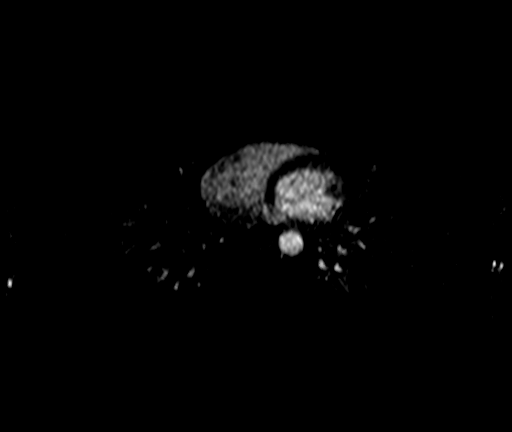

[Series 16: T1 dynamic post-contrast · axial · 2.5mm · 0.74mm/px · z∈[-86,+131]mm · 3 of 88 slices shown (3 of 3)]
[im 1/88]
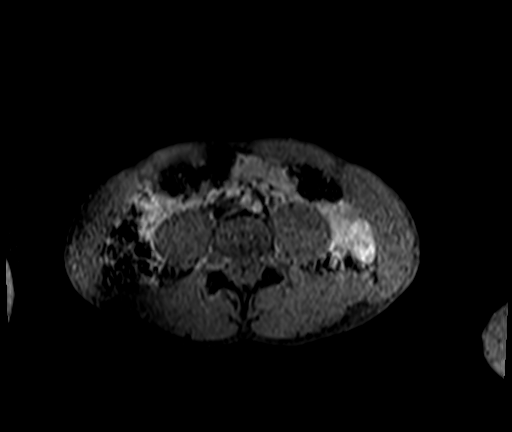
[im 44/88]
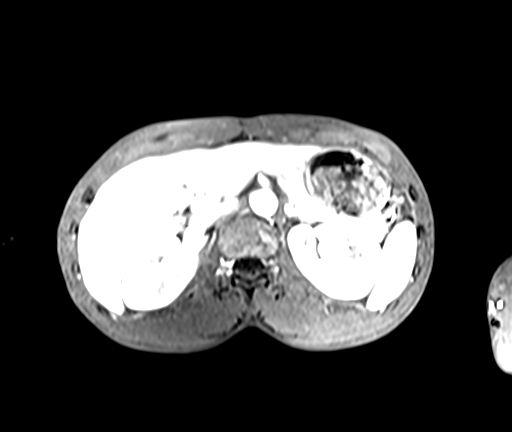
[im 88/88]
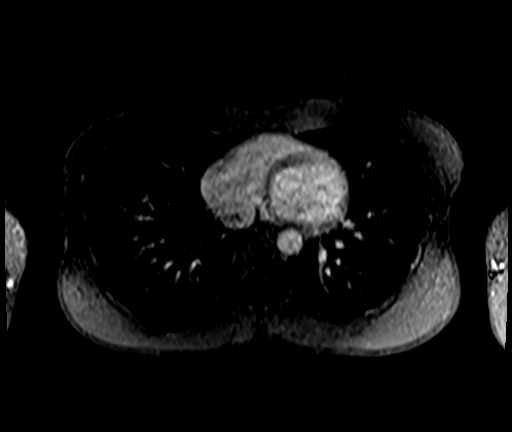

[28 of 48 positions shown; findings below may reference images not displayed]

FINDINGS: Lower chest: No acute abnormality.

Hepatobiliary: No hepatic steatosis. T2 hyperintense
well-circumscribed 6 mm segment II hepatic lesion which is
demonstrates peripheral nodular discontinuous enhancement consistent
with a benign hepatic hemangioma. Scattered tiny benign hepatic
cysts. No solid enhancing hepatic lesions. Gallbladder is
unremarkable. No biliary ductal dilation.

Pancreas: No mass, inflammatory changes, or other parenchymal
abnormality identified.

Spleen:  Within normal limits in size and appearance.

Adrenals/Urinary Tract: No masses identified. No evidence of
hydronephrosis.

Stomach/Bowel: Visualized portions within the abdomen are
unremarkable.

Vascular/Lymphatic: No pathologically enlarged lymph nodes
identified. No abdominal aortic aneurysm demonstrated.

Other:  No abdominal ascites.

Musculoskeletal: No suspicious bone lesions identified.
IMPRESSION: Correlating with prior ultrasound there is a 6 mm segment II hepatic
lesion consistent with a benign hepatic hemangioma. No solid
enhancing hepatic lesions.

## 2022-04-18 ENCOUNTER — Telehealth: Payer: Self-pay

## 2022-04-18 ENCOUNTER — Other Ambulatory Visit: Payer: Self-pay

## 2022-04-18 DIAGNOSIS — E1165 Type 2 diabetes mellitus with hyperglycemia: Secondary | ICD-10-CM

## 2022-04-18 MED ORDER — ACCU-CHEK GUIDE VI STRP: ORAL_STRIP | 3 refills | Status: DC

## 2022-04-18 NOTE — Telephone Encounter (Signed)
Patient notified and will contact us if sugar start to be over 150 consistently.  ?

## 2022-04-18 NOTE — Telephone Encounter (Signed)
Patient states that he has been good on the Jardiance but has been losing weight. Patient states that he can't afford to lose anymore weight.  ?

## 2022-06-06 ENCOUNTER — Other Ambulatory Visit: Payer: PRIVATE HEALTH INSURANCE

## 2022-06-14 ENCOUNTER — Ambulatory Visit (INDEPENDENT_AMBULATORY_CARE_PROVIDER_SITE_OTHER): Payer: PRIVATE HEALTH INSURANCE | Admitting: Student

## 2022-06-14 ENCOUNTER — Encounter: Payer: Self-pay | Admitting: Student

## 2022-06-14 ENCOUNTER — Other Ambulatory Visit: Payer: Self-pay

## 2022-06-14 VITALS — BP 117/73 | HR 77 | Temp 98.3°F | Ht 72.0 in | Wt 131.4 lb

## 2022-06-14 DIAGNOSIS — E119 Type 2 diabetes mellitus without complications: Secondary | ICD-10-CM

## 2022-06-14 DIAGNOSIS — Z7984 Long term (current) use of oral hypoglycemic drugs: Secondary | ICD-10-CM

## 2022-06-14 DIAGNOSIS — D72819 Decreased white blood cell count, unspecified: Secondary | ICD-10-CM | POA: Diagnosis not present

## 2022-06-14 DIAGNOSIS — I471 Supraventricular tachycardia: Secondary | ICD-10-CM | POA: Diagnosis not present

## 2022-06-14 DIAGNOSIS — E785 Hyperlipidemia, unspecified: Secondary | ICD-10-CM

## 2022-06-14 DIAGNOSIS — R7303 Prediabetes: Secondary | ICD-10-CM

## 2022-06-14 MED ORDER — PRAVASTATIN SODIUM 40 MG PO TABS: 40.0000 mg | ORAL_TABLET | Freq: Every day | ORAL | 2 refills | Status: DC

## 2022-06-14 NOTE — Assessment & Plan Note (Signed)
This is a chronic issue for Anthony Henderson. Last CBC showed mild, stable neutropenia. Will repeat this lab today for monitoring.  - Repeat CBC with differential

## 2022-06-14 NOTE — Assessment & Plan Note (Signed)
Chronic, stable issue for Anthony Henderson. Has had one episode of SVT over last six months that resolved with Valsalva maneuver. I have instructed Mr. Pokorski to return to clinic if these episodes become more frequent. I have instructed him to go to the Emergency Department for an episode that does not stop with Valsalva.   - Follow-up with cardiology - Return if symptoms worsen

## 2022-06-14 NOTE — Assessment & Plan Note (Signed)
Since our last visit. Anthony Henderson has seen an endocrinologist, who is managing his diabetes. He has been compliant with Jardiance with minimal issues. Does report he notices polyuria when taking the medication, but this has been tolerable. He was doing well with his diet until a few weeks ago when he and his partner separated. Since then, he has had limited resources and has not had access to more healthy options. He is to follow-up with his endocrinologist later this month. We will repeat A1c today and start statin.  - Repeat A1c - Continue daily Jardiance - Start daily pravastatin '40mg'$  - Follow-up with endocrinologist

## 2022-06-14 NOTE — Progress Notes (Signed)
   CC: regular follow-up  HPI:  Anthony Henderson is a 41 y.o. person with type II diabetes mellitus, chronic leukopenia, paroxysmal supraventricular tachycardia s/p ablation 2015 presenting to [facility] for regular follow-up.  Please see problem-based list for further details, assessments, and plans.  Past Medical History:  Diagnosis Date   Chronic leukopenia    Ganglioglioma of brain (HCC)    Hiatal hernia    SVT (supraventricular tachycardia) ([place])    s/p ablation 03-10-14 by Dr Lovena Le   Review of Systems:  As per HPI  Physical Exam:  Vitals:   06/14/22 1416  BP: 117/73  Pulse: 77  Temp: 98.3 F (36.8 C)  TempSrc: Oral  SpO2: 99%  Weight: 131 lb 6.4 oz (59.6 kg)  Height: 6' (1.829 m)   General: Resting comfortably in chair, no acute distress CV: Regular rate, rhythm. No murmurs appreciated. Warm extremities. Pulm: Normal work of breathing on room air. Clear to ausculation bilaterally GI: Abdomen soft, non-tender, non-distended.  MSK: Normal bulk, tone. No peripheral edema noted Neuro: Awake, alert, conversing appropriately. Psych: Normal mood, affect, speech.  Assessment & Plan:   Chronic leukopenia This is a chronic issue for Anthony Henderson. Last CBC showed mild, stable neutropenia. Will repeat this lab today for monitoring.  - Repeat CBC with differential   Dyslipidemia LDL 107 during last visit. Although ASCVD risk is low, with patient's age and diagnosis of diabetes will initiate moderate-intensity statin.   - Pravastatin '40mg'$  daily  PSVT (paroxysmal supraventricular tachycardia) (HCC) Chronic, stable issue for Anthony Henderson. Has had one episode of SVT over last six months that resolved with Valsalva maneuver. I have instructed Anthony Henderson to return to clinic if these episodes become more frequent. I have instructed him to go to the Emergency Department for an episode that does not stop with Valsalva.   - Follow-up with cardiology - Return if symptoms  worsen  Type II diabetes mellitus ([place]) Since our last visit. Anthony Henderson has seen an endocrinologist, who is managing his diabetes. He has been compliant with Jardiance with minimal issues. Does report he notices polyuria when taking the medication, but this has been tolerable. He was doing well with his diet until a few weeks ago when he and his partner separated. Since then, he has had limited resources and has not had access to more healthy options. He is to follow-up with his endocrinologist later this month. We will repeat A1c today and start statin.  - Repeat A1c - Continue daily Jardiance - Start daily pravastatin '40mg'$  - Follow-up with endocrinologist  Patient discussed with Dr. Lise Auer, MD Internal Medicine PGY-3 Pager: [phone]

## 2022-06-14 NOTE — Assessment & Plan Note (Signed)
LDL 107 during last visit. Although ASCVD risk is low, with patient's age and diagnosis of diabetes will initiate moderate-intensity statin.   - Pravastatin '40mg'$  daily

## 2022-06-14 NOTE — Patient Instructions (Signed)
Mr.Ashby G Broad, it was a pleasure seeing you today!  Today we discussed: - Diabetes: continue with Jardiance and make sure to follow-up with your diabetes doctor. I will call you with the results of today's blood work.  - Cholesterol: I have started you on a medication called pravastatin. Please make sure to take this once daily.   I have ordered the following labs today:   Lab Orders         CBC with Diff         Hemoglobin A1c      I have ordered the following medication/changed the following medications:   Start the following medications: Meds ordered this encounter  Medications   pravastatin (PRAVACHOL) 40 MG tablet    Sig: Take 1 tablet (40 mg total) by mouth daily.    Dispense:  90 tablet    Refill:  2     Follow-up: 6 months   Please make sure to arrive 15 minutes prior to your next appointment. If you arrive late, you may be asked to reschedule.   We look forward to seeing you next time. Please call our clinic at [phone] if you have any questions or concerns. The best time to call is Monday-Friday from 9am-4pm, but there is someone available 24/7. If after hours or the weekend, call the main hospital number and ask for the Internal Medicine Resident On-Call. If you need medication refills, please notify your pharmacy one week in advance and they will send [place] a request.  Thank you for letting us take part in your care. Wishing you the best!  Thank you, Sanjuan Dame, MD

## 2022-06-15 LAB — CBC WITH DIFFERENTIAL/PLATELET
Basophils Absolute: 0 10*3/uL (ref 0.0–0.2)
Basos: 1 %
EOS (ABSOLUTE): 0 10*3/uL (ref 0.0–0.4)
Eos: 1 %
Hematocrit: 47.6 % (ref 37.5–51.0)
Hemoglobin: 16 g/dL (ref 13.0–17.7)
Immature Grans (Abs): 0 10*3/uL (ref 0.0–0.1)
Immature Granulocytes: 0 %
Lymphocytes Absolute: 1.2 10*3/uL (ref 0.7–3.1)
Lymphs: 39 %
MCH: 30.5 pg (ref 26.6–33.0)
MCHC: 33.6 g/dL (ref 31.5–35.7)
MCV: 91 fL (ref 79–97)
Monocytes Absolute: 0.3 10*3/uL (ref 0.1–0.9)
Monocytes: 10 %
Neutrophils Absolute: 1.5 10*3/uL (ref 1.4–7.0)
Neutrophils: 49 %
Platelets: 267 10*3/uL (ref 150–450)
RBC: 5.25 x10E6/uL (ref 4.14–5.80)
RDW: 11.6 % (ref 11.6–15.4)
WBC: 3 10*3/uL — ABNORMAL LOW (ref 3.4–10.8)

## 2022-06-15 LAB — HEMOGLOBIN A1C
Est. average glucose Bld gHb Est-mCnc: 148 mg/dL
Hgb A1c MFr Bld: 6.8 % — ABNORMAL HIGH (ref 4.8–5.6)

## 2022-06-15 LAB — SPECIMEN STATUS REPORT

## 2022-06-15 NOTE — Progress Notes (Signed)
Internal Medicine Clinic Attending ? ?Case discussed with Dr. Braswell  At the time of the visit.  We reviewed the resident?s history and exam and pertinent patient test results.  I agree with the assessment, diagnosis, and plan of care documented in the resident?s note.  ?

## 2022-06-27 ENCOUNTER — Encounter: Payer: Self-pay | Admitting: Internal Medicine

## 2022-06-27 ENCOUNTER — Ambulatory Visit (INDEPENDENT_AMBULATORY_CARE_PROVIDER_SITE_OTHER): Payer: PRIVATE HEALTH INSURANCE | Admitting: Internal Medicine

## 2022-06-27 VITALS — BP 110/70 | HR 74 | Ht 72.0 in | Wt 131.0 lb

## 2022-06-27 DIAGNOSIS — E785 Hyperlipidemia, unspecified: Secondary | ICD-10-CM | POA: Diagnosis not present

## 2022-06-27 DIAGNOSIS — E119 Type 2 diabetes mellitus without complications: Secondary | ICD-10-CM

## 2022-06-27 MED ORDER — ATORVASTATIN CALCIUM 20 MG PO TABS: 20.0000 mg | ORAL_TABLET | Freq: Every day | ORAL | 3 refills | Status: DC

## 2022-06-27 MED ORDER — EMPAGLIFLOZIN 10 MG PO TABS: 10.0000 mg | ORAL_TABLET | Freq: Every day | ORAL | 3 refills | Status: DC

## 2022-06-27 NOTE — Patient Instructions (Signed)
-   Continue Jardiance 10 mg daily      HOW TO TREAT LOW BLOOD SUGARS (Blood sugar LESS THAN 70 MG/DL) Please follow the RULE OF 15 for the treatment of hypoglycemia treatment (when your (blood sugars are less than 70 mg/dL)   STEP 1: Take 15 grams of carbohydrates when your blood sugar is low, which includes:  3-4 GLUCOSE TABS  OR 3-4 OZ OF JUICE OR REGULAR SODA OR ONE TUBE OF GLUCOSE GEL    STEP 2: RECHECK blood sugar in 15 MINUTES STEP 3: If your blood sugar is still low at the 15 minute recheck --> then, go back to STEP 1 and treat AGAIN with another 15 grams of carbohydrates.

## 2022-06-27 NOTE — Progress Notes (Signed)
Name: Anthony Henderson  MRN/ DOB: [id number], 1981/04/03   Age/ Sex: 41 y.o., male    PCP: Sanjuan Dame, MD   Reason for Endocrinology Evaluation: Type 2 Diabetes Mellitus     Date of Initial Endocrinology Visit: 03/04/2022    PATIENT IDENTIFIER: [name] is a 41 y.o. male with a past medical history of PSVT, T2DM. The patient presented for initial endocrinology clinic visit on 03/04/2022 for consultative assistance with his diabetes management.    HPI: Anthony Henderson was    Diagnosed with DM 2022 Prior Medications tried/Intolerance: He declines Metformin due to bad experience with family members. He was on Glipizide in 2022 but developed hypoglycemia.               Hemoglobin A1c has ranged from 5.9% in 2014, peaking at 7.1% in 2023.  On his initial visit to our clinic he had an A1c of 7.1% he was on Jardiance only which we continued  SUBJECTIVE:   During the last visit (03/04/2022): A1c 7.1%   Today (06/27/22): Anthony Henderson is here for follow-up on diabetes management.  He checks his blood sugars 1 times daily. The patient has not had hypoglycemic episodes since the last clinic visit  Denies nausea, vomiting or diarrhea    HOME DIABETES REGIMEN: Jardiance 10 mg daily     Statin: yes  ACE-I/ARB: no    METER DOWNLOAD:   90 day average 129  94 -425   DIABETIC COMPLICATIONS: Microvascular complications:   Denies: CKD, retinopathy, neuropathy  Last eye exam: Completed   Macrovascular complications:   Denies: CAD, PVD, CVA   PAST HISTORY: Past Medical History:  Past Medical History:  Diagnosis Date   Chronic leukopenia    Ganglioglioma of brain ([place])    Hiatal hernia    SVT (supraventricular tachycardia) (HCC)    s/p ablation 03-10-14 by Dr Lovena Le   Past Surgical History:  Past Surgical History:  Procedure Laterality Date   ABLATION  03/10/2014   RFCA of left posterior septal accessory pathyway by Dr Lovena Le with resultant atrial  fibrillation   SUPRAVENTRICULAR TACHYCARDIA ABLATION N/A 03/10/2014   Procedure: SUPRAVENTRICULAR TACHYCARDIA ABLATION;  Surgeon: Evans Lance, MD;  Location[facility] CATH LAB;  Service: Cardiovascular;  Laterality: N/A;    Social History:  reports that he quit smoking about 8 years ago. His smoking use included cigarettes. He has a 2.50 pack-year smoking history. He has never used smokeless tobacco. He reports that he does not currently use alcohol. He reports that he does not use drugs. Family History:  Family History  Problem Relation Age of Onset   Healthy Mother    Healthy Father    Diabetes Maternal Grandmother    Colon cancer Neg Hx    Esophageal cancer Neg Hx    Rectal cancer Neg Hx    Stomach cancer Neg Hx      HOME MEDICATIONS: Allergies as of 06/27/2022       Reactions   Penicillins Hives   Did it involve swelling of the face/tongue/throat, SOB, or low BP? No Did it involve sudden or severe rash/hives, skin peeling, or any reaction on the inside of your mouth or nose? Yes Did you need to seek medical attention at a hospital or doctor's office? No When did it last happen?      childhood If all above answers are "NO", may proceed with cephalosporin use.        Medication List  Accurate as of June 27, 2022  2:40 PM. If you have any questions, ask your nurse or doctor.          STOP taking these medications    FreeStyle Libre 2 Sensor Misc Stopped by: Dorita Sciara, MD       TAKE these medications    Accu-Chek Guide test strip Generic drug: glucose blood Check 3 times daily   empagliflozin 10 MG Tabs tablet Commonly known as: Jardiance Take 1 tablet (10 mg total) by mouth daily before breakfast.   pravastatin 40 MG tablet Commonly known as: Pravachol Take 1 tablet (40 mg total) by mouth daily.         ALLERGIES: Allergies  Allergen Reactions   Penicillins Hives    Did it involve swelling of the face/tongue/throat, SOB, or low BP?  No Did it involve sudden or severe rash/hives, skin peeling, or any reaction on the inside of your mouth or nose? Yes Did you need to seek medical attention at a hospital or doctor's office? No When did it last happen?      childhood If all above answers are "NO", may proceed with cephalosporin use.     REVIEW OF SYSTEMS: A comprehensive ROS was conducted with the patient and is negative except as per HPI     OBJECTIVE:   VITAL SIGNS: BP 110/70 (BP Location: Left Arm, Patient Position: Sitting, Cuff Size: Small)   Pulse 74   Ht 6' (1.829 m)   Wt 131 lb (59.4 kg)   SpO2 96%   BMI 17.77 kg/m    PHYSICAL EXAM:  General: Pt appears well and is in NAD  Neck: General: Supple without adenopathy or carotid bruits. Thyroid: Thyroid size normal.  No goiter or nodules appreciated.  Lungs: Clear with good BS bilat with no rales, rhonchi, or wheezes  Heart: RRR  Abdomen: Normoactive bowel sounds, soft, nontender, without masses or organomegaly palpable  Extremities:  Lower extremities - No pretibial edema. No lesions.  Neuro: MS is good with appropriate affect, pt is alert and Ox3   DM Foot Exam 06/27/2022  The skin of the feet is intact without sores or ulcerations. The pedal pulses are 2+ on right and 2+ on left. The sensation is intact to a screening 5.07, 10 gram monofilament bilaterally   DATA REVIEWED:  Lab Results  Component Value Date   HGBA1C 6.8 (H) 06/14/2022   HGBA1C 7.1 (H) 02/24/2022   HGBA1C 6.1 (H) 07/31/2019   Lab Results  Component Value Date   LDLCALC 107 (H) 02/24/2022   CREATININE 0.98 01/22/2022   Lab Results  Component Value Date   MICRALBCREAT 4 02/24/2022    Lab Results  Component Value Date   CHOL 199 02/24/2022   HDL 77 02/24/2022   LDLCALC 107 (H) 02/24/2022   TRIG 84 02/24/2022   CHOLHDL 2.6 02/24/2022        Latest Reference Range & Units 01/22/22 17:54  Sodium 135 - 145 mmol/L 135  Potassium 3.5 - 5.1 mmol/L 4.1  Chloride 98 - 111  mmol/L 98  CO2 22 - 32 mmol/L 27  Glucose 70 - 99 mg/dL 121 (H)  BUN 6 - 20 mg/dL 12  Creatinine 0.61 - 1.24 mg/dL 0.98  Calcium 8.9 - 10.3 mg/dL 9.6  Anion gap 5 - 15  10  GFR, Estimated >60 mL/min >60    Latest Reference Range & Units 02/24/22 10:59  Total CHOL/HDL Ratio 0.0 - 5.0 ratio 2.6  Cholesterol, Total  100 - 199 mg/dL 199  HDL Cholesterol >39 mg/dL 77  Triglycerides 0 - 149 mg/dL 84  VLDL Cholesterol Cal 5 - 40 mg/dL 15    ASSESSMENT / PLAN / RECOMMENDATIONS:   1) Type 2 Diabetes Mellitus, optimally controlled, With out complications - Most recent A1c of 6.8%. Goal A1c <7.0%.    -I have praised the patient improved glycemic control -He does admit to recent dietary indiscretions due to shortage of funds -No changes at this time -He had declined metformin use in the past due to negative experience 2 family members -He also does not want any medications that would cause weight loss, may consider Januvia if necessary in the future  -He was on glipizide at some point but this caused hypoglycemia  MEDICATIONS: Continue Jardiance 10 mg daily  EDUCATION / INSTRUCTIONS: BG monitoring instructions: Patient is instructed to check his blood sugars 3 times a week Call [facility] if: BG persistently < 70  I reviewed the Rule of 15 for the treatment of hypoglycemia in detail with the patient. Literature supplied.   2) Diabetic complications:  Eye: Does not have known diabetic retinopathy.  Neuro/ Feet: Does not have known diabetic peripheral neuropathy. Renal: Patient does not have known baseline CKD. He is not on an ACEI/ARB at present.   3) Dyslipidemia:   - His LDL was above goal, he was started on pravastatin 40 mg by his PCP recently July 2023 -The patient does endorse lower extremity pain but he is not sure if this is related to statin use tonight -I have asked the patient to start MVI -I am also going to switch pravastatin 40 mg to atorvastatin 20  mg and see how he does with it  Medication Stop pravastatin 40 mg daily Start atorvastatin 20 mg daily  Follow-up in 4 months  Signed electronically by: Mack Guise, MD  [facility] Endocrinology  [facility] [address] [place], [place] [address] Phone: [phone] FAX: [phone]   CC: Sanjuan Dame, MD [place] [address] Phone: [phone]  Fax: [phone]    Return to Endocrinology clinic as below: No future appointments.

## 2022-07-15 ENCOUNTER — Other Ambulatory Visit: Payer: Self-pay | Admitting: Neurosurgery

## 2022-07-15 ENCOUNTER — Other Ambulatory Visit (HOSPITAL_COMMUNITY): Payer: Self-pay | Admitting: Neurosurgery

## 2022-07-15 DIAGNOSIS — D432 Neoplasm of uncertain behavior of brain, unspecified: Secondary | ICD-10-CM

## 2022-08-01 ENCOUNTER — Ambulatory Visit (HOSPITAL_COMMUNITY): Payer: PRIVATE HEALTH INSURANCE

## 2022-08-01 ENCOUNTER — Encounter (HOSPITAL_COMMUNITY): Payer: Self-pay

## 2022-08-26 ENCOUNTER — Ambulatory Visit (HOSPITAL_COMMUNITY)
Admission: RE | Admit: 2022-08-26 | Discharge: 2022-08-26 | Disposition: A | Discharge status: Home / Self Care | Payer: BC Managed Care – PPO | Source: Ambulatory Visit | Attending: Neurosurgery | Admitting: Neurosurgery

## 2022-08-26 DIAGNOSIS — D432 Neoplasm of uncertain behavior of brain, unspecified: Secondary | ICD-10-CM | POA: Diagnosis not present

## 2022-08-26 DIAGNOSIS — D489 Neoplasm of uncertain behavior, unspecified: Secondary | ICD-10-CM | POA: Diagnosis not present

## 2022-08-26 MED ORDER — GADOPICLENOL 0.5 MMOL/ML IV SOLN
7.5000 mL | Freq: Once | INTRAVENOUS | Status: AC | PRN
  Administered 2022-08-26: 7.5 mL via INTRAVENOUS

## 2022-09-05 DIAGNOSIS — D432 Neoplasm of uncertain behavior of brain, unspecified: Secondary | ICD-10-CM | POA: Diagnosis not present

## 2022-09-05 DIAGNOSIS — Z681 Body mass index (BMI) 19 or less, adult: Secondary | ICD-10-CM | POA: Diagnosis not present

## 2022-09-26 ENCOUNTER — Telehealth: Payer: Self-pay | Admitting: Student

## 2022-09-26 NOTE — Telephone Encounter (Signed)
Thanks, Gladys!

## 2022-09-26 NOTE — Telephone Encounter (Signed)
Wants to see if he can get samples of Jardiance 10 mg until he can get next week.

## 2022-09-26 NOTE — Telephone Encounter (Signed)
Pt states her just took his last pill for the following medication. Pt is asking for some samples for a week until he is able to pay the $112.00 for three months.  Pt states his pharmacy has approved his meds but he just needs another week to be able to pay the pharmacy the $112.00  Please call the patient back.  empagliflozin (JARDIANCE) 10 MG TABS table

## 2022-09-26 NOTE — Telephone Encounter (Signed)
RTC to patient .  Have a bottle of Jardiance for patient waiting for pick on tomorrow.

## 2022-09-27 NOTE — Telephone Encounter (Signed)
Patient her today to pick up sample of Jardiance 10 mg #30 Lot # [id number].  Given to patient per Dr. Posey Pronto.

## 2022-09-30 LAB — DIFFERENTIAL

## 2022-11-14 ENCOUNTER — Ambulatory Visit (INDEPENDENT_AMBULATORY_CARE_PROVIDER_SITE_OTHER): Payer: BC Managed Care – PPO | Admitting: Internal Medicine

## 2022-11-14 ENCOUNTER — Encounter: Payer: Self-pay | Admitting: Internal Medicine

## 2022-11-14 VITALS — BP 122/80 | HR 62 | Ht 72.0 in | Wt 135.0 lb

## 2022-11-14 DIAGNOSIS — E785 Hyperlipidemia, unspecified: Secondary | ICD-10-CM

## 2022-11-14 DIAGNOSIS — E119 Type 2 diabetes mellitus without complications: Secondary | ICD-10-CM | POA: Diagnosis not present

## 2022-11-14 LAB — POCT GLYCOSYLATED HEMOGLOBIN (HGB A1C): Hemoglobin A1C: 7 % — AB (ref 4.0–5.6)

## 2022-11-14 NOTE — Progress Notes (Signed)
Name: Anthony Henderson  MRN/ DOB: [id number], 10/05/1981   Age/ Sex: 41 y.o., male    PCP: Evlyn Kanner, MD   Reason for Endocrinology Evaluation: Type 2 Diabetes Mellitus     Date of Initial Endocrinology Visit: 03/04/2022    PATIENT IDENTIFIER: Anthony Henderson is a 41 y.o. male with a past medical history of PSVT, T2DM. The patient presented for initial endocrinology clinic visit on 03/04/2022 for consultative assistance with his diabetes management.    HPI: [name] was    Diagnosed with DM 2022 Prior Medications tried/Intolerance: He declines Metformin due to bad experience with family members. He was on Glipizide in 2022 but developed hypoglycemia.               Hemoglobin A1c has ranged from 5.9% in 2014, peaking at 7.1% in 2023.  On his initial visit to our clinic he had an A1c of 7.1% he was on Jardiance only which we continued  SUBJECTIVE:   During the last visit (06/27/2022): A1c 6.8 %   Today (11/14/22): Mr. [name] is here for follow-up on diabetes management.  He checks his blood sugars 2 times daily. The patient has not had hypoglycemic episodes since the last clinic visit  Denies nausea, vomiting or diarrhea    HOME DIABETES REGIMEN: Jardiance 10 mg daily Atorvastatin 20 mg daily    Statin: yes  ACE-I/ARB: no    METER DOWNLOAD:  unable to download   127- [phone]    DIABETIC COMPLICATIONS: Microvascular complications:   Denies: CKD, retinopathy, neuropathy  Last eye exam: Completed   Macrovascular complications:   Denies: CAD, PVD, CVA   PAST HISTORY: Past Medical History:  Past Medical History:  Diagnosis Date   Chronic leukopenia    Ganglioglioma of brain (HCC)    Hiatal hernia    SVT (supraventricular tachycardia)    s/p ablation 03-10-14 by Dr Ladona Ridgel   Past Surgical History:  Past Surgical History:  Procedure Laterality Date   ABLATION  03/10/2014   RFCA of left posterior septal accessory pathyway by Dr Ladona Ridgel with  resultant atrial fibrillation   SUPRAVENTRICULAR TACHYCARDIA ABLATION N/A 03/10/2014   Procedure: SUPRAVENTRICULAR TACHYCARDIA ABLATION;  Surgeon: Marinus Maw, MD;  Location: [facility] CATH LAB;  Service: Cardiovascular;  Laterality: N/A;    Social History:  reports that he quit smoking about 8 years ago. His smoking use included cigarettes. He has a 2.50 pack-year smoking history. He has never used smokeless tobacco. He reports that he does not currently use alcohol. He reports that he does not use drugs. Family History:  Family History  Problem Relation Age of Onset   Healthy Mother    Healthy Father    Diabetes Maternal Grandmother    Colon cancer Neg Hx    Esophageal cancer Neg Hx    Rectal cancer Neg Hx    Stomach cancer Neg Hx      HOME MEDICATIONS: Allergies as of 11/14/2022       Reactions   Penicillins Hives   Did it involve swelling of the face/tongue/throat, SOB, or low BP? No Did it involve sudden or severe rash/hives, skin peeling, or any reaction on the inside of your mouth or nose? Yes Did you need to seek medical attention at a hospital or doctor's office? No When did it last happen?      childhood If all above answers are "NO", may proceed with cephalosporin use.        Medication List  Accurate as of November 14, 2022  2:46 PM. If you have any questions, ask your nurse or doctor.          Accu-Chek Guide test strip Generic drug: glucose blood Check 3 times daily   atorvastatin 20 MG tablet Commonly known as: LIPITOR Take 1 tablet (20 mg total) by mouth daily.   busPIRone 5 MG tablet Commonly known as: BUSPAR Take 5 mg by mouth 2 (two) times daily.   empagliflozin 10 MG Tabs tablet Commonly known as: Jardiance Take 1 tablet (10 mg total) by mouth daily before breakfast.         ALLERGIES: Allergies  Allergen Reactions   Penicillins Hives    Did it involve swelling of the face/tongue/throat, SOB, or low BP? No Did it involve sudden  or severe rash/hives, skin peeling, or any reaction on the inside of your mouth or nose? Yes Did you need to seek medical attention at a hospital or doctor's office? No When did it last happen?      childhood If all above answers are "NO", may proceed with cephalosporin use.     REVIEW OF SYSTEMS: A comprehensive ROS was conducted with the patient and is negative except as per HPI     OBJECTIVE:   VITAL SIGNS: BP 122/80 (BP Location: Left Arm, Patient Position: Sitting, Cuff Size: Small)   Pulse 62   Ht 6' (1.829 m)   Wt 135 lb (61.2 kg)   SpO2 99%   BMI 18.31 kg/m    PHYSICAL EXAM:  General: Pt appears well and is in NAD  Neck: General: Supple without adenopathy or carotid bruits. Thyroid: Thyroid size normal.  No goiter or nodules appreciated.  Lungs: Clear with good BS bilat with no rales, rhonchi, or wheezes  Heart: RRR  Abdomen: Normoactive bowel sounds, soft, nontender, without masses or organomegaly palpable  Extremities:  Lower extremities - No pretibial edema.   Neuro: MS is good with appropriate affect, pt is alert and Ox3   DM Foot Exam 06/27/2022  The skin of the feet is intact without sores or ulcerations. The pedal pulses are 2+ on right and 2+ on left. The sensation is intact to a screening 5.07, 10 gram monofilament bilaterally   DATA REVIEWED:  Lab Results  Component Value Date   HGBA1C 7.0 (A) 11/14/2022   HGBA1C 6.8 (H) 06/14/2022   HGBA1C 7.1 (H) 02/24/2022      Latest Reference Range & Units 11/14/22 14:50  Sodium 135 - 145 mEq/L 138  Potassium 3.5 - 5.1 mEq/L 4.6  Chloride 96 - 112 mEq/L 102  CO2 19 - 32 mEq/L 28  Glucose 70 - 99 mg/dL 93  BUN 6 - 23 mg/dL 19  Creatinine 6.96 - 2.95 mg/dL 2.84  Calcium 8.4 - 13.2 mg/dL 44.0  GFR >10.27 mL/min 76.64    Latest Reference Range & Units 11/14/22 14:50  Total CHOL/HDL Ratio  2  Cholesterol 0 - 200 mg/dL 253 (H)  HDL Cholesterol >39.00 mg/dL 66.44  LDL (calc) 0 - 99 mg/dL 90  NonHDL   034.74  Triglycerides 0.0 - 149.0 mg/dL 25.9  VLDL 0.0 - 56.3 mg/dL 87.5    Latest Reference Range & Units 11/14/22 14:50  TSH 0.35 - 5.50 uIU/mL 1.82  T4,Free(Direct) 0.60 - 1.60 ng/dL 6.43   ASSESSMENT / PLAN / RECOMMENDATIONS:   1) Type 2 Diabetes Mellitus, optimally controlled, With out complications - Most recent A1c of 7.0%. Goal A1c <7.0%.    -A1c remains  at goal, but does admit to dietary indiscretion specially at night -No changes at this time -He had declined metformin use in the past due to negative experience 2 family members -He also does not want any medications that would cause weight loss, may consider Januvia if necessary in the future  -He was on glipizide at some point but this caused hypoglycemia  MEDICATIONS: Continue Jardiance 10 mg daily  EDUCATION / INSTRUCTIONS: BG monitoring instructions: Patient is instructed to check his blood sugars 3 times a week Call [facility] if: BG persistently < 70  I reviewed the Rule of 15 for the treatment of hypoglycemia in detail with the patient. Literature supplied.   2) Diabetic complications:  Eye: Does not have known diabetic retinopathy.  Neuro/ Feet: Does not have known diabetic peripheral neuropathy. Renal: Patient does not have known baseline CKD. He is not on an ACEI/ARB at present.   3) Dyslipidemia:   - His LDL was above goal, I switched pravastatin to atorvastatin in July 2023 -LDL at goal  Medication  Continue atorvastatin 20 mg daily  Follow-up in 6 months  Signed electronically by: Lyndle Herrlich, MD  [facility] Endocrinology  [facility] [address] [place], [place] [address] Phone: [phone] FAX: [phone]   CC: Evlyn Kanner, MD [address] [place] [address] Phone: [phone]  Fax: [phone]    Return to Endocrinology clinic as below: No future appointments.

## 2022-11-15 LAB — BASIC METABOLIC PANEL
BUN: 19 mg/dL (ref 6–23)
CO2: 28 mEq/L (ref 19–32)
Calcium: 10.1 mg/dL (ref 8.4–10.5)
Chloride: 102 mEq/L (ref 96–112)
Creatinine, Ser: 1.17 mg/dL (ref 0.40–1.50)
GFR: 76.64 mL/min (ref >60.00)
Glucose, Bld: 93 mg/dL (ref 70–99)
Potassium: 4.6 mEq/L (ref 3.5–5.1)
Sodium: 138 mEq/L (ref 135–145)

## 2022-11-15 LAB — LIPID PANEL
Cholesterol: 201 mg/dL — ABNORMAL HIGH (ref 0–200)
HDL: 93.2 mg/dL (ref >39.00)
LDL Cholesterol: 90 mg/dL (ref 0–99)
NonHDL: 107.58
Total CHOL/HDL Ratio: 2
Triglycerides: 87 mg/dL (ref 0.0–149.0)
VLDL: 17.4 mg/dL (ref 0.0–40.0)

## 2022-11-15 LAB — MICROALBUMIN / CREATININE URINE RATIO
Creatinine,U: 87.6 mg/dL
Microalb Creat Ratio: 0.8 mg/g (ref 0.0–30.0)
Microalb, Ur: <0.7 mg/dL (ref 0.0–1.9)

## 2022-11-15 LAB — TSH: TSH: 1.82 u[IU]/mL (ref 0.35–5.50)

## 2022-11-15 LAB — T4, FREE: Free T4: 0.84 ng/dL (ref 0.60–1.60)

## 2022-11-15 MED ORDER — ATORVASTATIN CALCIUM 20 MG PO TABS: 20.0000 mg | ORAL_TABLET | Freq: Every day | ORAL | 3 refills | Status: DC

## 2022-11-15 MED ORDER — EMPAGLIFLOZIN 10 MG PO TABS: 10.0000 mg | ORAL_TABLET | Freq: Every day | ORAL | 3 refills | Status: DC

## 2023-01-30 ENCOUNTER — Other Ambulatory Visit (HOSPITAL_COMMUNITY): Payer: Self-pay

## 2023-01-30 ENCOUNTER — Telehealth: Payer: Self-pay | Admitting: Student

## 2023-01-30 DIAGNOSIS — E119 Type 2 diabetes mellitus without complications: Secondary | ICD-10-CM

## 2023-01-30 MED ORDER — EMPAGLIFLOZIN 10 MG PO TABS
10.0000 mg | ORAL_TABLET | Freq: Every day | ORAL | 0 refills | Status: DC
  Filled 2023-01-30: qty 30, 30d supply, fill #0
  Filled 2023-02-24 (×2): qty 30, 30d supply, fill #1
  Filled 2023-03-26: qty 30, 30d supply, fill #2

## 2023-01-30 NOTE — Telephone Encounter (Signed)
Pt states he is in between jobs and wants to know if we have any samples of the following medication.   empagliflozin (JARDIANCE) 10 MG TABS tablet   [id number]- [place], [place] - [address] AT [address]

## 2023-01-30 NOTE — Telephone Encounter (Signed)
Our office does not have Jardiance 10 mg tabs samples. Pt stated he has a new job and his insurance has not kicked in. Pt stated he has 2 days left; and unable to afford w/o insurance. He wants to know what to do?  Can med to changed? Also sending to [place], Occupational psychologist, for any assistance. Thanks

## 2023-01-30 NOTE — Telephone Encounter (Signed)
I called [place] stated Jardiance 10 mg on the [facility] Program will cost $10 for a 30-day supply. I called pt - he stated this will "great"; he will like rx sent to [facility] pharmacy. Directions given to the pharmacy.

## 2023-02-24 ENCOUNTER — Other Ambulatory Visit (HOSPITAL_COMMUNITY): Payer: Self-pay

## 2023-03-27 ENCOUNTER — Other Ambulatory Visit (HOSPITAL_COMMUNITY): Payer: Self-pay

## 2023-04-05 ENCOUNTER — Other Ambulatory Visit: Payer: Self-pay | Admitting: Internal Medicine

## 2023-04-05 DIAGNOSIS — E1165 Type 2 diabetes mellitus with hyperglycemia: Secondary | ICD-10-CM

## 2023-04-06 ENCOUNTER — Telehealth: Payer: Self-pay

## 2023-04-10 ENCOUNTER — Other Ambulatory Visit: Payer: Self-pay | Admitting: Internal Medicine

## 2023-04-10 ENCOUNTER — Other Ambulatory Visit (HOSPITAL_COMMUNITY): Payer: Self-pay

## 2023-04-10 ENCOUNTER — Telehealth: Payer: Self-pay

## 2023-04-10 ENCOUNTER — Other Ambulatory Visit: Payer: Self-pay

## 2023-04-10 DIAGNOSIS — E119 Type 2 diabetes mellitus without complications: Secondary | ICD-10-CM

## 2023-04-10 MED ORDER — EMPAGLIFLOZIN 10 MG PO TABS: 10.0000 mg | ORAL_TABLET | Freq: Every day | ORAL | 0 refills | Status: DC

## 2023-04-10 MED ORDER — EMPAGLIFLOZIN 10 MG PO TABS
10.0000 mg | ORAL_TABLET | Freq: Every day | ORAL | 3 refills | Status: DC
  Filled 2023-04-10 – 2023-05-23 (×4): qty 30, 30d supply, fill #0

## 2023-04-10 NOTE — Telephone Encounter (Signed)
Patient Advocate Encounter   Received notification from pt msgs that prior authorization is required. Medication not specified. Test claims ran for both Jardiance and Accu-chek test strips  Per test claim: PA not required for 30 day supply of test strips. Copay $20.68  Per test claim:     They want him to try metformin before approving Jardiance  No PA submitted at this time

## 2023-04-10 NOTE — Telephone Encounter (Signed)
Patient wants to know if a formulary exception can be done because the Metformin will cause him stomach issues.

## 2023-04-10 NOTE — Addendum Note (Signed)
Addended by: Lisabeth Pick on: 04/10/2023 02:50 PM   Modules accepted: Orders

## 2023-04-10 NOTE — Telephone Encounter (Signed)
Patient Advocate Encounter   Received notification from pt msgs that prior authorization is required for Jardiance  Submitted: 04/10/23 Key [id number]  Status is pending

## 2023-04-10 NOTE — Telephone Encounter (Signed)
Samples of Jardiance placed up front for patient.

## 2023-04-10 NOTE — Telephone Encounter (Signed)
Patient needs PA. ?

## 2023-04-17 NOTE — Telephone Encounter (Signed)
Pharmacy Patient Advocate Encounter  Prior Authorization for [place] has been approved  Effective dates: 04/12/23 through 04/12/24

## 2023-04-17 NOTE — Telephone Encounter (Signed)
Patient advised.

## 2023-04-25 ENCOUNTER — Other Ambulatory Visit (HOSPITAL_COMMUNITY): Payer: Self-pay

## 2023-05-09 ENCOUNTER — Other Ambulatory Visit (HOSPITAL_COMMUNITY): Payer: Self-pay

## 2023-05-09 ENCOUNTER — Encounter: Payer: Self-pay | Admitting: *Deleted

## 2023-05-23 ENCOUNTER — Other Ambulatory Visit (HOSPITAL_COMMUNITY): Payer: Self-pay

## 2023-05-29 ENCOUNTER — Encounter: Payer: Self-pay | Admitting: Internal Medicine

## 2023-05-29 ENCOUNTER — Ambulatory Visit (INDEPENDENT_AMBULATORY_CARE_PROVIDER_SITE_OTHER): Payer: No Typology Code available for payment source | Admitting: Internal Medicine

## 2023-05-29 VITALS — BP 124/74 | HR 81 | Ht 72.0 in | Wt 134.0 lb

## 2023-05-29 DIAGNOSIS — E1165 Type 2 diabetes mellitus with hyperglycemia: Secondary | ICD-10-CM

## 2023-05-29 DIAGNOSIS — Z7984 Long term (current) use of oral hypoglycemic drugs: Secondary | ICD-10-CM

## 2023-05-29 DIAGNOSIS — E119 Type 2 diabetes mellitus without complications: Secondary | ICD-10-CM

## 2023-05-29 DIAGNOSIS — E785 Hyperlipidemia, unspecified: Secondary | ICD-10-CM | POA: Diagnosis not present

## 2023-05-29 LAB — POCT GLUCOSE (DEVICE FOR HOME USE): POC Glucose: 152 mg/dl — AB (ref 70–99)

## 2023-05-29 LAB — POCT GLYCOSYLATED HEMOGLOBIN (HGB A1C): Hemoglobin A1C: 7.6 % — AB (ref 4.0–5.6)

## 2023-05-29 MED ORDER — EMPAGLIFLOZIN 10 MG PO TABS: 10.0000 mg | ORAL_TABLET | Freq: Every day | ORAL | 3 refills | Status: DC

## 2023-05-29 MED ORDER — ATORVASTATIN CALCIUM 20 MG PO TABS: 20.0000 mg | ORAL_TABLET | Freq: Every day | ORAL | 3 refills | Status: DC

## 2023-05-29 MED ORDER — GLIMEPIRIDE 1 MG PO TABS
1.0000 mg | ORAL_TABLET | Freq: Every day | ORAL | 3 refills | Status: DC
Start: 2023-05-29 — End: 2023-12-18

## 2023-05-29 NOTE — Patient Instructions (Signed)
-   Continue Jardiance 10 mg daily  - Start Glimepiride 1 mg, 1 tablet before Breakfast      HOW TO TREAT LOW BLOOD SUGARS (Blood sugar LESS THAN 70 MG/DL) Please follow the RULE OF 15 for the treatment of hypoglycemia treatment (when your (blood sugars are less than 70 mg/dL)   STEP 1: Take 15 grams of carbohydrates when your blood sugar is low, which includes:  3-4 GLUCOSE TABS  OR 3-4 OZ OF JUICE OR REGULAR SODA OR ONE TUBE OF GLUCOSE GEL    STEP 2: RECHECK blood sugar in 15 MINUTES STEP 3: If your blood sugar is still low at the 15 minute recheck --> then, go back to STEP 1 and treat AGAIN with another 15 grams of carbohydrates.

## 2023-05-29 NOTE — Progress Notes (Signed)
Name: Anthony Henderson  MRN/ DOB: [id number], [birth year]   Age/ Sex: 42 y.o., male    PCP: Evlyn Kanner, MD   Reason for Endocrinology Evaluation: Type 2 Diabetes Mellitus     Date of Initial Endocrinology Visit: 03/04/2022    PATIENT IDENTIFIER: Anthony Henderson is a 42 y.o. male with a past medical history of PSVT, T2DM. The patient presented for initial endocrinology clinic visit on 03/04/2022 for consultative assistance with his diabetes management.    HPI: Anthony Henderson was    Diagnosed with DM 2022 Prior Medications tried/Intolerance: He declines Metformin due to bad experience with family members. He was on Glipizide in 2022 but developed hypoglycemia.               Hemoglobin A1c has ranged from 5.9% in 2014, peaking at 7.1% in 2023.  On his initial visit to our clinic he had an A1c of 7.1% he was on Jardiance only which we continued  Started Glimepiride 05/2023 with an A1c 7.6%     SUBJECTIVE:   During the last visit (11/14/2022): A1c 7.0 %      Today (05/29/23): Anthony Henderson is here for follow-up on diabetes management.  He checks his blood sugars 2 times daily. The patient has not had hypoglycemic episodes since the last clinic visit   Jardiance has become cost prohibitive on this current health insurance  Denies nausea, vomiting or diarrhea   Had transient episode of pins and needles of right foot   HOME DIABETES REGIMEN: Jardiance 10 mg daily Atorvastatin 20 mg daily    Statin: yes  ACE-I/ARB: no    METER DOWNLOAD:  n/a 150 - 240 mg/dL   DIABETIC COMPLICATIONS: Microvascular complications:   Denies: CKD, retinopathy, neuropathy  Last eye exam: Completed   Macrovascular complications:   Denies: CAD, PVD, CVA   PAST HISTORY: Past Medical History:  Past Medical History:  Diagnosis Date   Chronic leukopenia    Ganglioglioma of brain (HCC)    Hiatal hernia    SVT (supraventricular tachycardia)    s/p ablation 03-10-14 by Dr Ladona Ridgel    Past Surgical History:  Past Surgical History:  Procedure Laterality Date   ABLATION  03/10/2014   RFCA of left posterior septal accessory pathyway by Dr Ladona Ridgel with resultant atrial fibrillation   SUPRAVENTRICULAR TACHYCARDIA ABLATION N/A 03/10/2014   Procedure: SUPRAVENTRICULAR TACHYCARDIA ABLATION;  Surgeon: Marinus Maw, MD;  Location: [facility] CATH LAB;  Service: Cardiovascular;  Laterality: N/A;    Social History:  reports that he quit smoking about 9 years ago. His smoking use included cigarettes. He has a 2.50 pack-year smoking history. He has never used smokeless tobacco. He reports that he does not currently use alcohol. He reports that he does not use drugs. Family History:  Family History  Problem Relation Age of Onset   Healthy Mother    Healthy Father    Diabetes Maternal Grandmother    Colon cancer Neg Hx    Esophageal cancer Neg Hx    Rectal cancer Neg Hx    Stomach cancer Neg Hx      HOME MEDICATIONS: Allergies as of 05/29/2023       Reactions   Penicillins Hives   Did it involve swelling of the face/tongue/throat, SOB, or low BP? No Did it involve sudden or severe rash/hives, skin peeling, or any reaction on the inside of your mouth or nose? Yes Did you need to seek medical attention at a hospital or doctor's office?  No When did it last happen?      childhood If all above answers are "NO", may proceed with cephalosporin use.        Medication List        Accurate as of May 29, 2023  3:36 PM. If you have any questions, ask your nurse or doctor.          Accu-Chek Guide test strip Generic drug: glucose blood TEST ONCE DAILY. USE AS INSTRUCTED   atorvastatin 20 MG tablet Commonly known as: LIPITOR Take 1 tablet (20 mg total) by mouth daily.   busPIRone 5 MG tablet Commonly known as: BUSPAR Take 5 mg by mouth 2 (two) times daily.   empagliflozin 10 MG Tabs tablet Commonly known as: Jardiance Take 1 tablet (10 mg total) by mouth daily before  breakfast.   glimepiride 1 MG tablet Commonly known as: AMARYL Take 1 tablet (1 mg total) by mouth daily with breakfast. Started by: Scarlette Shorts, MD         ALLERGIES: Allergies  Allergen Reactions   Penicillins Hives    Did it involve swelling of the face/tongue/throat, SOB, or low BP? No Did it involve sudden or severe rash/hives, skin peeling, or any reaction on the inside of your mouth or nose? Yes Did you need to seek medical attention at a hospital or doctor's office? No When did it last happen?      childhood If all above answers are "NO", may proceed with cephalosporin use.     REVIEW OF SYSTEMS: A comprehensive ROS was conducted with the patient and is negative except as per HPI     OBJECTIVE:   VITAL SIGNS: BP 124/74 (BP Location: Left Arm, Patient Position: Sitting, Cuff Size: Small)   Pulse 81   Ht 6' (1.829 m)   Wt 134 lb (60.8 kg)   SpO2 97%   BMI 18.17 kg/m    PHYSICAL EXAM:  General: Pt appears well and is in NAD  Neck: General: Supple without adenopathy or carotid bruits. Thyroid: Thyroid size normal.  No goiter or nodules appreciated.  Lungs: Clear with good BS bilat with no rales, rhonchi, or wheezes  Heart: RRR  Abdomen: Normoactive bowel sounds, soft, nontender, without masses or organomegaly palpable  Extremities:  Lower extremities - No pretibial edema.   Neuro: MS is good with appropriate affect, pt is alert and Ox3   DM Foot Exam 05/29/2023  The skin of the feet is intact without sores or ulcerations. The pedal pulses are 2+ on right and 2+ on left. The sensation is intact to a screening 5.07, 10 gram monofilament bilaterally   DATA REVIEWED:  Lab Results  Component Value Date   HGBA1C 7.6 (A) 05/29/2023   HGBA1C 7.0 (A) 11/14/2022   HGBA1C 6.8 (H) 06/14/2022      Latest Reference Range & Units 11/14/22 14:50  Sodium 135 - 145 mEq/L 138  Potassium 3.5 - 5.1 mEq/L 4.6  Chloride 96 - 112 mEq/L 102  CO2 19 - 32 mEq/L  28  Glucose 70 - 99 mg/dL 93  BUN 6 - 23 mg/dL 19  Creatinine 1.61 - 0.96 mg/dL 0.45  Calcium 8.4 - 40.9 mg/dL 81.1  GFR >91.47 mL/min 76.64    Latest Reference Range & Units 11/14/22 14:50  Total CHOL/HDL Ratio  2  Cholesterol 0 - 200 mg/dL 829 (H)  HDL Cholesterol >39.00 mg/dL 56.21  LDL (calc) 0 - 99 mg/dL 90  NonHDL  308.65  Triglycerides 0.0 -  149.0 mg/dL 27.2  VLDL 0.0 - 53.6 mg/dL 64.4    Latest Reference Range & Units 11/14/22 14:50  TSH 0.35 - 5.50 uIU/mL 1.82  T4,Free(Direct) 0.60 - 1.60 ng/dL 0.34   ASSESSMENT / PLAN / RECOMMENDATIONS:   1) Type 2 Diabetes Mellitus, Sub- optimally controlled, With out complications - Most recent A1c of 7.6%. Goal A1c <7.0%.    -A1c increased which he attributes to dietary indiscretions  -He had declined metformin use in the past due to negative experience 2 family members -He also does not want any medications that would cause weight loss -He was on glipizide at some point but this caused hypoglycemia - We discussed trying Glimepiride, he was provided with coupon for Jardiance, if continues to be cost prohibitive, will stop   MEDICATIONS: Continue Jardiance 10 mg daily Start Glimepiride 1 mg daily   EDUCATION / INSTRUCTIONS: BG monitoring instructions: Patient is instructed to check his blood sugars 3 times a week Call [facility] if: BG persistently < 70  I reviewed the Rule of 15 for the treatment of hypoglycemia in detail with the patient. Literature supplied.   2) Diabetic complications:  Eye: Does not have known diabetic retinopathy.  Neuro/ Feet: Does not have known diabetic peripheral neuropathy. Renal: Patient does not have known baseline CKD. He is not on an ACEI/ARB at present.   3) Dyslipidemia:   - His LDL was above goal, I switched pravastatin to atorvastatin in July 2023 with improvement in his LDL   Medication  Continue atorvastatin 20 mg daily  Follow-up in 6 months  Signed  electronically by: Lyndle Herrlich, MD  [facility]  [facility] [address] [place], [place] [address] Phone: [phone] FAX: [phone]   CC: Evlyn Kanner, MD [address] [place] [address] Phone: [phone]  Fax: [phone]    Return to Endocrinology clinic as below: Future Appointments  Date Time Provider Department Center  12/18/2023  1:20 PM Andra Heslin, Konrad Dolores, MD [id number] None

## 2023-09-07 ENCOUNTER — Encounter: Payer: Self-pay | Admitting: Physician Assistant

## 2023-09-07 ENCOUNTER — Ambulatory Visit
Admission: EM | Admit: 2023-09-07 | Discharge: 2023-09-07 | Disposition: A | Discharge status: Home / Self Care | Payer: No Typology Code available for payment source | Attending: Physician Assistant | Admitting: Physician Assistant

## 2023-09-07 DIAGNOSIS — G4452 New daily persistent headache (NDPH): Secondary | ICD-10-CM | POA: Insufficient documentation

## 2023-09-07 DIAGNOSIS — M545 Low back pain, unspecified: Secondary | ICD-10-CM

## 2023-09-07 DIAGNOSIS — D432 Neoplasm of uncertain behavior of brain, unspecified: Secondary | ICD-10-CM | POA: Insufficient documentation

## 2023-09-07 MED ORDER — CYCLOBENZAPRINE HCL 10 MG PO TABS: 10.0000 mg | ORAL_TABLET | Freq: Two times a day (BID) | ORAL | 0 refills | Status: DC | PRN

## 2023-09-07 MED ORDER — PREDNISONE 20 MG PO TABS: 20.0000 mg | ORAL_TABLET | Freq: Every day | ORAL | 0 refills | Status: AC

## 2023-09-07 NOTE — ED Provider Notes (Signed)
[facility]    CSN: [id number] Arrival date & time: 09/07/23  1253      History   Chief Complaint Chief Complaint  Patient presents with   Back Pain    HPI Anthony Henderson is a 42 y.o. male.   Patient here today for evaluation of low back pain across his lower back that started a few weeks ago.  He states he thinks that he might of pulled something at work.  He is concerned because pain is continued.  He notes that once he is sitting pain seems to worsen.  He notes that while he is moving pain seems to be less severe.  He has tried topical treatment and over-the-counter medication without resolution.  Denies any numbness or weakness.  He has not had any loss of bowel or bladder function.  The history is provided by the patient.  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) 09/07/2023   Type 2 diabetes mellitus without complication, without long-term current use of insulin (HCC) 06/27/2022   Dyslipidemia 03/04/2022   Polyuria 02/25/2022   Ganglioglioma of brain (HCC) 02/25/2022   Chronic leukopenia 02/25/2022   Anxiety 09/16/2020   Diarrhea 09/16/2020   Fatigue 09/16/2020   Ganglioglioma 09/16/2020   Prediabetes 09/16/2020   Diabetes mellitus (HCC) 03/09/2020   Type II diabetes mellitus (HCC) 07/30/2019   Postoperative alteration in bowel elimination pattern 09/20/2018   Epigastric pain 03/24/2016   Heartburn 03/24/2016   Abdominal pain 10/21/2014   Nausea 10/21/2014   Gastroesophageal reflux disease 08/27/2014   PSVT (paroxysmal supraventricular tachycardia) (HCC) 02/07/2014   Paroxysmal  supraventricular tachycardia (HCC) 02/03/2014   Mixed anxiety and depressive disorder 05/10/2013    Past Surgical History:  Procedure Laterality Date   ABLATION  03/10/2014   RFCA of left posterior septal accessory pathyway by Dr Ladona Ridgel with resultant atrial fibrillation   SUPRAVENTRICULAR TACHYCARDIA ABLATION N/A 03/10/2014   Procedure: SUPRAVENTRICULAR TACHYCARDIA ABLATION;  Surgeon: Marinus Maw, MD;  Location: [facility] CATH LAB;  Service: Cardiovascular;  Laterality: N/A;       Home Medications    Prior to Admission medications   Medication Sig Start Date End Date Taking? Authorizing Provider  atorvastatin (LIPITOR) 20 MG tablet Take 1 tablet (20 mg total) by mouth daily. 05/29/23  Yes Shamleffer, Konrad Dolores, MD  cyclobenzaprine (FLEXERIL) 10 MG tablet Take 1 tablet (10 mg total) by mouth 2 (two) times daily as needed for muscle spasms. 09/07/23  Yes Tomi Bamberger, PA-C  glimepiride (AMARYL) 1 MG tablet Take 1 tablet (1 mg total) by mouth daily with breakfast. 05/29/23  Yes Shamleffer, Konrad Dolores, MD  predniSONE (DELTASONE) 20 MG tablet Take 1 tablet (20 mg total) by mouth daily with breakfast for 5 days. 09/07/23 09/12/23 Yes Tomi Bamberger, PA-C  glucose blood (ACCU-CHEK GUIDE) test strip TEST ONCE DAILY. USE AS INSTRUCTED 04/06/23   Shamleffer, Konrad Dolores, MD    Family History Family History  Problem Relation Age of Onset   Healthy Mother    Healthy Father    Diabetes Maternal  Grandmother    Colon cancer Neg Hx    Esophageal cancer Neg Hx    Rectal cancer Neg Hx    Stomach cancer Neg Hx     Social History Social History   Tobacco Use   Smoking status: Former    Current packs/day: 0.00    Average packs/day: 0.3 packs/day for 10.0 years (2.5 ttl pk-yrs)    Types: Cigarettes    Start date: 02/04/2004    Quit date: 02/03/2014    Years since quitting: 9.5   Smokeless tobacco: Never  Vaping Use   Vaping status: Never Used  Substance Use Topics   Alcohol use: Not  Currently    Alcohol/week: 0.0 standard drinks of alcohol    Comment: 03/10/2014 "might have a few drinks 1-2 times/yr, if that"   Drug use: No     Allergies   Penicillins   Review of Systems Review of Systems  Constitutional:  Negative for chills and fever.  Eyes:  Negative for discharge and redness.  Respiratory:  Negative for shortness of breath.   Musculoskeletal:  Positive for back pain.  Neurological:  Negative for numbness.     Physical Exam Triage Vital Signs ED Triage Vitals  Encounter Vitals Group     BP 09/07/23 1307 117/73     Systolic BP Percentile --      Diastolic BP Percentile --      Pulse Rate 09/07/23 1307 74     Resp 09/07/23 1307 16     Temp 09/07/23 1307 97.8 F (36.6 C)     Temp Source 09/07/23 1307 Oral     SpO2 09/07/23 1307 98 %     Weight 09/07/23 1306 140 lb (63.5 kg)     Height 09/07/23 1306 6' (1.829 m)     Head Circumference --      Peak Flow --      Pain Score 09/07/23 1302 7     Pain Loc --      Pain Education --      Exclude from Growth Chart --    No data found.  Updated Vital Signs BP 117/73 (BP Location: Left Arm)   Pulse 74   Temp 97.8 F (36.6 C) (Oral)   Resp 16   Ht 6' (1.829 m)   Wt 140 lb (63.5 kg)   SpO2 98%   BMI 18.99 kg/m     Physical Exam Vitals and nursing note reviewed.  Constitutional:      General: He is not in acute distress.    Appearance: Normal appearance. He is not ill-appearing.  HENT:     Head: Normocephalic and atraumatic.  Eyes:     Conjunctiva/sclera: Conjunctivae normal.  Cardiovascular:     Rate and Rhythm: Normal rate.  Pulmonary:     Effort: Pulmonary effort is normal. No respiratory distress.  Musculoskeletal:     Comments: No tenderness to palpation to midline spine.  Mild tenderness to palpation across low back.  Full range of motion of spine  Neurological:     Mental Status: He is alert.  Psychiatric:        Mood and Affect: Mood normal.        Behavior: Behavior normal.         Thought Content: Thought content normal.      UC Treatments / Results  Labs (all labs ordered are listed, but only abnormal results are displayed) Labs Reviewed - No data to display  EKG   Radiology No  results found.  Procedures Procedures (including critical care time)  Medications Ordered in UC Medications - No data to display  Initial Impression / Assessment and Plan / UC Course  I have reviewed the triage vital signs and the nursing notes.  Pertinent labs & imaging results that were available during my care of the patient were reviewed by me and considered in my medical decision making (see chart for details).    Steroid burst and muscle relaxer prescribed for low back pain.  Discussed that we will use a lower dose steroid given known diabetes.  Last A1c was 7.  Advised patient to monitor glucose levels.  Advised muscle relaxer may cause drowsiness.  Encouraged follow-up if no gradual improvement with any further concerns.  Final Clinical Impressions(s) / UC Diagnoses   Final diagnoses:  Acute bilateral low back pain without sciatica   Discharge Instructions   None    ED Prescriptions     Medication Sig Dispense Auth. Provider   predniSONE (DELTASONE) 20 MG tablet Take 1 tablet (20 mg total) by mouth daily with breakfast for 5 days. 5 tablet Erma Pinto F, PA-C   cyclobenzaprine (FLEXERIL) 10 MG tablet Take 1 tablet (10 mg total) by mouth 2 (two) times daily as needed for muscle spasms. 20 tablet Tomi Bamberger, PA-C      PDMP not reviewed this encounter.   Tomi Bamberger, PA-C 09/07/23 [phone]

## 2023-09-07 NOTE — ED Triage Notes (Signed)
"  Started Saturday before last from and injury on 08-24-2023, I tried to pull up something with my plyers and maybe didn't bend right, the pain hasn't gotten worse but not improving". "When I move the pain is better but when I sit the pain is worse". Pain is located "Pain is around my pant line, all the way across".

## 2023-10-02 ENCOUNTER — Ambulatory Visit (INDEPENDENT_AMBULATORY_CARE_PROVIDER_SITE_OTHER): Payer: No Typology Code available for payment source | Admitting: Student

## 2023-10-02 ENCOUNTER — Encounter: Payer: Self-pay | Admitting: Student

## 2023-10-02 ENCOUNTER — Other Ambulatory Visit (HOSPITAL_COMMUNITY): Payer: Self-pay

## 2023-10-02 VITALS — BP 107/71 | HR 64 | Temp 97.7°F | Wt 136.4 lb

## 2023-10-02 DIAGNOSIS — M545 Low back pain, unspecified: Secondary | ICD-10-CM | POA: Diagnosis not present

## 2023-10-02 DIAGNOSIS — E119 Type 2 diabetes mellitus without complications: Secondary | ICD-10-CM | POA: Diagnosis not present

## 2023-10-02 DIAGNOSIS — Z7984 Long term (current) use of oral hypoglycemic drugs: Secondary | ICD-10-CM

## 2023-10-02 DIAGNOSIS — M549 Dorsalgia, unspecified: Secondary | ICD-10-CM | POA: Insufficient documentation

## 2023-10-02 LAB — POCT GLYCOSYLATED HEMOGLOBIN (HGB A1C): Hemoglobin A1C: 8.2 % — AB (ref 4.0–5.6)

## 2023-10-02 LAB — GLUCOSE, CAPILLARY: Glucose-Capillary: 149 mg/dL — ABNORMAL HIGH (ref 70–99)

## 2023-10-02 NOTE — Assessment & Plan Note (Signed)
Patient seen at urgent care 10/03 for acute lower back pain that potentially started after pulling muscle/back at work. No imaging was done as patient did not have red flag symptoms for incontinence, numbness, or weakness. Patient was given prednisone 20 mg for 5 days and flexeril 10 mg BID as needed for muscle spasms. Patient reports pain almost completely resolved last week, may be 1/10 now and the pain comes and goes. Completed 3/5 days of prednisone due to his concerns for how it may affect his glucose. He took about 3 doses of the flexeril 10 mg. Has been using Tylenol as needed for pain, which has been effective. Patient denies any pain on physical exam today. Patient initially concerned the prednisone and flexeril may have impacted his kidneys. Reassured patient that the medications he took for a short course most likely did not impact his kidney function. Patient denied any dysuria, hematuria, fever, nausea, vomiting, suprapubic pain or flank pain. Patient did endorse dark urine but also reports limited water intake throughout the day. Overall back pain seems to be resolving with conservative management.  - Patient can continue to take Tylenol 500-650 mg q6H as needed for mild pain - Patient can call office if pain quality changes or worsens

## 2023-10-02 NOTE — Patient Instructions (Addendum)
Thank you, Mr.Jaquell G Muha for allowing [place] to provide your care today. Today we discussed your back pain and diabetes regimen.   I have ordered the following labs for you:  Lab Orders         BMP8+Anion Gap         Microalbumin / Creatinine Urine Ratio         POC Hbg A1C      Tests ordered today:  - none  Referrals ordered today:   Referral Orders  No referral(s) requested today     I have ordered the following medication/changed the following medications:   Stop the following medications: There are no discontinued medications.   Start the following medications: No orders of the defined types were placed in this encounter.    Follow up: 3 months    Remember:   - For your diabetes: Please continue to follow up with Dr. Shamleffer/Endocrinology; continue to take glimepiride 1 mg daily with breakfast and checking your glucose levels. I will reach out to Estanislado Spire, our pharmacy tech, to determine if there are alternatives to Jardiance and accu-chek strips with your current insurance.   - Please remember to stay hydrated throughout the day for good kidney health.   - For your back pain: please continue to take Tylenol as needed for mild pain. If your back pain quality changes or worsens, please let us know.   - I will call you to discuss your other lab results this week and any next steps as needed.   Should you have any questions or concerns please call the internal medicine clinic at [phone].     Olar Santini Colbert Coyer, MD PGY-1 Internal Medicine Teaching Progam [facility]

## 2023-10-02 NOTE — Progress Notes (Signed)
Established Patient Office Visit  Subjective   Patient ID: Anthony Henderson, male    DOB: 21-May-1981  Age: 42 y.o. MRN: [id number]  Chief Complaint  Patient presents with   Back Pain    Injured back over mth ago, seen at urgent care, pain has improved Pt requesting "check my kidneys"     Patient is a 42 yo with a past medical history stated below who presents today for follow-up for back pain and diabetes management. Please see problem based assessment and plan for additional details.     Past Medical History:  Diagnosis Date   Chronic leukopenia    Ganglioglioma of brain (HCC)    Hiatal hernia    SVT (supraventricular tachycardia) (HCC)    s/p ablation 03-10-14 by Dr Ladona Ridgel   Review of Systems  Constitutional:  Negative for chills and fever.  Cardiovascular:  Negative for leg swelling.  Gastrointestinal:  Negative for abdominal pain.  Genitourinary:  Negative for dysuria, flank pain and urgency.  Musculoskeletal:        Very mild lower back pain (1/10).       Objective:     BP 107/71 (BP Location: Right Arm, Patient Position: Sitting, Cuff Size: Normal)   Pulse 64   Temp 97.7 F (36.5 C) (Oral)   Wt 136 lb 6.4 oz (61.9 kg)   SpO2 100%   BMI 18.50 kg/m  BP Readings from Last 3 Encounters:  10/02/23 107/71  09/07/23 117/73  05/29/23 124/74   Wt Readings from Last 3 Encounters:  10/02/23 136 lb 6.4 oz (61.9 kg)  09/07/23 140 lb (63.5 kg)  05/29/23 134 lb (60.8 kg)    Physical Exam HENT:     Head: Normocephalic and atraumatic.     Nose: Nose normal.     Mouth/Throat:     Mouth: Mucous membranes are moist.  Cardiovascular:     Rate and Rhythm: Normal rate and regular rhythm.     Pulses: Normal pulses.     Heart sounds: Normal heart sounds.  Pulmonary:     Effort: Pulmonary effort is normal.  Abdominal:     General: Bowel sounds are normal.     Palpations: Abdomen is soft.     Tenderness: There is no abdominal tenderness. There is no right CVA  tenderness or left CVA tenderness.  Musculoskeletal:        General: No swelling.  Neurological:     General: No focal deficit present.     Mental Status: He is alert.  Psychiatric:        Mood and Affect: Mood normal.        Behavior: Behavior normal.    Results for orders placed or performed in visit on 10/02/23  Glucose, capillary  Result Value Ref Range   Glucose-Capillary 149 (H) 70 - 99 mg/dL  POC Hbg M0N  Result Value Ref Range   Hemoglobin A1C 8.2 (A) 4.0 - 5.6 %   HbA1c POC (<> result, manual entry)     HbA1c, POC (prediabetic range)     HbA1c, POC (controlled diabetic range)      Last hemoglobin A1c Lab Results  Component Value Date   HGBA1C 8.2 (A) 10/02/2023     The 10-year ASCVD risk score (Arnett DK, et al., 2019) is: 4.2%    Assessment & Plan:   Problem List Items Addressed This Visit       Endocrine   Type 2 diabetes mellitus without complication, without long-term current use  of insulin (HCC) - Primary (Chronic)    Patient is currently taking glimepiride 1 mg daily with breakfast, tolerating well. Was taking empagliflozin/jardiance 10 mg daily until his new insurance plan made the monthly copay too expensive. He follows with Dr. Shamleffer/endocrinology, last seen 05/29/23 with an upcoming appointment in December. Patient states his fasting glucose levels have been around 150s, endorses one low glucose level of ~90 (ate, recovered), and one >300 right after starting prednisone for his back pain. Hgb A1c today 8.2% compared to 7.6% from 6/24. Discussed reaching out to pharmacy at [facility] to help determine alternatives to jardiance while the patient looks into changing insurance coverage as monotherapy with glimepiride is not as effective for his diabetes management.  - Continue glimepiride 1 mg daily  - Reach out to [facility] [place]/pharmacy regarding jardiance alternatives  - Patient to follow up with endocrinology in December - Will get BMP and urine  microalbumin/creatinine today, follow up as needed - Provided patient with discount card for Accu-Chek strips       Relevant Orders   BMP8+Anion Gap   Microalbumin / Creatinine Urine Ratio   POC Hbg A1C (Completed)     Other   Back pain    Patient seen at urgent care 10/03 for acute lower back pain that potentially started after pulling muscle/back at work. No imaging was done as patient did not have red flag symptoms for incontinence, numbness, or weakness. Patient was given prednisone 20 mg for 5 days and flexeril 10 mg BID as needed for muscle spasms. Patient reports pain almost completely resolved last week, may be 1/10 now and the pain comes and goes. Completed 3/5 days of prednisone due to his concerns for how it may affect his glucose. He took about 3 doses of the flexeril 10 mg. Has been using Tylenol as needed for pain, which has been effective. Patient denies any pain on physical exam today. Patient initially concerned the prednisone and flexeril may have impacted his kidneys. Reassured patient that the medications he took for a short course most likely did not impact his kidney function. Patient denied any dysuria, hematuria, fever, nausea, vomiting, suprapubic pain or flank pain. Patient did endorse dark urine but also reports limited water intake throughout the day. Overall back pain seems to be resolving with conservative management.  - Patient can continue to take Tylenol 500-650 mg q6H as needed for mild pain - Patient can call office if pain quality changes or worsens      Return in about 3 months (around 01/02/2024) for Diabetes follow up, medication follow up after insurance changes.   Patient seen with Dr. Antony Contras.   Anthony Brodersen Colbert Coyer, MD

## 2023-10-02 NOTE — Assessment & Plan Note (Addendum)
Patient is currently taking glimepiride 1 mg daily with breakfast, tolerating well. Was taking empagliflozin/jardiance 10 mg daily until his new insurance plan made the monthly copay too expensive. He follows with Dr. Shamleffer/endocrinology, last seen 05/29/23 with an upcoming appointment in December. Patient states his fasting glucose levels have been around 150s, endorses one low glucose level of ~90 (ate, recovered), and one >300 right after starting prednisone for his back pain. Hgb A1c today 8.2% compared to 7.6% from 6/24. Discussed reaching out to pharmacy at [facility] to help determine alternatives to jardiance while the patient looks into changing insurance coverage as monotherapy with glimepiride is not as effective for his diabetes management.  - Continue glimepiride 1 mg daily  - Reach out to [facility] [place]/pharmacy regarding jardiance alternatives  - Patient to follow up with endocrinology in December - Will get BMP and urine microalbumin/creatinine today, follow up as needed - Provided patient with discount card for Accu-Chek strips

## 2023-10-03 LAB — BMP8+ANION GAP
Anion Gap: 15 mmol/L (ref 10.0–18.0)
BUN/Creatinine Ratio: 14 (ref 9–20)
BUN: 14 mg/dL (ref 6–24)
CO2: 23 mmol/L (ref 20–29)
Calcium: 9.4 mg/dL (ref 8.7–10.2)
Chloride: 100 mmol/L (ref 96–106)
Creatinine, Ser: 1.03 mg/dL (ref 0.76–1.27)
Glucose: 151 mg/dL — ABNORMAL HIGH (ref 70–99)
Potassium: 4.7 mmol/L (ref 3.5–5.2)
Sodium: 138 mmol/L (ref 134–144)
eGFR: 92 mL/min/{1.73_m2} (ref >59)

## 2023-10-03 LAB — MICROALBUMIN / CREATININE URINE RATIO
Creatinine, Urine: 128.9 mg/dL
Microalb/Creat Ratio: 4 mg/g{creat} (ref 0–29)
Microalbumin, Urine: 5.2 ug/mL

## 2023-10-06 NOTE — Progress Notes (Signed)
Internal Medicine Clinic Attending  Case discussed with the resident at the time of the visit.  We reviewed the resident's history and exam and pertinent patient test results.  I agree with the assessment, diagnosis, and plan of care documented in the resident's note.  

## 2023-10-25 ENCOUNTER — Encounter: Payer: Self-pay | Admitting: Student

## 2023-12-18 ENCOUNTER — Encounter: Payer: Self-pay | Admitting: Internal Medicine

## 2023-12-18 ENCOUNTER — Ambulatory Visit (INDEPENDENT_AMBULATORY_CARE_PROVIDER_SITE_OTHER): Payer: No Typology Code available for payment source | Admitting: Internal Medicine

## 2023-12-18 VITALS — BP 112/74 | HR 71 | Ht 72.0 in | Wt 139.0 lb

## 2023-12-18 DIAGNOSIS — Z7984 Long term (current) use of oral hypoglycemic drugs: Secondary | ICD-10-CM | POA: Diagnosis not present

## 2023-12-18 DIAGNOSIS — E1165 Type 2 diabetes mellitus with hyperglycemia: Secondary | ICD-10-CM | POA: Diagnosis not present

## 2023-12-18 DIAGNOSIS — E785 Hyperlipidemia, unspecified: Secondary | ICD-10-CM

## 2023-12-18 LAB — POCT GLUCOSE (DEVICE FOR HOME USE): POC Glucose: 150 mg/dL — AB (ref 70–99)

## 2023-12-18 MED ORDER — GLIMEPIRIDE 2 MG PO TABS: 2.0000 mg | ORAL_TABLET | Freq: Every day | ORAL | 3 refills | Status: DC

## 2023-12-18 NOTE — Progress Notes (Signed)
 Name: Anthony Henderson  MRN/ DOB: [id number], 11-Jun-1981   Age/ Sex: 43 y.o., male    PCP: Arellano Zameza, Priscila, MD   Reason for Endocrinology Evaluation: Type 2 Diabetes Mellitus     Date of Initial Endocrinology Visit: 03/04/2022    PATIENT IDENTIFIER: [name] is a 43 y.o. male with a past medical history of PSVT, T2DM. The patient presented for initial endocrinology clinic visit on 03/04/2022 for consultative assistance with his diabetes management.    HPI: Anthony Henderson was    Diagnosed with DM 2022 Prior Medications tried/Intolerance: He declines Metformin due to bad experience with family members. He was on Glipizide in 2022 but developed hypoglycemia.               Hemoglobin A1c has ranged from 5.9% in 2014, peaking at 7.1% in 2023.  On his initial visit to our clinic he had an A1c of 7.1% he was on Jardiance  only which we continued  Started Glimepiride  05/2023 with an A1c 7.6%     SUBJECTIVE:   During the last visit (05/29/2023): A1c 7.6 %      Today (12/18/23): [name] is here for follow-up on diabetes management.  He checks his blood sugars 2-3 times daily. The patient has not had hypoglycemic episodes since the last clinic visit   Has had rare episodes with nausea and vomiting that he attributes to hyperglycemia Denies constipation or diarrhea  Has noted brain fog during the day that is not attributed to glucose     HOME DIABETES REGIMEN: Glimepiride  1 mg daily Atorvastatin  20 mg daily    Statin: yes  ACE-I/ARB: no    METER DOWNLOAD:  n/a    DIABETIC COMPLICATIONS: Microvascular complications:   Denies: CKD, retinopathy, neuropathy  Last eye exam: Completed   Macrovascular complications:   Denies: CAD, PVD, CVA   PAST HISTORY: Past Medical History:  Past Medical History:  Diagnosis Date   Chronic leukopenia    Ganglioglioma of brain (HCC)    Hiatal hernia    SVT (supraventricular tachycardia) (HCC)    s/p  ablation 03-10-14 by Dr Waddell   Past Surgical History:  Past Surgical History:  Procedure Laterality Date   ABLATION  03/10/2014   RFCA of left posterior septal accessory pathyway by Dr Waddell with resultant atrial fibrillation   SUPRAVENTRICULAR TACHYCARDIA ABLATION N/A 03/10/2014   Procedure: SUPRAVENTRICULAR TACHYCARDIA ABLATION;  Surgeon: Danelle LELON Waddell, MD;  Location: [facility] CATH LAB;  Service: Cardiovascular;  Laterality: N/A;    Social History:  reports that he quit smoking about 9 years ago. His smoking use included cigarettes. He started smoking about 19 years ago. He has a 2.5 pack-year smoking history. He has never used smokeless tobacco. He reports that he does not currently use alcohol. He reports that he does not use drugs. Family History:  Family History  Problem Relation Age of Onset   Healthy Mother    Healthy Father    Diabetes Maternal Grandmother    Colon cancer Neg Hx    Esophageal cancer Neg Hx    Rectal cancer Neg Hx    Stomach cancer Neg Hx      HOME MEDICATIONS: Allergies as of 12/18/2023       Reactions   Penicillins Hives   Did it involve swelling of the face/tongue/throat, SOB, or low BP? No Did it involve sudden or severe rash/hives, skin peeling, or any reaction on the inside of your mouth or nose? Yes Did you  need to seek medical attention at a hospital or doctor's office? No When did it last happen?      childhood If all above answers are "NO", may proceed with cephalosporin use.        Medication List        Accurate as of December 18, 2023  1:20 PM. If you have any questions, ask your nurse or doctor.          Accu-Chek Guide test strip Generic drug: glucose blood TEST ONCE DAILY. USE AS INSTRUCTED   atorvastatin  20 MG tablet Commonly known as: LIPITOR Take 1 tablet (20 mg total) by mouth daily.   glimepiride  1 MG tablet Commonly known as: AMARYL  Take 1 tablet (1 mg total) by mouth daily with breakfast.          ALLERGIES: Allergies  Allergen Reactions   Penicillins Hives    Did it involve swelling of the face/tongue/throat, SOB, or low BP? No Did it involve sudden or severe rash/hives, skin peeling, or any reaction on the inside of your mouth or nose? Yes Did you need to seek medical attention at a hospital or doctor's office? No When did it last happen?      childhood If all above answers are "NO", may proceed with cephalosporin use.     REVIEW OF SYSTEMS: A comprehensive ROS was conducted with the patient and is negative except as per HPI     OBJECTIVE:   VITAL SIGNS: BP 112/74 (BP Location: Left Arm, Patient Position: Sitting, Cuff Size: Normal)   Pulse 71   Ht 6' (1.829 m)   Wt 139 lb (63 kg)   SpO2 99%   BMI 18.85 kg/m    PHYSICAL EXAM:  General: Pt appears well and is in NAD  Lungs: Clear with good BS bilat   Heart: RRR  Extremities:  Lower extremities - No pretibial edema.   Neuro: MS is good with appropriate affect, pt is alert and Ox3   DM Foot Exam 05/29/2023  The skin of the feet is intact without sores or ulcerations. The pedal pulses are 2+ on right and 2+ on left. The sensation is intact to a screening 5.07, 10 gram monofilament bilaterally   DATA REVIEWED:  Lab Results  Component Value Date   HGBA1C 8.2 (A) 10/02/2023   HGBA1C 7.6 (A) 05/29/2023   HGBA1C 7.0 (A) 11/14/2022    Latest Reference Range & Units 12/18/23 13:47  TSH 0.40 - 4.50 mIU/L 1.22  T4,Free(Direct) 0.8 - 1.8 ng/dL 1.0    Latest Reference Range & Units 12/18/23 13:47  Microalb, Ur mg/dL <9.7  MICROALB/CREAT RATIO <30 mg/g creat NOTE  Creatinine, Urine 20 - 320 mg/dL 870    ASSESSMENT / PLAN / RECOMMENDATIONS:   1) Type 2 Diabetes Mellitus, Sub- optimally controlled, With out complications - Most recent A1c of 7.3%. Goal A1c <7.0%.     -A1c is improving but continues to be above goal -He had declined metformin use in the past due to negative experience 2 family  members -He also does not want any medications that would cause weight loss -Jardiance  was cost prohibitive -Fasting BG per Patient 140-150 Mg/dL  MEDICATIONS:  Increase Glimepiride  2 mg daily   EDUCATION / INSTRUCTIONS: BG monitoring instructions: Patient is instructed to check his blood sugars 3 times a week Call [place] if: BG persistently < 70  I reviewed the Rule of 15 for the treatment of hypoglycemia in detail with the patient. Literature supplied.  2) Diabetic complications:  Eye: Does not have known diabetic retinopathy.  Neuro/ Feet: Does not have known diabetic peripheral neuropathy. Renal: Patient does not have known baseline CKD. He is not on an ACEI/ARB at present.   3) Dyslipidemia:   - His LDL was above goal, I switched pravastatin  to atorvastatin  in July 2023 with improvement in his LDL  -Patient advised to take this at bedtime  Medication  Continue atorvastatin  20 mg daily  4) Brain fog:  -Discussed thyroid , versus ADHD, versus anxiety -TFTs normal  Follow-up in 6 months  Signed electronically by: Stefano Redgie Butts, MD  [facility] Endocr[facility]  [facility] [address] [place] [place], [place] [address] Phone: [phone] FAX: [phone]   CC: Carlotta Consepcion Felix, MD [address] [place] [address] Phone: [phone]  Fax: [phone]    Return to Endocrinology clinic as below: Future Appointments  Date Time Provider Department Center  01/04/2024  9:45 AM Carlotta Consepcion Felix, MD [facility]

## 2023-12-18 NOTE — Patient Instructions (Signed)
-   Increase Glimepiride  2 mg, 1 tablet before Breakfast      HOW TO TREAT LOW BLOOD SUGARS (Blood sugar LESS THAN 70 MG/DL) Please follow the RULE OF 15 for the treatment of hypoglycemia treatment (when your (blood sugars are less than 70 mg/dL)   STEP 1: Take 15 grams of carbohydrates when your blood sugar is low, which includes:  3-4 GLUCOSE TABS  OR 3-4 OZ OF JUICE OR REGULAR SODA OR ONE TUBE OF GLUCOSE GEL    STEP 2: RECHECK blood sugar in 15 MINUTES STEP 3: If your blood sugar is still low at the 15 minute recheck --> then, go back to STEP 1 and treat AGAIN with another 15 grams of carbohydrates.

## 2023-12-19 LAB — T4, FREE: Free T4: 1 ng/dL (ref 0.8–1.8)

## 2023-12-19 LAB — MICROALBUMIN / CREATININE URINE RATIO
Creatinine, Urine: 129 mg/dL (ref 20–320)
Microalb, Ur: <0.2 mg/dL

## 2023-12-19 LAB — TSH: TSH: 1.22 m[IU]/L (ref 0.40–4.50)

## 2023-12-20 LAB — POCT GLYCOSYLATED HEMOGLOBIN (HGB A1C): Hemoglobin A1C: 7.3 % — AB (ref 4.0–5.6)

## 2023-12-26 ENCOUNTER — Encounter: Payer: Self-pay | Admitting: Emergency Medicine

## 2023-12-26 ENCOUNTER — Ambulatory Visit
Admission: EM | Admit: 2023-12-26 | Discharge: 2023-12-26 | Disposition: A | Discharge status: Home / Self Care | Payer: No Typology Code available for payment source | Attending: Family Medicine | Admitting: Family Medicine

## 2023-12-26 ENCOUNTER — Other Ambulatory Visit: Payer: Self-pay

## 2023-12-26 DIAGNOSIS — M25532 Pain in left wrist: Secondary | ICD-10-CM

## 2023-12-26 MED ORDER — HYDROCODONE-ACETAMINOPHEN 5-325 MG PO TABS: 1.0000 | ORAL_TABLET | Freq: Four times a day (QID) | ORAL | 0 refills | Status: AC | PRN

## 2023-12-26 NOTE — Discharge Instructions (Addendum)
Call us tomorrow or Thursday to see if we have x-ray available.  Be aware, you have been prescribed pain medications that may cause drowsiness. While taking this medication, do not take any other medications containing acetaminophen (Tylenol). Do not combine with alcohol or recreational drugs. Please do not drive, operate heavy machinery, or take part in activities that require making important decisions while on this medication as your judgement may be clouded.

## 2023-12-26 NOTE — ED Provider Notes (Signed)
[facility]   [id number] 12/26/23 Arrival Time: 1920  ASSESSMENT & PLAN:  1. Left wrist pain    No x-ray available here this evening. He will call us tomorrow or Thursday to see if we have; may return for imaging of L wrist.  New Prescriptions   HYDROCODONE-ACETAMINOPHEN (NORCO/VICODIN) 5-325 MG TABLET    Take 1 tablet by mouth every 6 (six) hours as needed for moderate pain (pain score 4-6) or severe pain (pain score 7-10).    Orders Placed This Encounter  Procedures   Apply Wrist brace    [place] Controlled Substances Registry consulted for this patient. I feel the risk/benefit ratio today is favorable for proceeding with this prescription for a controlled substance. Medication sedation precautions given.  Reviewed expectations re: course of current medical issues. Questions answered. Outlined signs and symptoms indicating need for more acute intervention. Patient verbalized understanding. After Visit Summary given.  SUBJECTIVE: History from: patient. Anthony Henderson is a 43 y.o. male who reports L radial wrist pain; today; hammer vs wrist. Denies extremity sensation changes or weakness. No tx PTA.  Past Surgical History:  Procedure Laterality Date   ABLATION  03/10/2014   RFCA of left posterior septal accessory pathyway by Dr Ladona Ridgel with resultant atrial fibrillation   SUPRAVENTRICULAR TACHYCARDIA ABLATION N/A 03/10/2014   Procedure: SUPRAVENTRICULAR TACHYCARDIA ABLATION;  Surgeon: Marinus Maw, MD;  Location: [facility] CATH LAB;  Service: Cardiovascular;  Laterality: N/A;      OBJECTIVE:  Vitals:   12/26/23 1933  BP: 129/82  Pulse: 78  Resp: 18  Temp: (!) 97.3 F (36.3 C)  TempSrc: Oral  SpO2: 99%    Extremities: LUE: warm with well perfused appearance; fairly well localized marked tenderness over left radial wrist; without gross deformities; swelling: minimal; bruising: none; wrist ROM: normal, with discomfort CV: brisk extremity capillary refill of LUE; 2+  radial pulse of LUE. Skin: warm and dry; no visible rashes Neurologic: normal sensation and strength of LUE Psychological: alert and cooperative; normal mood and affect     Allergies  Allergen Reactions   Penicillins Hives    Did it involve swelling of the face/tongue/throat, SOB, or low BP? No Did it involve sudden or severe rash/hives, skin peeling, or any reaction on the inside of your mouth or nose? Yes Did you need to seek medical attention at a hospital or doctor's office? No When did it last happen?      childhood If all above answers are "NO", may proceed with cephalosporin use.    Past Medical History:  Diagnosis Date   Chronic leukopenia    Ganglioglioma of brain (HCC)    Hiatal hernia    SVT (supraventricular tachycardia) (HCC)    s/p ablation 03-10-14 by Dr Ladona Ridgel   Social History   Socioeconomic History   Marital status: Legally Separated    Spouse name: Judeth Cornfield   Number of children: 3   Years of education: Not on file   Highest education level: High school graduate  Occupational History   Occupation: Time Secondary school teacher  Tobacco Use   Smoking status: Former    Current packs/day: 0.00    Average packs/day: 0.3 packs/day for 10.0 years (2.5 ttl pk-yrs)    Types: Cigarettes    Start date: 02/04/2004    Quit date: 02/03/2014    Years since quitting: 9.8   Smokeless tobacco: Never  Vaping Use   Vaping status: Never Used  Substance and Sexual Activity   Alcohol use: Not  Currently    Alcohol/week: 0.0 standard drinks of alcohol    Comment: 03/10/2014 "might have a few drinks 1-2 times/yr, if that"   Drug use: No   Sexual activity: Yes  Other Topics Concern   Not on file  Social History Narrative   Lives with wife   No caffeine   Social Drivers of Corporate investment banker Strain: Not on file  Food Insecurity: Not on file  Transportation Needs: Not on file  Physical Activity: Not on file  Stress: Not on file  Social Connections: Not on file    Family History  Problem Relation Age of Onset   Healthy Mother    Healthy Father    Diabetes Maternal Grandmother    Colon cancer Neg Hx    Esophageal cancer Neg Hx    Rectal cancer Neg Hx    Stomach cancer Neg Hx    Past Surgical History:  Procedure Laterality Date   ABLATION  03/10/2014   RFCA of left posterior septal accessory pathyway by Dr Ladona Ridgel with resultant atrial fibrillation   SUPRAVENTRICULAR TACHYCARDIA ABLATION N/A 03/10/2014   Procedure: SUPRAVENTRICULAR TACHYCARDIA ABLATION;  Surgeon: Marinus Maw, MD;  Location: [facility] CATH LAB;  Service: Cardiovascular;  Laterality: N/A;       Mardella Layman, MD 12/26/23 1949

## 2023-12-26 NOTE — ED Triage Notes (Signed)
Pt here for left wrist pain after striking self with hammer earlier today; pt sts initially not that painful but became more painful through out the day

## 2024-01-04 ENCOUNTER — Encounter: Payer: No Typology Code available for payment source | Admitting: Student

## 2024-01-08 ENCOUNTER — Encounter: Payer: No Typology Code available for payment source | Admitting: Student

## 2024-03-25 ENCOUNTER — Encounter: Payer: No Typology Code available for payment source | Admitting: Student

## 2024-04-01 ENCOUNTER — Encounter: Payer: Self-pay | Admitting: Internal Medicine

## 2024-04-01 ENCOUNTER — Telehealth: Payer: Self-pay | Admitting: Student

## 2024-04-01 NOTE — Telephone Encounter (Signed)
 Patient is calling to see if he needs to have kidney function testing redone - he has received letter in the mail and has not heard from office.    Copied from CRM [id number]. Topic: Clinical - Lab/Test Results >> Apr 01, 2024 12:01 PM Tisa Forester wrote: Reason for CRM: patient calling ask if someone can go over test result in my chart On 02/12/24  it was a software function he had the UACR test done in January this year  Patient call back [phone]

## 2024-04-09 ENCOUNTER — Other Ambulatory Visit: Payer: Self-pay | Admitting: Internal Medicine

## 2024-04-09 DIAGNOSIS — E1165 Type 2 diabetes mellitus with hyperglycemia: Secondary | ICD-10-CM

## 2024-04-22 ENCOUNTER — Ambulatory Visit: Payer: No Typology Code available for payment source | Admitting: Internal Medicine

## 2024-04-22 ENCOUNTER — Encounter: Payer: Self-pay | Admitting: Internal Medicine

## 2024-04-22 VITALS — BP 122/80 | HR 73 | Ht 72.0 in | Wt 139.0 lb

## 2024-04-22 DIAGNOSIS — E1165 Type 2 diabetes mellitus with hyperglycemia: Secondary | ICD-10-CM

## 2024-04-22 DIAGNOSIS — Z7984 Long term (current) use of oral hypoglycemic drugs: Secondary | ICD-10-CM | POA: Diagnosis not present

## 2024-04-22 DIAGNOSIS — E785 Hyperlipidemia, unspecified: Secondary | ICD-10-CM

## 2024-04-22 LAB — LIPID PANEL
Cholesterol: 167 mg/dL (ref <200)
HDL: 78 mg/dL (ref >=40)
LDL Cholesterol (Calc): 71 mg/dL
Non-HDL Cholesterol (Calc): 89 mg/dL (ref <130)
Total CHOL/HDL Ratio: 2.1 (calc) (ref <5.0)
Triglycerides: 101 mg/dL (ref <150)

## 2024-04-22 LAB — POCT GLUCOSE (DEVICE FOR HOME USE): POC Glucose: 130 mg/dL — AB (ref 70–99)

## 2024-04-22 LAB — POCT GLYCOSYLATED HEMOGLOBIN (HGB A1C): Hemoglobin A1C: 7.5 % — AB (ref 4.0–5.6)

## 2024-04-22 MED ORDER — SITAGLIPTIN PHOSPHATE 100 MG PO TABS: 100.0000 mg | ORAL_TABLET | Freq: Every day | ORAL | 3 refills | Status: AC

## 2024-04-22 MED ORDER — GLIMEPIRIDE 2 MG PO TABS: 2.0000 mg | ORAL_TABLET | Freq: Every day | ORAL | 3 refills | Status: DC

## 2024-04-22 NOTE — Patient Instructions (Signed)
 Take Januvia  100mg , 1 tablet daily  Take Glimepiride  2 mg, 1 tablet daily    HOW TO TREAT LOW BLOOD SUGARS (Blood sugar LESS THAN 70 MG/DL) Please follow the RULE OF 15 for the treatment of hypoglycemia treatment (when your (blood sugars are less than 70 mg/dL)   STEP 1: Take 15 grams of carbohydrates when your blood sugar is low, which includes:  3-4 GLUCOSE TABS  OR 3-4 OZ OF JUICE OR REGULAR SODA OR ONE TUBE OF GLUCOSE GEL    STEP 2: RECHECK blood sugar in 15 MINUTES STEP 3: If your blood sugar is still low at the 15 minute recheck --> then, go back to STEP 1 and treat AGAIN with another 15 grams of carbohydrates.

## 2024-04-22 NOTE — Progress Notes (Signed)
 Name: Anthony Henderson  MRN/ DOB: [id number], 07/20/1981   Age/ Sex: 43 y.o., male    PCP: Arellano Zameza, Priscila, MD   Reason for Endocrinology Evaluation: Type 2 Diabetes Mellitus     Date of Initial Endocrinology Visit: 03/04/2022    PATIENT IDENTIFIER: Anthony Henderson is a 43 y.o. male with a past medical history of PSVT, T2DM. The patient presented for initial endocrinology clinic visit on 03/04/2022 for consultative assistance with his diabetes management.    HPI: Mr. Lall was    Diagnosed with DM 2022 Prior Medications tried/Intolerance: He declines Metformin due to bad experience with family members. He was on Glipizide in 2022 but developed hypoglycemia.               Hemoglobin A1c has ranged from 5.9% in 2014, peaking at 7.1% in 2023.  On his initial visit to our clinic he had an A1c of 7.1% he was on Jardiance  only which we continued  Started Glimepiride  05/2023 with an A1c 7.6%     SUBJECTIVE:   During the last visit (12/18/2023): A1c 7.3%    Today (04/22/24): Anthony Henderson is here for follow-up on diabetes management.  He checks his blood sugars 2 times daily. The patient has not had hypoglycemic episodes since the last clinic visit   Denies nausea or vomiting unless rarely  Denies constipation or diarrhea    HOME DIABETES REGIMEN: Glimepiride  2 mg daily Atorvastatin  20 mg daily    Statin: yes  ACE-I/ARB: no    METER DOWNLOAD:  n/a    DIABETIC COMPLICATIONS: Microvascular complications:   Denies: CKD, retinopathy, neuropathy  Last eye exam: Completed   Macrovascular complications:   Denies: CAD, PVD, CVA   PAST HISTORY: Past Medical History:  Past Medical History:  Diagnosis Date  . Chronic leukopenia   . Ganglioglioma of brain (HCC)   . Hiatal hernia   . SVT (supraventricular tachycardia) [facility])    s/p ablation 03-10-14 by Dr Carolynne Citron   Past Surgical History:  Past Surgical History:  Procedure Laterality Date  . ABLATION   03/10/2014   RFCA of left posterior septal accessory pathyway by Dr Carolynne Citron with resultant atrial fibrillation  . SUPRAVENTRICULAR TACHYCARDIA ABLATION N/A 03/10/2014   Procedure: SUPRAVENTRICULAR TACHYCARDIA ABLATION;  Surgeon: Tammie Fall, MD;  Location: [facility] CATH LAB;  Service: Cardiovascular;  Laterality: N/A;    Social History:  reports that he quit smoking about 10 years ago. His smoking use included cigarettes. He started smoking about 20 years ago. He has a 2.5 pack-year smoking history. He has never used smokeless tobacco. He reports that he does not currently use alcohol. He reports that he does not use drugs. Family History:  Family History  Problem Relation Age of Onset  . Healthy Mother   . Healthy Father   . Diabetes Maternal Grandmother   . Colon cancer Neg Hx   . Esophageal cancer Neg Hx   . Rectal cancer Neg Hx   . Stomach cancer Neg Hx      HOME MEDICATIONS: Allergies as of 04/22/2024       Reactions   Penicillins Hives   Did it involve swelling of the face/tongue/throat, SOB, or low BP? No Did it involve sudden or severe rash/hives, skin peeling, or any reaction on the inside of your mouth or nose? Yes Did you need to seek medical attention at a hospital or doctor's office? No When did it last happen?      childhood If  all above answers are "NO", may proceed with cephalosporin use.        Medication List        Accurate as of Apr 22, 2024  2:06 PM. If you have any questions, ask your nurse or doctor.          Accu-Chek Guide Test test strip Generic drug: glucose blood TEST ONCE DAILY. USE AS INSTRUCTED   atorvastatin  20 MG tablet Commonly known as: LIPITOR Take 1 tablet (20 mg total) by mouth daily.   cyclobenzaprine  10 MG tablet Commonly known as: FLEXERIL  TAKE 1 TABLET BY MOUTH TWICE A DAY AS NEEDED FOR MUSCLE SPASMS   glimepiride  2 MG tablet Commonly known as: AMARYL  Take 1 tablet (2 mg total) by mouth daily before breakfast.    HYDROcodone -acetaminophen  5-325 MG tablet Commonly known as: NORCO/VICODIN Take 1 tablet by mouth every 6 (six) hours as needed for moderate pain (pain score 4-6) or severe pain (pain score 7-10).   naproxen  sodium 220 MG tablet Commonly known as: ALEVE  220 mg by oral route.         ALLERGIES: Allergies  Allergen Reactions  . Penicillins Hives    Did it involve swelling of the face/tongue/throat, SOB, or low BP? No Did it involve sudden or severe rash/hives, skin peeling, or any reaction on the inside of your mouth or nose? Yes Did you need to seek medical attention at a hospital or doctor's office? No When did it last happen?      childhood If all above answers are "NO", may proceed with cephalosporin use.     REVIEW OF SYSTEMS: A comprehensive ROS was conducted with the patient and is negative except as per HPI     OBJECTIVE:   VITAL SIGNS: BP 122/80 (BP Location: Left Arm, Patient Position: Sitting, Cuff Size: Normal)   Pulse 73   Ht 6' (1.829 m)   Wt 139 lb (63 kg)   SpO2 98%   BMI 18.85 kg/m    PHYSICAL EXAM:  General: Pt appears well and is in NAD  Lungs: Clear with good BS bilat   Heart: RRR  Extremities:  Lower extremities - No pretibial edema.   Neuro: MS is good with appropriate affect, pt is alert and Ox3   DM Foot Exam 04/22/2024  The skin of the feet is intact without sores or ulcerations. The pedal pulses are 2+ on right and 2+ on left. The sensation is intact to a screening 5.07, 10 gram monofilament bilaterally     DATA REVIEWED:  Lab Results  Component Value Date   HGBA1C 7.5 (A) 04/22/2024   HGBA1C 7.3 (A) 12/20/2023   HGBA1C 8.2 (A) 10/02/2023     Latest Reference Range & Units 04/22/24 14:26  Total CHOL/HDL Ratio <5.0 (calc) 2.1  Cholesterol <200 mg/dL 409  HDL Cholesterol > OR = 40 mg/dL 78  LDL Cholesterol (Calc) mg/dL (calc) 71  Non-HDL Cholesterol (Calc) <130 mg/dL (calc) 89  Triglycerides <150 mg/dL 811      Latest  Reference Range & Units 12/18/23 13:47  TSH 0.40 - 4.50 mIU/L 1.22  T4,Free(Direct) 0.8 - 1.8 ng/dL 1.0    Latest Reference Range & Units 12/18/23 13:47  Microalb, Ur mg/dL <9.1  MICROALB/CREAT RATIO <30 mg/g creat NOTE  Creatinine, Urine 20 - 320 mg/dL 478   In office BG 295 mg/dL    ASSESSMENT / PLAN / RECOMMENDATIONS:   1) Type 2 Diabetes Mellitus, Sub- optimally controlled, With out complications - Most recent  A1c of 7.5%. Goal A1c <7.0%.     -A1c continues to be above goal -He had declined metformin use in the past due to negative experience 2 family members -He also does not want any medications that would cause weight loss -Jardiance  was cost prohibitive - I have recommended Januvia   MEDICATIONS:  Start Januvia  100 mg daily Continue glimepiride  2 mg daily   EDUCATION / INSTRUCTIONS: BG monitoring instructions: Patient is instructed to check his blood sugars 3 times a week Call [facility] if: BG persistently < 70  I reviewed the Rule of 15 for the treatment of hypoglycemia in detail with the patient. Literature supplied.   2) Diabetic complications:  Eye: Does not have known diabetic retinopathy.  Neuro/ Feet: Does not have known diabetic peripheral neuropathy. Renal: Patient does not have known baseline CKD. He is not on an ACEI/ARB at present.   3) Dyslipidemia:   - His LDL was above goal, I switched pravastatin  to atorvastatin  in July 2023 with improvement in his LDL  - Lipid panel is optimal  Medication  Continue atorvastatin  20 mg daily   Follow-up in 6 months  Signed electronically by: Natale Bail, MD  [facility] Endocrinology  [facility] [address] [place] [place], [place] [address] Phone: [phone] FAX: [phone]   CC: Sonja Saucier, MD [address] [place] [address] Phone: [phone]  Fax: [phone]    Return to Endocrinology clinic as below: No future  appointments.

## 2024-04-23 ENCOUNTER — Ambulatory Visit: Payer: Self-pay | Admitting: Internal Medicine

## 2024-04-23 MED ORDER — ATORVASTATIN CALCIUM 20 MG PO TABS: 20.0000 mg | ORAL_TABLET | Freq: Every day | ORAL | 3 refills | Status: AC

## 2024-04-23 MED ORDER — GLIMEPIRIDE 2 MG PO TABS: 2.0000 mg | ORAL_TABLET | Freq: Every day | ORAL | 3 refills | Status: AC

## 2024-08-26 ENCOUNTER — Ambulatory Visit: Admitting: Internal Medicine

## 2024-08-26 NOTE — Progress Notes (Deleted)
 Name: Anthony Henderson  MRN/ DOB: [id number], [birth year]   Age/ Sex: 43 y.o., male    PCP: Edgardo Pontiff, DO   Reason for Endocrinology Evaluation: Type 2 Diabetes Mellitus     Date of Initial Endocrinology Visit: 03/04/2022    PATIENT IDENTIFIER: [name] is a 43 y.o. male with a past medical history of PSVT, T2DM. The patient presented for initial endocrinology clinic visit on 03/04/2022 for consultative assistance with his diabetes management.    HPI: Anthony Henderson was    Diagnosed with DM 2022 Prior Medications tried/Intolerance: He declines Metformin due to bad experience with family members. He was on Glipizide in 2022 but developed hypoglycemia.               Hemoglobin A1c has ranged from 5.9% in 2014, peaking at 7.1% in 2023.  On his initial visit to our clinic he had an A1c of 7.1% he was on Jardiance  only which we continued  Started Glimepiride  05/2023 with an A1c 7.6%   Started Januvia  May, 2025 with an A1c of 7.5%  SUBJECTIVE:   During the last visit (04/22/2024): A1c 7.5%    Today (08/26/24): Anthony Henderson is here for follow-up on diabetes management.  He checks his blood sugars 2 times daily. The patient has not had hypoglycemic episodes since the last clinic visit   Denies nausea or vomiting unless rarely  Denies constipation or diarrhea    HOME DIABETES REGIMEN: Glimepiride  2 mg daily Januvia  100 mg daily Atorvastatin  20 mg daily    Statin: yes  ACE-I/ARB: no    METER DOWNLOAD:  n/a    DIABETIC COMPLICATIONS: Microvascular complications:   Denies: CKD, retinopathy, neuropathy  Last eye exam: Completed   Macrovascular complications:   Denies: CAD, PVD, CVA   PAST HISTORY: Past Medical History:  Past Medical History:  Diagnosis Date   Chronic leukopenia    Ganglioglioma of brain (HCC)    Hiatal hernia    SVT (supraventricular tachycardia) (HCC)    s/p ablation 03-10-14 by Dr Waddell   Past Surgical History:  Past Surgical  History:  Procedure Laterality Date   ABLATION  03/10/2014   RFCA of left posterior septal accessory pathyway by Dr Waddell with resultant atrial fibrillation   SUPRAVENTRICULAR TACHYCARDIA ABLATION N/A 03/10/2014   Procedure: SUPRAVENTRICULAR TACHYCARDIA ABLATION;  Surgeon: Danelle LELON Waddell, MD;  Location: [facility] CATH LAB;  Service: Cardiovascular;  Laterality: N/A;    Social History:  reports that he quit smoking about 10 years ago. His smoking use included cigarettes. He started smoking about 20 years ago. He has a 2.5 pack-year smoking history. He has never used smokeless tobacco. He reports that he does not currently use alcohol. He reports that he does not use drugs. Family History:  Family History  Problem Relation Age of Onset   Healthy Mother    Healthy Father    Diabetes Maternal Grandmother    Colon cancer Neg Hx    Esophageal cancer Neg Hx    Rectal cancer Neg Hx    Stomach cancer Neg Hx      HOME MEDICATIONS: Allergies as of 08/26/2024       Reactions   Penicillins Hives   Did it involve swelling of the face/tongue/throat, SOB, or low BP? No Did it involve sudden or severe rash/hives, skin peeling, or any reaction on the inside of your mouth or nose? Yes Did you need to seek medical attention at a hospital or doctor's office? No When  did it last happen?      childhood If all above answers are "NO", may proceed with cephalosporin use.        Medication List        Accurate as of August 26, 2024  7:24 AM. If you have any questions, ask your nurse or doctor.          Accu-Chek Guide Test test strip Generic drug: glucose blood TEST ONCE DAILY. USE AS INSTRUCTED   atorvastatin  20 MG tablet Commonly known as: LIPITOR Take 1 tablet (20 mg total) by mouth daily.   cyclobenzaprine  10 MG tablet Commonly known as: FLEXERIL  TAKE 1 TABLET BY MOUTH TWICE A DAY AS NEEDED FOR MUSCLE SPASMS   glimepiride  2 MG tablet Commonly known as: AMARYL  Take 1 tablet (2 mg total) by  mouth daily before breakfast.   HYDROcodone -acetaminophen  5-325 MG tablet Commonly known as: NORCO/VICODIN Take 1 tablet by mouth every 6 (six) hours as needed for moderate pain (pain score 4-6) or severe pain (pain score 7-10).   naproxen  sodium 220 MG tablet Commonly known as: ALEVE  220 mg by oral route.   sitaGLIPtin  100 MG tablet Commonly known as: Januvia  Take 1 tablet (100 mg total) by mouth daily.         ALLERGIES: Allergies  Allergen Reactions   Penicillins Hives    Did it involve swelling of the face/tongue/throat, SOB, or low BP? No Did it involve sudden or severe rash/hives, skin peeling, or any reaction on the inside of your mouth or nose? Yes Did you need to seek medical attention at a hospital or doctor's office? No When did it last happen?      childhood If all above answers are "NO", may proceed with cephalosporin use.     REVIEW OF SYSTEMS: A comprehensive ROS was conducted with the patient and is negative except as per HPI     OBJECTIVE:   VITAL SIGNS: There were no vitals taken for this visit.   PHYSICAL EXAM:  General: Pt appears well and is in NAD  Lungs: Clear with good BS bilat   Heart: RRR  Extremities:  Lower extremities - No pretibial edema.   Neuro: MS is good with appropriate affect, pt is alert and Ox3   DM Foot Exam 04/22/2024  The skin of the feet is intact without sores or ulcerations. The pedal pulses are 2+ on right and 2+ on left. The sensation is intact to a screening 5.07, 10 gram monofilament bilaterally     DATA REVIEWED:  Lab Results  Component Value Date   HGBA1C 7.5 (A) 04/22/2024   HGBA1C 7.3 (A) 12/20/2023   HGBA1C 8.2 (A) 10/02/2023     Latest Reference Range & Units 04/22/24 14:26  Total CHOL/HDL Ratio <5.0 (calc) 2.1  Cholesterol <200 mg/dL 832  HDL Cholesterol > OR = 40 mg/dL 78  LDL Cholesterol (Calc) mg/dL (calc) 71  Non-HDL Cholesterol (Calc) <130 mg/dL (calc) 89  Triglycerides <150 mg/dL 898       Latest Reference Range & Units 12/18/23 13:47  TSH 0.40 - 4.50 mIU/L 1.22  T4,Free(Direct) 0.8 - 1.8 ng/dL 1.0    Latest Reference Range & Units 12/18/23 13:47  Microalb, Ur mg/dL <9.7  MICROALB/CREAT RATIO <30 mg/g creat NOTE  Creatinine, Urine 20 - 320 mg/dL 870   In office BG 869 mg/dL    ASSESSMENT / PLAN / RECOMMENDATIONS:   1) Type 2 Diabetes Mellitus, Sub- optimally controlled, With out complications - Most recent A1c  of 7.5%. Goal A1c <7.0%.     -A1c continues to be above goal -He had declined metformin use in the past due to negative experience 2 family members -He also does not want any medications that would cause weight loss -Jardiance  was cost prohibitive - I have recommended Januvia   MEDICATIONS:  Start Januvia  100 mg daily Continue glimepiride  2 mg daily   EDUCATION / INSTRUCTIONS: BG monitoring instructions: Patient is instructed to check his blood sugars 3 times a week Call  Endocrinology clinic if: BG persistently < 70  I reviewed the Rule of 15 for the treatment of hypoglycemia in detail with the patient. Literature supplied.   2) Diabetic complications:  Eye: Does not have known diabetic retinopathy.  Neuro/ Feet: Does not have known diabetic peripheral neuropathy. Renal: Patient does not have known baseline CKD. He is not on an ACEI/ARB at present.   3) Dyslipidemia:   - His LDL was above goal, I switched pravastatin  to atorvastatin  in July 2023 with improvement in his LDL  - Lipid panel is optimal  Medication  Continue atorvastatin  20 mg daily   Follow-up in 6 months  Signed electronically by: Stefano Redgie Butts, MD  [facility] Endocrinology  [facility] [address] [place] [place], [place] [address] Phone: [phone] FAX: [phone]   CC: Edgardo Pontiff, DO [address] [place] [address] [place] [address] Phone: [phone]  Fax: [phone]    Return to Endocrinology clinic as  below: Future Appointments  Date Time Provider Department Center  08/26/2024 11:50 AM Elsworth Ledin, Donell Redgie, MD [id number] None

## 2024-10-21 ENCOUNTER — Telehealth: Payer: Self-pay | Admitting: *Deleted

## 2024-10-21 NOTE — Telephone Encounter (Signed)
 Pt overdue for visit Attempted to contact pt with an appt No answer, message left on recorder No further action needed at this time   Will await call back from pt
